# Patient Record
Sex: Female | Born: 1935 | Race: White | Hispanic: No | Smoking: Former smoker
Health system: Southern US, Community
[De-identification: ages and names within clinical notes are randomized; demographics above are authoritative.]

## PROBLEM LIST (undated history)

## (undated) DIAGNOSIS — T7840XA Allergy, unspecified, initial encounter: Secondary | ICD-10-CM

## (undated) DIAGNOSIS — E119 Type 2 diabetes mellitus without complications: Secondary | ICD-10-CM

## (undated) DIAGNOSIS — G4730 Sleep apnea, unspecified: Secondary | ICD-10-CM

## (undated) DIAGNOSIS — M1990 Unspecified osteoarthritis, unspecified site: Secondary | ICD-10-CM

## (undated) DIAGNOSIS — M109 Gout, unspecified: Secondary | ICD-10-CM

## (undated) DIAGNOSIS — F32A Depression, unspecified: Secondary | ICD-10-CM

## (undated) DIAGNOSIS — E039 Hypothyroidism, unspecified: Secondary | ICD-10-CM

## (undated) DIAGNOSIS — I509 Heart failure, unspecified: Secondary | ICD-10-CM

## (undated) DIAGNOSIS — F329 Major depressive disorder, single episode, unspecified: Secondary | ICD-10-CM

## (undated) DIAGNOSIS — K5792 Diverticulitis of intestine, part unspecified, without perforation or abscess without bleeding: Secondary | ICD-10-CM

## (undated) DIAGNOSIS — K635 Polyp of colon: Secondary | ICD-10-CM

## (undated) DIAGNOSIS — G510 Bell's palsy: Secondary | ICD-10-CM

## (undated) HISTORY — DX: Gout, unspecified: M10.9

## (undated) HISTORY — DX: Allergy, unspecified, initial encounter: T78.40XA

## (undated) HISTORY — PX: HERNIA REPAIR: SHX51

## (undated) HISTORY — DX: Sleep apnea, unspecified: G47.30

## (undated) HISTORY — PX: CHOLECYSTECTOMY: SHX55

## (undated) HISTORY — PX: ABDOMINAL HYSTERECTOMY: SHX81

## (undated) HISTORY — DX: Type 2 diabetes mellitus without complications: E11.9

## (undated) HISTORY — DX: Heart failure, unspecified: I50.9

---

## 2016-09-06 ENCOUNTER — Other Ambulatory Visit: Payer: Self-pay | Admitting: Geriatric Medicine

## 2016-09-06 DIAGNOSIS — Z1239 Encounter for other screening for malignant neoplasm of breast: Secondary | ICD-10-CM

## 2016-11-24 ENCOUNTER — Encounter: Payer: Self-pay | Admitting: Emergency Medicine

## 2016-11-24 ENCOUNTER — Inpatient Hospital Stay
Admission: EM | Admit: 2016-11-24 | Discharge: 2016-11-27 | DRG: 871 | Disposition: A | Discharge status: Nursing Home (Includes ICF) | Payer: Medicare Other | Attending: Internal Medicine | Admitting: Internal Medicine

## 2016-11-24 ENCOUNTER — Emergency Department: Payer: Medicare Other

## 2016-11-24 DIAGNOSIS — Z881 Allergy status to other antibiotic agents status: Secondary | ICD-10-CM

## 2016-11-24 DIAGNOSIS — E119 Type 2 diabetes mellitus without complications: Secondary | ICD-10-CM | POA: Diagnosis present

## 2016-11-24 DIAGNOSIS — R21 Rash and other nonspecific skin eruption: Secondary | ICD-10-CM | POA: Diagnosis present

## 2016-11-24 DIAGNOSIS — F329 Major depressive disorder, single episode, unspecified: Secondary | ICD-10-CM | POA: Diagnosis present

## 2016-11-24 DIAGNOSIS — Z888 Allergy status to other drugs, medicaments and biological substances status: Secondary | ICD-10-CM | POA: Diagnosis not present

## 2016-11-24 DIAGNOSIS — N179 Acute kidney failure, unspecified: Secondary | ICD-10-CM | POA: Diagnosis present

## 2016-11-24 DIAGNOSIS — E876 Hypokalemia: Secondary | ICD-10-CM | POA: Diagnosis present

## 2016-11-24 DIAGNOSIS — Z885 Allergy status to narcotic agent status: Secondary | ICD-10-CM

## 2016-11-24 DIAGNOSIS — I248 Other forms of acute ischemic heart disease: Secondary | ICD-10-CM | POA: Diagnosis present

## 2016-11-24 DIAGNOSIS — Y95 Nosocomial condition: Secondary | ICD-10-CM | POA: Diagnosis present

## 2016-11-24 DIAGNOSIS — J9601 Acute respiratory failure with hypoxia: Secondary | ICD-10-CM | POA: Diagnosis present

## 2016-11-24 DIAGNOSIS — R778 Other specified abnormalities of plasma proteins: Secondary | ICD-10-CM

## 2016-11-24 DIAGNOSIS — J189 Pneumonia, unspecified organism: Secondary | ICD-10-CM | POA: Diagnosis present

## 2016-11-24 DIAGNOSIS — E785 Hyperlipidemia, unspecified: Secondary | ICD-10-CM | POA: Diagnosis present

## 2016-11-24 DIAGNOSIS — T368X5A Adverse effect of other systemic antibiotics, initial encounter: Secondary | ICD-10-CM | POA: Diagnosis not present

## 2016-11-24 DIAGNOSIS — Z91041 Radiographic dye allergy status: Secondary | ICD-10-CM | POA: Diagnosis not present

## 2016-11-24 DIAGNOSIS — M6281 Muscle weakness (generalized): Secondary | ICD-10-CM

## 2016-11-24 DIAGNOSIS — Z87891 Personal history of nicotine dependence: Secondary | ICD-10-CM | POA: Diagnosis not present

## 2016-11-24 DIAGNOSIS — R7989 Other specified abnormal findings of blood chemistry: Secondary | ICD-10-CM

## 2016-11-24 DIAGNOSIS — E860 Dehydration: Secondary | ICD-10-CM | POA: Diagnosis present

## 2016-11-24 DIAGNOSIS — Z66 Do not resuscitate: Secondary | ICD-10-CM | POA: Diagnosis present

## 2016-11-24 DIAGNOSIS — A419 Sepsis, unspecified organism: Secondary | ICD-10-CM | POA: Diagnosis present

## 2016-11-24 DIAGNOSIS — E039 Hypothyroidism, unspecified: Secondary | ICD-10-CM | POA: Diagnosis present

## 2016-11-24 HISTORY — DX: Type 2 diabetes mellitus without complications: E11.9

## 2016-11-24 HISTORY — DX: Unspecified osteoarthritis, unspecified site: M19.90

## 2016-11-24 HISTORY — DX: Diverticulitis of intestine, part unspecified, without perforation or abscess without bleeding: K57.92

## 2016-11-24 LAB — COMPREHENSIVE METABOLIC PANEL
ALT: 23 U/L (ref 14–54)
AST: 45 U/L — AB (ref 15–41)
Albumin: 3.6 g/dL (ref 3.5–5.0)
Alkaline Phosphatase: 59 U/L (ref 38–126)
Anion gap: 11 (ref 5–15)
BILIRUBIN TOTAL: 1.2 mg/dL (ref 0.3–1.2)
BUN: 35 mg/dL — AB (ref 6–20)
CO2: 27 mmol/L (ref 22–32)
CREATININE: 1.42 mg/dL — AB (ref 0.44–1.00)
Calcium: 9.3 mg/dL (ref 8.9–10.3)
Chloride: 97 mmol/L — ABNORMAL LOW (ref 101–111)
GFR, EST AFRICAN AMERICAN: 39 mL/min — AB (ref >60)
GFR, EST NON AFRICAN AMERICAN: 34 mL/min — AB (ref >60)
Glucose, Bld: 256 mg/dL — ABNORMAL HIGH (ref 65–99)
POTASSIUM: 3.3 mmol/L — AB (ref 3.5–5.1)
Sodium: 135 mmol/L (ref 135–145)
TOTAL PROTEIN: 7.4 g/dL (ref 6.5–8.1)

## 2016-11-24 LAB — CBC WITH DIFFERENTIAL/PLATELET
Basophils Absolute: 0 10*3/uL (ref 0–0.1)
Basophils Relative: 1 %
EOS ABS: 0.1 10*3/uL (ref 0–0.7)
EOS PCT: 1 %
HCT: 37.4 % (ref 35.0–47.0)
Hemoglobin: 13.1 g/dL (ref 12.0–16.0)
LYMPHS ABS: 1 10*3/uL (ref 1.0–3.6)
Lymphocytes Relative: 17 %
MCH: 31.2 pg (ref 26.0–34.0)
MCHC: 34.9 g/dL (ref 32.0–36.0)
MCV: 89.2 fL (ref 80.0–100.0)
MONO ABS: 0.8 10*3/uL (ref 0.2–0.9)
MONOS PCT: 12 %
Neutro Abs: 4.2 10*3/uL (ref 1.4–6.5)
Neutrophils Relative %: 69 %
PLATELETS: 126 10*3/uL — AB (ref 150–440)
RBC: 4.19 MIL/uL (ref 3.80–5.20)
RDW: 16 % — AB (ref 11.5–14.5)
WBC: 6.1 10*3/uL (ref 3.6–11.0)

## 2016-11-24 LAB — PROTIME-INR
INR: 1.2
Prothrombin Time: 15.3 seconds — ABNORMAL HIGH (ref 11.4–15.2)

## 2016-11-24 LAB — LIPASE, BLOOD: LIPASE: 18 U/L (ref 11–51)

## 2016-11-24 LAB — INFLUENZA PANEL BY PCR (TYPE A & B)
Influenza A By PCR: NEGATIVE
Influenza B By PCR: NEGATIVE

## 2016-11-24 LAB — LACTIC ACID, PLASMA: Lactic Acid, Venous: 1.2 mmol/L (ref 0.5–1.9)

## 2016-11-24 LAB — TROPONIN I: TROPONIN I: 0.06 ng/mL — AB (ref <0.03)

## 2016-11-24 LAB — GLUCOSE, CAPILLARY: Glucose-Capillary: 205 mg/dL — ABNORMAL HIGH (ref 65–99)

## 2016-11-24 MED ORDER — SENNOSIDES-DOCUSATE SODIUM 8.6-50 MG PO TABS: 1.0000 | ORAL_TABLET | Freq: Every evening | ORAL | Status: DC | PRN

## 2016-11-24 MED ORDER — SODIUM CHLORIDE 0.9 % IV BOLUS (SEPSIS)
250.0000 mL | Freq: Once | INTRAVENOUS | Status: AC
  Administered 2016-11-24: 250 mL via INTRAVENOUS

## 2016-11-24 MED ORDER — ACETAMINOPHEN 500 MG PO TABS
1000.0000 mg | ORAL_TABLET | Freq: Once | ORAL | Status: AC
  Administered 2016-11-24: 1000 mg via ORAL

## 2016-11-24 MED ORDER — POTASSIUM CHLORIDE 20 MEQ/15ML (10%) PO SOLN
40.0000 meq | Freq: Once | ORAL | Status: DC
  Filled 2016-11-24: qty 30

## 2016-11-24 MED ORDER — ACETAMINOPHEN 650 MG RE SUPP: 650.0000 mg | Freq: Four times a day (QID) | RECTAL | Status: DC | PRN

## 2016-11-24 MED ORDER — VANCOMYCIN HCL IN DEXTROSE 1-5 GM/200ML-% IV SOLN
1000.0000 mg | Freq: Once | INTRAVENOUS | Status: AC
  Administered 2016-11-24: 1000 mg via INTRAVENOUS
  Filled 2016-11-24: qty 200

## 2016-11-24 MED ORDER — ENOXAPARIN SODIUM 30 MG/0.3ML ~~LOC~~ SOLN
30.0000 mg | SUBCUTANEOUS | Status: DC
  Administered 2016-11-24: 30 mg via SUBCUTANEOUS
  Filled 2016-11-24: qty 0.3

## 2016-11-24 MED ORDER — SODIUM CHLORIDE 0.9 % IV SOLN
INTRAVENOUS | Status: DC
  Administered 2016-11-25 (×2): via INTRAVENOUS

## 2016-11-24 MED ORDER — DEXTROSE 5 % IV SOLN
1.0000 g | Freq: Three times a day (TID) | INTRAVENOUS | Status: DC
  Administered 2016-11-25: 1 g via INTRAVENOUS
  Filled 2016-11-24 (×3): qty 1

## 2016-11-24 MED ORDER — SODIUM CHLORIDE 0.9 % IV BOLUS (SEPSIS)
1000.0000 mL | Freq: Once | INTRAVENOUS | Status: AC
  Administered 2016-11-24: 1000 mL via INTRAVENOUS

## 2016-11-24 MED ORDER — ACETAMINOPHEN 325 MG PO TABS
650.0000 mg | ORAL_TABLET | Freq: Four times a day (QID) | ORAL | Status: DC | PRN
Start: 2016-11-24 — End: 2016-11-27
  Administered 2016-11-25 – 2016-11-27 (×4): 650 mg via ORAL
  Filled 2016-11-24 (×4): qty 2

## 2016-11-24 MED ORDER — FENOFIBRATE 160 MG PO TABS
160.0000 mg | ORAL_TABLET | Freq: Every day | ORAL | Status: DC
  Administered 2016-11-25 – 2016-11-27 (×3): 160 mg via ORAL
  Filled 2016-11-24 (×3): qty 1

## 2016-11-24 MED ORDER — LEVOTHYROXINE SODIUM 50 MCG PO TABS
50.0000 ug | ORAL_TABLET | Freq: Every day | ORAL | Status: DC
  Administered 2016-11-25 – 2016-11-27 (×3): 50 ug via ORAL
  Filled 2016-11-24 (×3): qty 1

## 2016-11-24 MED ORDER — SODIUM CHLORIDE 0.9 % IV BOLUS (SEPSIS)
1000.0000 mL | Freq: Once | INTRAVENOUS | Status: AC
Start: 2016-11-24 — End: 2016-11-24
  Administered 2016-11-24: 1000 mL via INTRAVENOUS

## 2016-11-24 MED ORDER — ALBUTEROL SULFATE (2.5 MG/3ML) 0.083% IN NEBU
5.0000 mg | INHALATION_SOLUTION | Freq: Once | RESPIRATORY_TRACT | Status: AC
  Administered 2016-11-24: 5 mg via RESPIRATORY_TRACT
  Filled 2016-11-24: qty 6

## 2016-11-24 MED ORDER — TRAMADOL HCL 50 MG PO TABS
50.0000 mg | ORAL_TABLET | Freq: Four times a day (QID) | ORAL | Status: DC | PRN
  Administered 2016-11-25 – 2016-11-26 (×2): 50 mg via ORAL
  Filled 2016-11-24 (×2): qty 1

## 2016-11-24 MED ORDER — DEXTROSE 5 % IV SOLN
500.0000 mg | Freq: Once | INTRAVENOUS | Status: AC
  Administered 2016-11-24: 500 mg via INTRAVENOUS
  Filled 2016-11-24: qty 500

## 2016-11-24 MED ORDER — ONDANSETRON HCL 4 MG PO TABS
4.0000 mg | ORAL_TABLET | Freq: Four times a day (QID) | ORAL | Status: DC | PRN
Start: 2016-11-24 — End: 2016-11-27

## 2016-11-24 MED ORDER — DULOXETINE HCL 60 MG PO CPEP
60.0000 mg | ORAL_CAPSULE | Freq: Every day | ORAL | Status: DC
  Administered 2016-11-25 – 2016-11-27 (×3): 60 mg via ORAL
  Filled 2016-11-24 (×3): qty 1

## 2016-11-24 MED ORDER — ONDANSETRON HCL 4 MG/2ML IJ SOLN: 4.0000 mg | Freq: Four times a day (QID) | INTRAMUSCULAR | Status: DC | PRN

## 2016-11-24 MED ORDER — INSULIN DETEMIR 100 UNIT/ML ~~LOC~~ SOLN
20.0000 [IU] | Freq: Every day | SUBCUTANEOUS | Status: DC
  Administered 2016-11-24 – 2016-11-26 (×3): 20 [IU] via SUBCUTANEOUS
  Filled 2016-11-24 (×4): qty 0.2

## 2016-11-24 MED ORDER — ACETAMINOPHEN 500 MG PO TABS
ORAL_TABLET | ORAL | Status: AC
  Administered 2016-11-24: 1000 mg via ORAL
  Filled 2016-11-24: qty 2

## 2016-11-24 MED ORDER — DEXTROSE 5 % IV SOLN
2.0000 g | Freq: Once | INTRAVENOUS | Status: AC
  Administered 2016-11-24: 2 g via INTRAVENOUS
  Filled 2016-11-24: qty 2

## 2016-11-24 NOTE — Progress Notes (Signed)
Pharmacy Antibiotic Note  Sabrina Small is a 82 y.o. female admitted on 11/24/2016 with pneumonia.  Pharmacy has been consulted for aztreonam dosing.  Plan: Aztreonam 2 g IV once given in ED  Will order aztreonam 1 g IV q8h per renal function and indication  Height: 4\' 11"  (149.9 cm) Weight: 163 lb 11.2 oz (74.3 kg) IBW/kg (Calculated) : 43.2  Temp (24hrs), Avg:99.7 F (37.6 C), Min:98.2 F (36.8 C), Max:101.9 F (38.8 C)   Recent Labs Lab 11/24/16 1613  WBC 6.1  CREATININE 1.42*  LATICACIDVEN 1.2    Estimated Creatinine Clearance: 27.3 mL/min (by C-G formula based on SCr of 1.42 mg/dL (H)).    Allergies  Allergen Reactions  . Doxycycline   . Glipizide   . Iodinated Diagnostic Agents   . Lortab [Hydrocodone-Acetaminophen]   . Morphine And Related   . Omnicef [Cefdinir]   . Penicillins Other (See Comments)    "I blow up and pass out" Over 10 yrs ago, thinks she did go to the hospital, but didn't remember if she was admitted. No skin necrosis.   Marland Kitchen Percocet [Oxycodone-Acetaminophen]   . Rosuvastatin   . Sulfa Antibiotics   . Trovafloxacin   . Voltaren [Diclofenac Sodium]   . Zolpidem    Microbiology results: 12/20 BCx: Sent 12/20 UCx: Sent    Thank you for allowing pharmacy to be a part of this patient's care.  Lenis Noon, PharmD, BCPS Clinical Pharmacist 11/24/2016 8:03 PM

## 2016-11-24 NOTE — ED Provider Notes (Signed)
[facility] Emergency Department Provider Note  ____________________________________________   First MD Initiated Contact with Patient 11/24/16 1546     (approximate)  I have reviewed the triage vital signs and the nursing notes.   HISTORY  Chief Complaint Shortness of Breath    HPI Sabrina Small is a 82 y.o. female who presents for evaluation of fever, cough, and shortness of breath that has been gradually worsening for 4 days.  The symptoms are now severe in any amount of exertion makes it worse.  Nothing makes it better.  She states that it started with more of an upper respiratory issue with nasal congestion and runny nose and a mild sore throat.  Those symptoms have improved but now she has difficulty breathing, productive cough, and fever/chills.  He was seen by doctors making house calls last night and antibiotics were prescribed but she has not started on any yet.  Her symptoms were worse today and the patient has a lot of listed allergies to most types of antibiotics so that was one of the hesitations for starting her on antibiotics as an outpatient.  Reportedly the pneumonia was documented on a chest x-ray performed last night.  She denies chest pain except for when she coughs, abdominal pain, nausea, vomiting, dysuria.   Past Medical History:  Diagnosis Date  . Arthritis   . Diabetes mellitus without complication ([place])   . Diverticulitis     Patient Active Problem List   Diagnosis Date Noted  . Sepsis ([place]) 11/24/2016    Past Surgical History:  Procedure Laterality Date  . ABDOMINAL HYSTERECTOMY    . CHOLECYSTECTOMY      Prior to Admission medications   Not on File    Allergies Doxycycline; Glipizide; Iodinated diagnostic agents; Lortab [hydrocodone-acetaminophen]; Morphine and related; Omnicef [cefdinir]; Penicillins; Percocet [oxycodone-acetaminophen]; Rosuvastatin; Sulfa antibiotics; Trovafloxacin; Voltaren [diclofenac sodium];  and Zolpidem  No family history on file.  Social History Social History  Substance Use Topics  . Smoking status: Former Research scientist (life sciences)  . Smokeless tobacco: Never Used  . Alcohol use No    Review of Systems Constitutional: +fever/chills Eyes: No visual changes. ENT: No sore throat. Cardiovascular: Denies chest pain. Respiratory: +shortness of breath with productive cough Gastrointestinal: No abdominal pain.  No nausea, no vomiting.  No diarrhea.  No constipation. Genitourinary: Negative for dysuria. Musculoskeletal: Negative for back pain. Skin: Negative for rash. Neurological: Negative for headaches, focal weakness or numbness.  10-point ROS otherwise negative.  ____________________________________________   PHYSICAL EXAM:  VITAL SIGNS: ED Triage Vitals  Enc Vitals Group     BP 11/24/16 1529 138/62     Pulse Rate 11/24/16 1529 94     Resp 11/24/16 1529 20     Temp 11/24/16 1529 (!) 101.9 F (38.8 C)     Temp Source 11/24/16 1529 Oral     SpO2 11/24/16 1529 91 %     Weight 11/24/16 1521 154 lb (69.9 kg)     Height 11/24/16 1521 4\' 11"  (1.499 m)     Head Circumference --      Peak Flow --      Pain Score 11/24/16 1521 8     Pain Loc --      Pain Edu? --      Excl. in [place]? --     Constitutional: Alert and oriented but ill-appearing Eyes: Conjunctivae are normal. PERRL. EOMI. Head: Atraumatic. Nose: No congestion/rhinnorhea. Mouth/Throat: Mucous membranes are moist.  Oropharynx non-erythematous. Neck: No stridor.  No meningeal signs.   Cardiovascular: Normal rate, regular rhythm. Good peripheral circulation. Grossly normal heart sounds. Respiratory: Increased respiratory effort.  No retractions.  Coarse breath sounds bilaterally, slightly worse on the right.  Frequent productive cough Gastrointestinal: Soft and nontender. No distention.  Musculoskeletal: No lower extremity tenderness nor edema. No gross deformities of extremities. Neurologic:  Normal speech and  language. No gross focal neurologic deficits are appreciated.  Skin:  Skin is warm, dry and intact. No rash noted. Psychiatric: Mood and affect are normal. Speech and behavior are normal.  ____________________________________________   LABS (all labs ordered are listed, but only abnormal results are displayed)  Labs Reviewed  COMPREHENSIVE METABOLIC PANEL - Abnormal; Notable for the following:       Result Value   Potassium 3.3 (*)    Chloride 97 (*)    Glucose, Bld 256 (*)    BUN 35 (*)    Creatinine, Ser 1.42 (*)    AST 45 (*)    GFR calc non Af Amer 34 (*)    GFR calc Af Amer 39 (*)    All other components within normal limits  PROTIME-INR - Abnormal; Notable for the following:    Prothrombin Time 15.3 (*)    All other components within normal limits  CBC WITH DIFFERENTIAL/PLATELET - Abnormal; Notable for the following:    RDW 16.0 (*)    Platelets 126 (*)    All other components within normal limits  TROPONIN I - Abnormal; Notable for the following:    Troponin I 0.06 (*)    All other components within normal limits  CULTURE, BLOOD (ROUTINE X 2)  CULTURE, BLOOD (ROUTINE X 2)  URINE CULTURE  LACTIC ACID, PLASMA  LIPASE, BLOOD  LACTIC ACID, PLASMA  INFLUENZA PANEL BY PCR (TYPE A & B, H1N1)   ____________________________________________  EKG  ED ECG REPORT I, Kharizma Lesnick, the attending physician, personally viewed and interpreted this ECG.  Date: 11/24/2016 EKG Time: 15:38 Rate: 93 Rhythm: normal sinus rhythm with occasional PVC QRS Axis: normal Intervals: normal ST/T Wave abnormalities: Non-specific ST segment / T-wave changes, but no evidence of acute ischemia. Conduction Disturbances: none Narrative Interpretation: unremarkable  ____________________________________________  RADIOLOGY   Dg Chest 2 View  Result Date: 11/24/2016 CLINICAL DATA:  Shortness of breath and cough for the past week ; history of diabetes; former smoker. EXAM: CHEST  2 VIEW  COMPARISON:  None in PACs FINDINGS: The lungs are adequately inflated. The interstitial markings are coarse. Confluent interstitial density is present at the right lung base likely posteriorly. There is a small amount of pleural fluid blunting the costophrenic angles. The cardiac silhouette is enlarged. The central pulmonary vascularity is prominent. There is calcification in the wall of the aortic arch. There is multilevel degenerative disc disease of the thoracic spine. IMPRESSION: Chronic bronchitic -smoking related changes. Superimposed interstitial and early alveolar pneumonia especially in the right lower lobe. Followup PA and lateral chest X-ray is recommended in 3-4 weeks following trial of antibiotic therapy to ensure resolution and exclude underlying malignancy. Cardiomegaly with mild central pulmonary vascular prominence but no definite pulmonary edema. Thoracic aortic atherosclerosis. Electronically Signed   By: David  Martinique M.D.   On: 11/24/2016 16:14    ____________________________________________   PROCEDURES  Procedure(s) performed:   .Critical Care Performed by: Hinda Kehr Authorized by: Hinda Kehr   Critical care provider statement:    Critical care time (minutes):  30   Critical care time was exclusive of:  Separately  billable procedures and treating other patients   Critical care was necessary to treat or prevent imminent or life-threatening deterioration of the following conditions:  Sepsis   Critical care was time spent personally by me on the following activities:  Development of treatment plan with patient or surrogate, discussions with consultants, evaluation of patient's response to treatment, examination of patient, obtaining history from patient or surrogate, ordering and performing treatments and interventions, ordering and review of laboratory studies, ordering and review of radiographic studies, pulse oximetry, re-evaluation of patient's condition and review of  old charts     Critical Care performed: Yes, see critical care procedure note(s) ____________________________________________   INITIAL IMPRESSION / [place] / ED COURSE  Pertinent labs & imaging results that were available during my care of the patient were reviewed by me and considered in my medical decision making (see chart for details).  Based on the patient's vital signs I activated code sepsis.  We will proceed with empiric antibiotics though she has a number of listed allergies.  She is not certain about any of the interactions etc. she was certain the penicillin was severe and cause swelling.  I will give her the penicillin allergic alternative regimen per the sepsis order set.  She is hypoxemic at 88% on room air so she is now on 2 L of oxygen.   Clinical Course as of Nov 24 1749  Wed Nov 24, 2016  1632 CXR consistent with PNA DG Chest 2 View [CF]  1711 Positive troponin, likely demand ischemia.  Slightly elevated Cr at 1.42.  Hyperglycemia.  Otherwise unremarkable, even without leukocytosis.  Normal lactate.  Will slow down fluids rather than giving full 32mL/kg since no sign of severe sepsis, and admit given septic criteria, PNA on CXR, +troponin, and hypoxemia with O2 requirement.    [CF]    Clinical Course User Index [CF] Hinda Kehr, MD    ____________________________________________  FINAL CLINICAL IMPRESSION(S) / ED DIAGNOSES  Final diagnoses:  Community acquired pneumonia of right lung, unspecified part of lung  Acute respiratory failure with hypoxemia ([place])  Demand ischemia ([place])  Elevated troponin I level     MEDICATIONS GIVEN DURING THIS VISIT:  Medications  sodium chloride 0.9 % bolus 1,000 mL (0 mLs Intravenous Stopped 11/24/16 1740)    And  sodium chloride 0.9 % bolus 1,000 mL (0 mLs Intravenous Stopped 11/24/16 1746)    And  sodium chloride 0.9 % bolus 250 mL ( Intravenous Canceled Entry 11/24/16 1740)  aztreonam (AZACTAM) 2 g in  dextrose 5 % 50 mL IVPB (not administered)  albuterol (PROVENTIL) (2.5 MG/3ML) 0.083% nebulizer solution 5 mg (5 mg Nebulization Given 11/24/16 1622)  azithromycin (ZITHROMAX) 500 mg in dextrose 5 % 250 mL IVPB (500 mg Intravenous New Bag/Given 11/24/16 1647)  vancomycin (VANCOCIN) IVPB 1000 mg/200 mL premix (0 mg Intravenous Stopped 11/24/16 1739)  acetaminophen (TYLENOL) tablet 1,000 mg (1,000 mg Oral Given 11/24/16 1705)     NEW OUTPATIENT MEDICATIONS STARTED DURING THIS VISIT:  New Prescriptions   No medications on file    Modified Medications   No medications on file    Discontinued Medications   No medications on file     Note:  This document was prepared using Dragon voice recognition software and may include unintentional dictation errors.    Hinda Kehr, MD 11/24/16 [id number]

## 2016-11-24 NOTE — H&P (Addendum)
[facility] [place] NAME: Sabrina Small    MR#:  [id number]  DATE OF BIRTH:  07-Dec-1932  DATE OF ADMISSION:  11/24/2016  PRIMARY CARE PHYSICIAN: dr making house calls   REQUESTING/REFERRING PHYSICIAN: Dr Karma Greaser  CHIEF COMPLAINT:   SOB and weakness HISTORY OF PRESENT ILLNESS:  Sabrina Small  is a 82 y.o. female with a known history of Diabetes who presents from [facility] with above complaint. Patient was brought in by her daughter who was concerned due to patient's weakness and poor appetite over the past several days. She was seen by her PCP, doctors making house calls yesterday. Chest x-ray was obtained which showed pneumonia. An antibiotic was called in however when patient's daughter went to pick it up at the pharmacy it was taking of very long time due to her multiple allergies to make sure patient would not have a side effect from this antibiotic. Her daughter got anxious and was told by the pharmacist to bring patient into the ER for further evaluation. In the emergency room chest x-ray is consistent with pneumonia and she is found to have hypoxia. Patient has been under a tremendous amount of stress and depression after the death of her husband who was buried last week. While in the emergency room she was ordered vancomycin, history on him and azithromycin. It appears that she is having a rash on the right arm where vancomycin is being infused and therefore vancomycin has now been discontinued.  PAST MEDICAL HISTORY:   Past Medical History:  Diagnosis Date  . Arthritis   . Diabetes mellitus without complication ([place])   . Diverticulitis     PAST SURGICAL HISTORY:   Past Surgical History:  Procedure Laterality Date  . ABDOMINAL HYSTERECTOMY    . CHOLECYSTECTOMY      SOCIAL HISTORY:   Social History  Substance Use Topics  . Smoking status: Former Research scientist (life sciences)  . Smokeless tobacco: Never Used  . Alcohol use No    FAMILY HISTORY:     DRUG ALLERGIES:   Allergies  Allergen Reactions  . Doxycycline   . Glipizide   . Iodinated Diagnostic Agents   . Lortab [Hydrocodone-Acetaminophen]   . Morphine And Related   . Omnicef [Cefdinir]   . Penicillins   . Percocet [Oxycodone-Acetaminophen]   . Rosuvastatin   . Sulfa Antibiotics   . Trovafloxacin   . Voltaren [Diclofenac Sodium]   . Zolpidem     REVIEW OF SYSTEMS:   Review of Systems  Constitutional: Positive for chills, fever and malaise/fatigue.  HENT: Negative.  Negative for ear discharge, ear pain, hearing loss, nosebleeds and sore throat.   Eyes: Negative.  Negative for blurred vision and pain.  Respiratory: Positive for cough, sputum production, shortness of breath and wheezing. Negative for hemoptysis.   Cardiovascular: Negative.  Negative for chest pain, palpitations and leg swelling.  Gastrointestinal: Negative.  Negative for abdominal pain, blood in stool, diarrhea, nausea and vomiting.  Genitourinary: Negative.  Negative for dysuria.  Musculoskeletal: Negative.  Negative for back pain.  Skin: Negative.   Neurological: Positive for weakness. Negative for dizziness, tremors, speech change, focal weakness, seizures and headaches.  Endo/Heme/Allergies: Negative.  Does not bruise/bleed easily.  Psychiatric/Behavioral: Positive for depression. Negative for hallucinations and suicidal ideas.    MEDICATIONS AT HOME:   Levemir 40 units at bedtime   Cymbalta 60 daily  Fenofibrate 200 mg daily  Synthroid 50 mg daily  Livalo 1 mg daily  Torsemide  100 mg daily       VITAL SIGNS:  Blood pressure (!) 148/62, pulse 89, temperature 99.1 F (37.3 C), temperature source Oral, resp. rate 19, height 4\' 11"  (1.499 m), weight 69.9 kg (154 lb), SpO2 (!) 88 %.  PHYSICAL EXAMINATION:   Physical Exam  Constitutional: She is oriented to person, place, and time and well-developed, well-nourished, and in no distress. No distress.  Very weak appearing  HENT:   Head: Normocephalic.  Eyes: No scleral icterus.  Neck: Normal range of motion. Neck supple. No JVD present. No tracheal deviation present.  Cardiovascular: Normal rate, regular rhythm and normal heart sounds.  Exam reveals no gallop and no friction rub.   No murmur heard. Pulmonary/Chest: Effort normal and breath sounds normal. No respiratory distress. She has no wheezes. She has no rales. She exhibits no tenderness.  Bilateral coarse wheezing with rhonchi  Abdominal: Soft. Bowel sounds are normal. She exhibits no distension and no mass. There is no tenderness. There is no rebound and no guarding.  Musculoskeletal: Normal range of motion. She exhibits no edema.  Neurological: She is alert and oriented to person, place, and time.  Skin: Skin is warm. No rash noted. No erythema.  Psychiatric: Affect normal.      LABORATORY PANEL:   CBC  Recent Labs Lab 11/24/16 1613  WBC 6.1  HGB 13.1  HCT 37.4  PLT 126*   ------------------------------------------------------------------------------------------------------------------  Chemistries   Recent Labs Lab 11/24/16 1613  NA 135  K 3.3*  CL 97*  CO2 27  GLUCOSE 256*  BUN 35*  CREATININE 1.42*  CALCIUM 9.3  AST 45*  ALT 23  ALKPHOS 59  BILITOT 1.2   ------------------------------------------------------------------------------------------------------------------  Cardiac Enzymes  Recent Labs Lab 11/24/16 1613  TROPONINI 0.06*   ------------------------------------------------------------------------------------------------------------------  RADIOLOGY:  Dg Chest 2 View  Result Date: 11/24/2016 CLINICAL DATA:  Shortness of breath and cough for the past week ; history of diabetes; former smoker. EXAM: CHEST  2 VIEW COMPARISON:  None in PACs FINDINGS: The lungs are adequately inflated. The interstitial markings are coarse. Confluent interstitial density is present at the right lung base likely posteriorly. There is  a small amount of pleural fluid blunting the costophrenic angles. The cardiac silhouette is enlarged. The central pulmonary vascularity is prominent. There is calcification in the wall of the aortic arch. There is multilevel degenerative disc disease of the thoracic spine. IMPRESSION: Chronic bronchitic -smoking related changes. Superimposed interstitial and early alveolar pneumonia especially in the right lower lobe. Followup PA and lateral chest X-ray is recommended in 3-4 weeks following trial of antibiotic therapy to ensure resolution and exclude underlying malignancy. Cardiomegaly with mild central pulmonary vascular prominence but no definite pulmonary edema. Thoracic aortic atherosclerosis. Electronically Signed   By: David  Martinique M.D.   On: 11/24/2016 16:14    EKG:   Sinus tachycardia with PVC  IMPRESSION AND PLAN:   82 year old female with a history of diabetes and depression with recent loss of her husband who presents from assisted living with acute hypoxic respiratory failure due to health care acquired pneumonia.  1. Acute hypoxic respiratory failure in the setting of pneumonia with sepsis: Continue aztreonam. She had a localized reaction to vancomycin. Follow up on blood cultures Wean oxygen  2. Healthcare acquired pneumonia: Continue plan as outlined above She needs a repeat CXR in 3 weeks. I have ordered speech evaluation as well due to location of Pneumonia.   3. Diabetes: Due to poor appetite I will start with  sliding scale insulin and half of her dose of Levemir.  4. Hyperlipidemia: Continue fenofibrate and Livalo if available by pharmacy here. She has allergies Rovastatin.  5. Hypothyroidism: Continue Synthroid  6. Depression: Continue Cymbalta  7. Hypokalemia: Replace and check in am   All the records are reviewed and case discussed with ED provider. Management plans discussed with the patient and daughter in agreement  CODE STATUS: DNR  TOTAL TIME TAKING  CARE OF THIS PATIENT: 45 minutes.    Mariene Dickerman M.D on 11/24/2016 at 5:31 PM  Between 7am to 6pm - Pager - [phone]  After 6pm go to [email or web address] - password [email or web address] [facility]  Office  [phone]  CC: Primary care physician; No PCP Per Patient

## 2016-11-24 NOTE — ED Triage Notes (Signed)
Pt comes into the ED via POV with her daughter c/o productive cough, overall weakness, and chest pressure.  Patient resides at [facility] where they did a chest x-ray and diagnosed the patient with pneumonia yesterday.  Patient was supposed to start her antibiotic today but they gave her the wrong script.  Physicians told her to come here.  Patient presents with symmetrical breathing and NAD at this time.

## 2016-11-24 NOTE — ED Notes (Signed)
All fluid boluses stopped per MD Mody. PAtient received 1L altogether

## 2016-11-24 NOTE — ED Notes (Signed)
Report attempted. Relayed to this RN they are in the middle of report and will call back when done

## 2016-11-24 NOTE — ED Notes (Signed)
Patient placed on 2L O2 

## 2016-11-24 NOTE — Progress Notes (Signed)
Family Meeting Note  Advance Directive:yes  Today a meeting took place with the Patient.and daughter at bedside, [name]     The following clinical team members were present during this meeting:MD and RN in ED  The following were discussed:Patient's diagnosis: , Patient's progosis: > 12 months and Goals for treatment: DNR  Additional follow-up to be provided: patient family to being in [place] papers  Time spent during discussion:16 minutes  Jonette Wassel, Ulice Bold, MD

## 2016-11-24 NOTE — Progress Notes (Signed)
Anticoagulation monitoring(Lovenox):  82 yo female ordered Lovenox 40 mg Q24h  Filed Weights   11/24/16 1521 11/24/16 1941  Weight: 154 lb (69.9 kg) 163 lb 11.2 oz (74.3 kg)   BMI    Lab Results  Component Value Date   CREATININE 1.42 (H) 11/24/2016   Estimated Creatinine Clearance: 27.3 mL/min (by C-G formula based on SCr of 1.42 mg/dL (H)). Hemoglobin & Hematocrit     Component Value Date/Time   HGB 13.1 11/24/2016 1613   HCT 37.4 11/24/2016 1613     Per Protocol for Patient with estCrcl < 30 ml/min and BMI < 40, will transition to Lovenox 30 mg Q24h.

## 2016-11-24 NOTE — ED Notes (Addendum)
Allergic reaction noted to Vancomycin. Red whelps noted to R arm with itching. Per MD Mody Vancomycin stopped

## 2016-11-25 LAB — BASIC METABOLIC PANEL
Anion gap: 8 (ref 5–15)
BUN: 29 mg/dL — ABNORMAL HIGH (ref 6–20)
CALCIUM: 8.5 mg/dL — AB (ref 8.9–10.3)
CO2: 27 mmol/L (ref 22–32)
CREATININE: 0.99 mg/dL (ref 0.44–1.00)
Chloride: 103 mmol/L (ref 101–111)
GFR calc Af Amer: >60 mL/min (ref >60)
GFR, EST NON AFRICAN AMERICAN: 52 mL/min — AB (ref >60)
GLUCOSE: 147 mg/dL — AB (ref 65–99)
Potassium: 3 mmol/L — ABNORMAL LOW (ref 3.5–5.1)
Sodium: 138 mmol/L (ref 135–145)

## 2016-11-25 LAB — CBC
HCT: 33.7 % — ABNORMAL LOW (ref 35.0–47.0)
Hemoglobin: 11.6 g/dL — ABNORMAL LOW (ref 12.0–16.0)
MCH: 30.7 pg (ref 26.0–34.0)
MCHC: 34.3 g/dL (ref 32.0–36.0)
MCV: 89.5 fL (ref 80.0–100.0)
PLATELETS: 108 10*3/uL — AB (ref 150–440)
RBC: 3.77 MIL/uL — ABNORMAL LOW (ref 3.80–5.20)
RDW: 15.6 % — AB (ref 11.5–14.5)
WBC: 5 10*3/uL (ref 3.6–11.0)

## 2016-11-25 LAB — MAGNESIUM: Magnesium: 1.7 mg/dL (ref 1.7–2.4)

## 2016-11-25 LAB — GLUCOSE, CAPILLARY
GLUCOSE-CAPILLARY: 149 mg/dL — AB (ref 65–99)
GLUCOSE-CAPILLARY: 109 mg/dL — AB (ref 65–99)
Glucose-Capillary: 148 mg/dL — ABNORMAL HIGH (ref 65–99)
Glucose-Capillary: 153 mg/dL — ABNORMAL HIGH (ref 65–99)

## 2016-11-25 LAB — MRSA PCR SCREENING: MRSA BY PCR: NEGATIVE

## 2016-11-25 MED ORDER — POTASSIUM CHLORIDE 20 MEQ/15ML (10%) PO SOLN
60.0000 meq | Freq: Once | ORAL | Status: AC
  Administered 2016-11-25: 60 meq via ORAL

## 2016-11-25 MED ORDER — INSULIN ASPART 100 UNIT/ML ~~LOC~~ SOLN
0.0000 [IU] | Freq: Three times a day (TID) | SUBCUTANEOUS | Status: DC
  Administered 2016-11-25: 2 [IU] via SUBCUTANEOUS
  Administered 2016-11-25 (×2): 1 [IU] via SUBCUTANEOUS
  Filled 2016-11-25: qty 1
  Filled 2016-11-25: qty 2
  Filled 2016-11-25: qty 1

## 2016-11-25 MED ORDER — DEXTROSE 5 % IV SOLN
2.0000 g | Freq: Three times a day (TID) | INTRAVENOUS | Status: DC
  Administered 2016-11-25 – 2016-11-27 (×6): 2 g via INTRAVENOUS
  Filled 2016-11-25 (×10): qty 2

## 2016-11-25 MED ORDER — ALLOPURINOL 100 MG PO TABS
100.0000 mg | ORAL_TABLET | Freq: Every day | ORAL | Status: DC
  Administered 2016-11-25 – 2016-11-27 (×3): 100 mg via ORAL
  Filled 2016-11-25 (×3): qty 1

## 2016-11-25 MED ORDER — MAGNESIUM OXIDE 400 (241.3 MG) MG PO TABS
400.0000 mg | ORAL_TABLET | Freq: Two times a day (BID) | ORAL | Status: AC
  Administered 2016-11-25 (×2): 400 mg via ORAL
  Filled 2016-11-25 (×2): qty 1

## 2016-11-25 MED ORDER — PREGABALIN 75 MG PO CAPS
150.0000 mg | ORAL_CAPSULE | Freq: Two times a day (BID) | ORAL | Status: DC
  Administered 2016-11-25 – 2016-11-27 (×4): 150 mg via ORAL
  Filled 2016-11-25 (×4): qty 2

## 2016-11-25 MED ORDER — INSULIN ASPART 100 UNIT/ML ~~LOC~~ SOLN
0.0000 [IU] | Freq: Every day | SUBCUTANEOUS | Status: DC
Start: 2016-11-25 — End: 2016-11-27

## 2016-11-25 MED ORDER — GUAIFENESIN-DM 100-10 MG/5ML PO SYRP
5.0000 mL | ORAL_SOLUTION | ORAL | Status: DC | PRN
  Administered 2016-11-25 – 2016-11-26 (×4): 5 mL via ORAL
  Filled 2016-11-25 (×4): qty 5

## 2016-11-25 MED ORDER — ENOXAPARIN SODIUM 40 MG/0.4ML ~~LOC~~ SOLN
40.0000 mg | SUBCUTANEOUS | Status: DC
  Administered 2016-11-25 – 2016-11-26 (×2): 40 mg via SUBCUTANEOUS
  Filled 2016-11-25 (×3): qty 0.4

## 2016-11-25 NOTE — Care Management (Signed)
[facility] is following at [facility]

## 2016-11-25 NOTE — Progress Notes (Signed)
The Pt was recommended to [facility] by Roderic Palau. [place] visited the Pt, Pt talked about her struggles, loss, relationship with children and financial needs. Pt showed signs of grief and slight depression, but was still hopeful. Pt requested for prayers, which the [facility] provided.    11/25/16 1600  Clinical Encounter Type  Visited With Patient  Visit Type Initial;Spiritual support  Referral From Chaplain  Consult/Referral To Chaplain  Spiritual Encounters  Spiritual Needs Prayer;Emotional;Other (Comment)

## 2016-11-25 NOTE — Evaluation (Signed)
Physical Therapy Evaluation Patient Details Name: Sabrina Small MRN: [id number] DOB: [birth year] Today's Date: 11/25/2016   History of Present Illness  Pt is an 82 y.o. female with a known history of Diabetes who presents from [facility] with above complaint. Patient was brought in by her daughter who was concerned due to patient's weakness and poor appetite over the past several days. She was seen by her PCP, doctors making house calls yesterday. Chest x-ray was obtained which showed pneumonia. An antibiotic was called in however when patient's daughter went to pick it up at the pharmacy it was taking of very long time due to her multiple allergies to make sure patient would not have a side effect from this antibiotic. Her daughter got anxious and was told by the pharmacist to bring patient into the ER for further evaluation. In the emergency room chest x-ray is consistent with pneumonia and she is found to have hypoxia.  Clinical Impression  Pt presented with deficits in strength, transfers, mobility, gait, balance, and activity tolerance.  Pt was SBA with extra time and effort required for all bed mobility tasks.  Pt was SBA with transfers and ambulated 30' with RW and SBA on 2LO2/min.  SpO2 increased from 96% to 97% and HR from 68 bpm to 74 bpm after ambulation.  Pt was off of supplemental O2 for 3-4 min during session with SpO2 measured at 93% prior to being put back on 2LO2/min, nursing notified.  Pt will benefit from PT services to address above deficits for decreased caregiver assistance upon discharge.      Follow Up Recommendations Home health PT    Equipment Recommendations  None recommended by PT    Recommendations for Other Services       Precautions / Restrictions Precautions Precautions: Fall Restrictions Weight Bearing Restrictions: No      Mobility  Bed Mobility Overal bed mobility: Needs Assistance Bed Mobility: Supine to Sit;Sit to Supine     Supine to sit:  Supervision Sit to supine: Supervision   General bed mobility comments: Extra time and effort and use of rails required with all bed mobility tasks  Transfers Overall transfer level: Needs assistance Equipment used: Rolling walker (2 wheeled) Transfers: Sit to/from Stand Sit to Stand: Supervision            Ambulation/Gait Ambulation/Gait assistance: Supervision Ambulation Distance (Feet): 30 Feet Assistive device: Rolling walker (2 wheeled) Gait Pattern/deviations: Step-through pattern   Gait velocity interpretation: at or above normal speed for age/gender General Gait Details: Pt steady with gait with slow, cautious turns, without LOB.   Stairs            Wheelchair Mobility    Modified Rankin (Stroke Patients Only)       Balance Overall balance assessment: Needs assistance Sitting-balance support: No upper extremity supported Sitting balance-Leahy Scale: Normal     Standing balance support: Bilateral upper extremity supported Standing balance-Leahy Scale: Good                               Pertinent Vitals/Pain Pain Assessment: 0-10 Pain Score: 3  Pain Location: General back pain, chronic per pt Pain Descriptors / Indicators: Constant;Aching Pain Intervention(s): Monitored during session    Home Living Family/patient expects to be discharged to:: Assisted living               Home Equipment: Walker - 2 wheels;Walker - 4 wheels;Walker - standard  Prior Function Level of Independence: Independent with assistive device(s)         Comments: Pt Ind with amb facility distances without AD mostly but with SPC occasionally.  Pt Ind with ADLs and only typically receives assistance from staff with meals and meds      Hand Dominance   Dominant Hand: Right    Extremity/Trunk Assessment        Lower Extremity Assessment Lower Extremity Assessment: Generalized weakness       Communication   Communication: No difficulties   Cognition Arousal/Alertness: Awake/alert Behavior During Therapy: WFL for tasks assessed/performed Overall Cognitive Status: Within Functional Limits for tasks assessed                      General Comments      Exercises Total Joint Exercises Ankle Circles/Pumps: AROM;Both;10 reps Quad Sets: AROM;Both;10 reps Gluteal Sets: AROM;Both;10 reps Long Arc Quad: AROM;Both;10 reps Knee Flexion: AROM;Both;10 reps Other Exercises Other Exercises: Pt educated on HEP with BLE APs, QS, and GS x 10 reps 5-6x/day   Assessment/Plan    PT Assessment Patient needs continued PT services  PT Problem List Decreased strength;Decreased activity tolerance          PT Treatment Interventions DME instruction;Gait training;Functional mobility training;Therapeutic activities;Therapeutic exercise;Balance training;Neuromuscular re-education;Patient/family education    PT Goals (Current goals can be found in the Care Plan section)  Acute Rehab PT Goals Patient Stated Goal: Improve strength and endurance PT Goal Formulation: With patient Time For Goal Achievement: 12/08/16 Potential to Achieve Goals: Good    Frequency Min 2X/week   Barriers to discharge        Co-evaluation               End of Session Equipment Utilized During Treatment: Gait belt;Oxygen Activity Tolerance: Patient tolerated treatment well Patient left: in bed;with bed alarm set;with call bell/phone within reach Nurse Communication: Other (comment) (Pt on room air for several minutes during session with SpO2 93%)         Time: 7342-8768 PT Time Calculation (min) (ACUTE ONLY): 35 min   Charges:   PT Evaluation $PT Eval Low Complexity: 1 Procedure PT Treatments $Therapeutic Exercise: 8-22 mins   PT G Codes:        DRoyetta Asal PT, DPT 11/25/16, 4:42 PM

## 2016-11-25 NOTE — Progress Notes (Addendum)
Pharmacy Antibiotic Note  Sabrina Small is a 82 y.o. female admitted on 11/24/2016 with pneumonia.  Pharmacy has been consulted for aztreonam dosing.  Plan: Aztreonam 2 g IV once given in ED Current order for aztreonam 1 g IV q8h. Will increase dose to 2 g IV q8h per improved renal function and indication.  Height: 4\' 11"  (149.9 cm) Weight: 163 lb 11.2 oz (74.3 kg) IBW/kg (Calculated) : 43.2  Temp (24hrs), Avg:99.1 F (37.3 C), Min:98 F (36.7 C), Max:101.9 F (38.8 C)   Recent Labs Lab 11/24/16 1613 11/25/16 0319  WBC 6.1 5.0  CREATININE 1.42* 0.99  LATICACIDVEN 1.2  --     Estimated Creatinine Clearance: 39.1 mL/min (by C-G formula based on SCr of 0.99 mg/dL).    Allergies  Allergen Reactions  . Doxycycline   . Glipizide   . Iodinated Diagnostic Agents   . Lortab [Hydrocodone-Acetaminophen]   . Morphine And Related   . Omnicef [Cefdinir]   . Penicillins Other (See Comments)    "I blow up and pass out" Over 10 yrs ago, thinks she did go to the hospital, but didn't remember if she was admitted. No skin necrosis.   Marland Kitchen Percocet [Oxycodone-Acetaminophen]   . Rosuvastatin   . Sulfa Antibiotics   . Trovafloxacin   . Voltaren [Diclofenac Sodium]   . Zolpidem    Microbiology results: 12/20 BCx: NGTD x2 12/20 MRSA PCR neg   Thank you for allowing pharmacy to be a part of this patient's care.  Rocky Morel, PharmD, BCPS Clinical Pharmacist 11/25/2016 10:04 AM

## 2016-11-25 NOTE — Progress Notes (Signed)
[name] [place] NAME: Sabrina Small    MR#:  [id number]  DATE OF BIRTH:  1932/12/25  SUBJECTIVE:  CHIEF COMPLAINT:   Chief Complaint  Patient presents with  . Shortness of Breath   Cough, shortness of breath and weakness. REVIEW OF SYSTEMS:  Review of Systems  Constitutional: Positive for malaise/fatigue. Negative for chills and fever.  HENT: Positive for ear pain. Negative for congestion, ear discharge, hearing loss, nosebleeds, sinus pain, sore throat and tinnitus.   Eyes: Negative for blurred vision and pain.  Respiratory: Positive for cough, sputum production and shortness of breath. Negative for hemoptysis, wheezing and stridor.   Cardiovascular: Negative for chest pain, palpitations and leg swelling.  Gastrointestinal: Negative for abdominal pain, blood in stool, diarrhea, melena, nausea and vomiting.  Genitourinary: Negative for dysuria and hematuria.  Musculoskeletal: Negative for joint pain.  Skin: Negative for itching and rash.  Neurological: Positive for weakness. Negative for dizziness, focal weakness and loss of consciousness.  Psychiatric/Behavioral: Negative for depression. The patient is not nervous/anxious.     DRUG ALLERGIES:   Allergies  Allergen Reactions  . Doxycycline   . Glipizide   . Iodinated Diagnostic Agents   . Lortab [Hydrocodone-Acetaminophen]   . Morphine And Related   . Omnicef [Cefdinir]   . Penicillins Other (See Comments)    "I blow up and pass out" Over 10 yrs ago, thinks she did go to the hospital, but didn't remember if she was admitted. No skin necrosis.   Marland Kitchen Percocet [Oxycodone-Acetaminophen]   . Rosuvastatin   . Sulfa Antibiotics   . Trovafloxacin   . Voltaren [Diclofenac Sodium]   . Zolpidem    VITALS:  Blood pressure (!) 124/58, pulse 73, temperature 98 F (36.7 C), temperature source Oral, resp. rate 18, height 4\' 11"  (1.499 m), weight 163 lb 11.2 oz (74.3 kg), SpO2 95  %. PHYSICAL EXAMINATION:  Physical Exam  Constitutional: She is oriented to person, place, and time and well-developed, well-nourished, and in no distress.  HENT:  Head: Normocephalic.  Mouth/Throat: Oropharynx is clear and moist.  Eyes: Conjunctivae and EOM are normal.  Neck: Normal range of motion. Neck supple. No JVD present. No tracheal deviation present. No thyromegaly present.  Cardiovascular: Normal rate, regular rhythm and normal heart sounds.  Exam reveals no gallop.   No murmur heard. Pulmonary/Chest: Effort normal. No respiratory distress. She has no wheezes.  Crackles.  Abdominal: Soft. Bowel sounds are normal. She exhibits no distension. There is no tenderness.  Musculoskeletal: Normal range of motion. She exhibits no edema or tenderness.  Neurological: She is alert and oriented to person, place, and time. No cranial nerve deficit.  Skin: No rash noted. No erythema.  Psychiatric: Affect normal.   LABORATORY PANEL:   CBC  Recent Labs Lab 11/25/16 0319  WBC 5.0  HGB 11.6*  HCT 33.7*  PLT 108*   ------------------------------------------------------------------------------------------------------------------ Chemistries   Recent Labs Lab 11/24/16 1613 11/25/16 0319  NA 135 138  K 3.3* 3.0*  CL 97* 103  CO2 27 27  GLUCOSE 256* 147*  BUN 35* 29*  CREATININE 1.42* 0.99  CALCIUM 9.3 8.5*  MG  --  1.7  AST 45*  --   ALT 23  --   ALKPHOS 59  --   BILITOT 1.2  --    RADIOLOGY:  No results found. ASSESSMENT AND PLAN:   82 year old female with a history of diabetes and depression with recent loss of her  husband who presents from assisted living with acute hypoxic respiratory failure due to health care acquired pneumonia.  1. Acute hypoxic respiratory failure in the setting of pneumonia with sepsis: NEB prn. Continue aztreonam. She had a localized reaction to vancomycin. Follow up on blood cultures Try to wean oxygen  2. Healthcare acquired pneumonia:   Continue abx, robitussin prn. ID consult was due to multiple antibiotics allergy. She needs a repeat CXR in 3 weeks.   3. Diabetes: Due to poor appetite, on sliding scale insulin and half of her dose of Levemir.  4. Hyperlipidemia: Continue fenofibrate and Livalo if available by pharmacy here. She has allergies Rovastatin.  5. Hypothyroidism: Continue Synthroid  6. Depression: Continue Cymbalta  7. Hypokalemia: Replaced, f/u K.  ARF due to dehydration. Continue IV fluid support, follow-up BMP.  Weakness. PT. All the records are reviewed and case discussed with Care Management/Social Worker. Management plans discussed with the patient, family and they are in agreement.  CODE STATUS: DNR  TOTAL TIME TAKING CARE OF THIS PATIENT: 38 minutes.   More than 50% of the time was spent in counseling/coordination of care: YES  POSSIBLE D/C IN 3 DAYS, DEPENDING ON CLINICAL CONDITION.   Demetrios Loll M.D on 11/25/2016 at 4:29 PM  Between 7am to 6pm - Pager - [phone]  After 6pm go to [email or web address] - Technical [employer] [facility]  Office  [phone]  CC: Primary care physician; No PCP Per Patient  Note: This dictation was prepared with Dragon dictation along with smaller phrase technology. Any transcriptional errors that result from this process are unintentional.

## 2016-11-25 NOTE — Care Management Note (Signed)
Case Management Note  Patient Details  Name: Sabrina Small MRN: [id number] Date of Birth: 05-18-1933  Subjective/Objective:   From [facility]. Admitted with PNA and hypoxia. Prior to admission patient used a cane PRN. PT pending. No home O2. PCP: Dr. Lucia Bitter [employer].                  Action/Plan: Will assist as needed.   Expected Discharge Date:                  Expected Discharge Plan:  Assisted Living / Rest Home  In-House Referral:  Clinical Social Work  Discharge planning Services     Post Acute Care Choice:    Choice offered to:     DME Arranged:    DME Agency:     HH Arranged:    [place] Agency:     Status of Service:  In process, will continue to follow  If discussed at Long Length of Stay Meetings, dates discussed:    Additional Comments:  Jolly Mango, RN 11/25/2016, 11:17 AM

## 2016-11-25 NOTE — Progress Notes (Signed)
Anticoagulation monitoring(Lovenox):  83yo  female ordered Lovenox 30 mg Q24h  Filed Weights   11/24/16 1521 11/24/16 1941  Weight: 154 lb (69.9 kg) 163 lb 11.2 oz (74.3 kg)   Body mass index is 33.06 kg/m.   Lab Results  Component Value Date   CREATININE 0.99 11/25/2016   CREATININE 1.42 (H) 11/24/2016   Estimated Creatinine Clearance: 39.1 mL/min (by C-G formula based on SCr of 0.99 mg/dL). Hemoglobin & Hematocrit     Component Value Date/Time   HGB 11.6 (L) 11/25/2016 0319   HCT 33.7 (L) 11/25/2016 0319     Per Protocol for Patient with estCrcl > 30 ml/min and BMI < 40, will transition to Lovenox 40 mg Q24h.

## 2016-11-25 NOTE — Progress Notes (Signed)
This Probation officer called ID consult to Dr. Ola Spurr at this time.

## 2016-11-25 NOTE — Consult Note (Signed)
Pt not seen.

## 2016-11-25 NOTE — NC FL2 (Addendum)
[place] LEVEL OF CARE SCREENING TOOL     IDENTIFICATION  Patient Name: Sabrina Small Birthdate: December 25, 1932 Sex: female Admission Date (Current Location): 11/24/2016  [place] and [place] Number:  Engineering geologist and Address:  [facility], [address] [place], [place] [address]      Provider Number: [id number]  Attending Physician Name and Address:  Demetrios Loll, MD  Relative Name and Phone Number:       Current Level of Care: Hospital Recommended Level of Care: [place] Prior Approval Number:    Date Approved/Denied:   PASRR Number:    Discharge Plan: Domiciliary (Rest home)    Current Diagnoses: Patient Active Problem List   Diagnosis Date Noted  . Sepsis ([place]) 11/24/2016    Orientation RESPIRATION BLADDER Height & Weight     Self, Time, Situation, Place  Normal Continent Weight: 163 lb 11.2 oz (74.3 kg) Height:  4\' 11"  (149.9 cm)  BEHAVIORAL SYMPTOMS/MOOD NEUROLOGICAL BOWEL NUTRITION STATUS   (none)  (none) Continent Diet (SLP to evaluate )  AMBULATORY STATUS COMMUNICATION OF NEEDS Skin   Limited Assist Verbally Normal                       Personal Care Assistance Level of Assistance  Bathing, Feeding, Dressing Bathing Assistance: Limited assistance Feeding assistance: Independent Dressing Assistance: Limited assistance     Functional Limitations Info  Sight, Hearing, Speech Sight Info: Adequate Hearing Info: Adequate Speech Info: Adequate    SPECIAL CARE FACTORS FREQUENCY  PT (By licensed PT)     PT Frequency:  (2-3 days per week home health. )              Contractures      Additional Factors Info  Code Status, Allergies, Insulin Sliding Scale Code Status Info:  (DNR ) Allergies Info:  (Doxycycline, Glipizide, Iodinated Diagnostic Agents, Lortab Hydrocodone-acetaminophen, Morphine And Related, Omnicef Cefdinir, Penicillins, Percocet Oxycodone-acetaminophen,  Rosuvastatin, Sulfa Antibiotics, Trovafloxacin, Voltaren Diclofenac Sodium, Zo)   Insulin Sliding Scale Info:  (NovoLog Insulin Injections. )       Current Medications (11/25/2016):  This is the current hospital active medication list Current Facility-Administered Medications  Medication Dose Route Frequency Provider Last Rate Last Dose  . 0.9 %  sodium chloride infusion   Intravenous Continuous Demetrios Loll, MD 50 mL/hr at 11/25/16 [phone]    . acetaminophen (TYLENOL) tablet 650 mg  650 mg Oral Q6H PRN Bettey Costa, MD   650 mg at 11/25/16 0950   Or  . acetaminophen (TYLENOL) suppository 650 mg  650 mg Rectal Q6H PRN Bettey Costa, MD      . allopurinol (ZYLOPRIM) tablet 100 mg  100 mg Oral Daily Demetrios Loll, MD      . aztreonam (AZACTAM) 2 g in dextrose 5 % 50 mL IVPB  2 g Intravenous Q8H Bettey Costa, MD   2 g at 11/25/16 1503  . DULoxetine (CYMBALTA) DR capsule 60 mg  60 mg Oral Daily Bettey Costa, MD   60 mg at 11/25/16 0940  . enoxaparin (LOVENOX) injection 40 mg  40 mg Subcutaneous Q24H Sital Mody, MD      . fenofibrate tablet 160 mg  160 mg Oral Daily Bettey Costa, MD   160 mg at 11/25/16 0940  . guaiFENesin-dextromethorphan (ROBITUSSIN DM) 100-10 MG/5ML syrup 5 mL  5 mL Oral Q4H PRN Demetrios Loll, MD   5 mL at 11/25/16 1502  . insulin aspart (novoLOG)  injection 0-5 Units  0-5 Units Subcutaneous QHS Demetrios Loll, MD      . insulin aspart (novoLOG) injection 0-9 Units  0-9 Units Subcutaneous TID WC Demetrios Loll, MD   2 Units at 11/25/16 1230  . insulin detemir (LEVEMIR) injection 20 Units  20 Units Subcutaneous QHS Bettey Costa, MD   20 Units at 11/24/16 2109  . levothyroxine (SYNTHROID, LEVOTHROID) tablet 50 mcg  50 mcg Oral QAC breakfast Bettey Costa, MD   50 mcg at 11/25/16 0829  . magnesium oxide (MAG-OX) tablet 400 mg  400 mg Oral BID Demetrios Loll, MD   400 mg at 11/25/16 1014  . ondansetron (ZOFRAN) tablet 4 mg  4 mg Oral Q6H PRN Bettey Costa, MD       Or  . ondansetron (ZOFRAN) injection 4 mg  4 mg Intravenous Q6H  PRN Sital Mody, MD      . potassium chloride 20 MEQ/15ML (10%) solution 40 mEq  40 mEq Oral Once Bettey Costa, MD      . pregabalin (LYRICA) capsule 150 mg  150 mg Oral BID Demetrios Loll, MD      . senna-docusate (Senokot-S) tablet 1 tablet  1 tablet Oral QHS PRN Bettey Costa, MD      . traMADol (ULTRAM) tablet 50 mg  50 mg Oral Q6H PRN Bettey Costa, MD         Discharge Medications: DISCHARGE MEDICATIONS:       Allergies as of 11/27/2016      Reactions   Doxycycline    Patient cannot recall details of reaction   Glipizide    Iodinated Diagnostic Agents    Lortab [hydrocodone-acetaminophen]    Morphine And Related    Omnicef [cefdinir]    Patient cannot recall details of reaction   Penicillins Other (See Comments)   "I blow up and pass out" Over 10 yrs ago, thinks she did go to the hospital, but didn't remember if she was admitted. No skin necrosis.    Percocet [oxycodone-acetaminophen]    Rosuvastatin    Sulfa Antibiotics    Patient cannot recall details of reaction   Trovafloxacin    Patient cannot recall details of reaction   Voltaren [diclofenac Sodium]    Zolpidem          Medication List    TAKE these medications   allopurinol 100 MG tablet Commonly known as:  ZYLOPRIM Take 100 mg by mouth daily.  azithromycin 500 MG tablet Commonly known as:  ZITHROMAX Take 1 tablet (500 mg total) by mouth daily.  BENEFIBER Powd Take 5 mLs by mouth 2 (two) times daily. Dissolve 1 teaspoon in 8oz of liquid and drink by mouth twice a day.  clindamycin 300 MG capsule Commonly known as:  CLEOCIN Take 2 capsules (600 mg total) by mouth every 8 (eight) hours.  DIABETIC TUSSIN EX 100 MG/5ML liquid Generic drug:  guaiFENesin Take 200 mg by mouth 4 (four) times daily.  DULoxetine 60 MG capsule Commonly known as:  CYMBALTA Take 60 mg by mouth daily.  fenofibrate micronized 200 MG capsule Commonly known as:  LOFIBRA Take 200 mg by mouth daily before  breakfast.  insulin aspart 100 UNIT/ML injection Commonly known as:  novoLOG Inject 2 Units into the skin 4 (four) times daily. For blood sugar greater than 150.  insulin detemir 100 UNIT/ML injection Commonly known as:  LEVEMIR Inject 45 Units into the skin at bedtime.  levothyroxine 50 MCG tablet Commonly known as:  SYNTHROID, LEVOTHROID Take 50 mcg  by mouth daily before breakfast.  linagliptin 5 MG Tabs tablet Commonly known as:  TRADJENTA Take 5 mg by mouth daily.  LIVALO 2 MG Tabs Generic drug:  Pitavastatin Calcium Take 1 mg by mouth daily.  metFORMIN 500 MG tablet Commonly known as:  GLUCOPHAGE Take 500 mg by mouth 2 (two) times daily with a meal.  multivitamin with minerals Tabs tablet Take 1 tablet by mouth daily.  PHILLIPS COLON HEALTH Caps Take 1 capsule by mouth daily.  pregabalin 150 MG capsule Commonly known as:  LYRICA Take 150 mg by mouth 2 (two) times daily.  torsemide 100 MG tablet Commonly known as:  DEMADEX Take 100 mg by mouth daily.  traMADol-acetaminophen 37.5-325 MG tablet Commonly known as:  ULTRACET Take 1 tablet by mouth every 6 (six) hours as needed (pain control).      Relevant Imaging Results:  Relevant Lab Results:   Additional Information  (SSN: [id number])  Sample, Veronia Beets, LCSW

## 2016-11-25 NOTE — Clinical Social Work Note (Signed)
Clinical Social Work Assessment  Patient Details  Name: Sabrina [facility] MRN: [id number] Date of Birth: [birth year]  Date of referral:  11/25/16               Reason for consult:  Other (Comment Required) (From [facility] )                Permission sought to share information with:  [facility] granted to share information::  Yes, Verbal Permission Granted  Name::      [facility]   Agency::     Relationship::     Contact Information:     Housing/Transportation Living arrangements for Sabrina past 2 months:  [place] of Information:  Patient, Power of Attorney, Adult Children, Facility Patient Interpreter Needed:  None Criminal Activity/Legal Involvement Pertinent to Current Situation/Hospitalization:  No - Comment as needed Significant Relationships:  Adult Children Lives with:  Facility Resident Do you feel safe going back to [facility] where you live?  Yes Need for family participation in patient care:  Yes (Comment)  Care giving concerns: Patient is a long term care resident at [facility] (fax: [phone]).    Social Worker assessment / plan:  Holiday representative (CSW) received SNF consult. Patient is from [place]. PT is recommending home health PT. CSW contacted [facility] and spoke with Med tech [facility]. Per med tech patient has been a resident for 6 months and walks with a cane or nothing at baseline. Per med tech patient is a private pay resident and is on room air at baseline. Med tech reported that [facility] is their home health preference and that patient can return when stable. CSW met with patient to address consult. Patient was alert and oriented and sitting up on [facility] side of [facility] bed. Per RN patient's oxygen can be weaned off. Patient reported that her oldest daughter [facility] is HPOA and lives in [place]. Patient reported that she has 5 adult children and her deceased husband was a Company secretary. Patient is  agreeable to return to [facility]. CSW contacted patient's daughter/ HPOA [facility]. Per [facility] her sister [facility]rina [facility] lives in [place] and will transport patient back to [facility]. Daughter is agreeable for patient to return to [facility]. Per patient she has a walker at [facility]. RN case manager aware of above. CSW will continue to follow and assist as needed.   Employment status:  Retired Forensic scientist:  Medicare PT Recommendations:  Home with [place] / Referral to community resources:  Other (Comment Required) (Patient will return to [facility] )  Patient/Family's Response to care:  Patient and daughter are agreeable for patient to return to [facility].   Patient/Family's Understanding of and Emotional Response to Diagnosis, Current Treatment, and Prognosis:  Patient and daughter were very pleasant and thanked CSW for assistance.   Emotional Assessment Appearance:  Appears stated age Attitude/Demeanor/Rapport:    Affect (typically observed):  Accepting, Adaptable, Pleasant Orientation:  Oriented to Self, Oriented to Place, Oriented to  Time, Oriented to Situation Alcohol / Substance use:  Not Applicable Psych involvement (Current and /or in [facility] community):  No (Comment)  Discharge Needs  Concerns to be addressed:  Discharge Planning Concerns Readmission within [facility] last 30 days:  No Current discharge risk:  Dependent with Mobility, Chronically ill Barriers to Discharge:  Continued Medical Work up   [employer], Veronia Beets, LCSW 11/25/2016, 4:40 PM

## 2016-11-26 LAB — BASIC METABOLIC PANEL
Anion gap: 7 (ref 5–15)
BUN: 23 mg/dL — ABNORMAL HIGH (ref 6–20)
CALCIUM: 8.9 mg/dL (ref 8.9–10.3)
CHLORIDE: 107 mmol/L (ref 101–111)
CO2: 26 mmol/L (ref 22–32)
CREATININE: 1.02 mg/dL — AB (ref 0.44–1.00)
GFR, EST AFRICAN AMERICAN: 58 mL/min — AB (ref >60)
GFR, EST NON AFRICAN AMERICAN: 50 mL/min — AB (ref >60)
Glucose, Bld: 80 mg/dL (ref 65–99)
Potassium: 3.8 mmol/L (ref 3.5–5.1)
SODIUM: 140 mmol/L (ref 135–145)

## 2016-11-26 LAB — GLUCOSE, CAPILLARY
GLUCOSE-CAPILLARY: 88 mg/dL (ref 65–99)
GLUCOSE-CAPILLARY: 81 mg/dL (ref 65–99)
GLUCOSE-CAPILLARY: 130 mg/dL — AB (ref 65–99)
Glucose-Capillary: 101 mg/dL — ABNORMAL HIGH (ref 65–99)

## 2016-11-26 MED ORDER — AZITHROMYCIN 500 MG PO TABS
500.0000 mg | ORAL_TABLET | Freq: Every day | ORAL | Status: DC
  Administered 2016-11-26 – 2016-11-27 (×2): 500 mg via ORAL
  Filled 2016-11-26 (×2): qty 1

## 2016-11-26 MED ORDER — CLINDAMYCIN HCL 150 MG PO CAPS
600.0000 mg | ORAL_CAPSULE | Freq: Three times a day (TID) | ORAL | Status: DC
  Administered 2016-11-26 – 2016-11-27 (×3): 600 mg via ORAL
  Filled 2016-11-26 (×4): qty 4

## 2016-11-26 MED ORDER — CLINDAMYCIN HCL 300 MG PO CAPS: 300.0000 mg | ORAL_CAPSULE | Freq: Three times a day (TID) | ORAL | Status: DC

## 2016-11-26 NOTE — Progress Notes (Signed)
Barclay visited Pt, he had seen earlier. Pt appeared to be in pain she described to be in her left ear and was waiting to the dc. Pt requested for prayers, which the [facility] provided with the ministry of presence.    11/26/16 1300  Clinical Encounter Type  Visited With Patient  Visit Type Follow-up;Spiritual support  Spiritual Encounters  Spiritual Needs Prayer;Other (Comment)

## 2016-11-26 NOTE — Care Management (Addendum)
I have notified [facility] home [facility] that patient is expected to discharge back to [facility] today per [place] conversation yesterday. RNCM will continue to follow. Patient is using O2 and sats in low 90's currently. I am unsure if this is acute or chronic. If O2 is new I will need new assessment/template entered and Rx.

## 2016-11-26 NOTE — Progress Notes (Signed)
PT Cancellation Note  Patient Details Name: TZIPORAH KNOKE MRN: [id number] DOB: 07-Feb-1933   Cancelled Treatment:    Reason Eval/Treat Not Completed: Patient declined, no reason specified. Pt c/o increased fatigue, will continue efforts.   Malakie Balis 11/26/2016, 11:53 AM

## 2016-11-26 NOTE — Progress Notes (Signed)
[place] at [place] NAME: Sabrina [facility]    MR#:  [id number]  DATE OF BIRTH:  02-22-1933  SUBJECTIVE:  CHIEF COMPLAINT:   Chief Complaint  Patient presents with  . Shortness of Breath   Cough, shortness of breath and weakness. Ear pain. REVIEW OF SYSTEMS:  Review of Systems  Constitutional: Positive for malaise/fatigue. Negative for chills and fever.  HENT: Positive for ear pain. Negative for congestion, ear discharge, hearing loss, nosebleeds, sinus pain, sore throat and tinnitus.   Eyes: Negative for blurred vision and pain.  Respiratory: Positive for cough, sputum production and shortness of breath. Negative for hemoptysis, wheezing and stridor.   Cardiovascular: Negative for chest pain, palpitations and leg swelling.  Gastrointestinal: Negative for abdominal pain, blood in stool, diarrhea, melena, nausea and vomiting.  Genitourinary: Negative for dysuria and hematuria.  Musculoskeletal: Negative for joint pain.  Skin: Negative for itching and rash.  Neurological: Positive for weakness. Negative for dizziness, focal weakness and loss of consciousness.  Psychiatric/Behavioral: Negative for depression. The patient is not nervous/anxious.     DRUG ALLERGIES:   Allergies  Allergen Reactions  . Doxycycline     Patient cannot recall details of reaction  . Glipizide   . Iodinated Diagnostic Agents   . Lortab [Hydrocodone-Acetaminophen]   . Morphine And Related   . Omnicef [Cefdinir]     Patient cannot recall details of reaction  . Penicillins Other (See Comments)    "I blow up and pass out" Over 10 yrs ago, thinks she did go to the hospital, but didn't remember if she was admitted. No skin necrosis.   Marland Kitchen Percocet [Oxycodone-Acetaminophen]   . Rosuvastatin   . Sulfa Antibiotics     Patient cannot recall details of reaction  . Trovafloxacin     Patient cannot recall details of reaction  . Voltaren [Diclofenac Sodium]   .  Zolpidem    VITALS:  Blood pressure (!) 127/41, pulse 75, temperature 98.8 F (37.1 C), temperature source Oral, resp. rate 18, height 4\' 11"  (1.499 m), weight 163 lb 11.2 oz (74.3 kg), SpO2 90 %. PHYSICAL EXAMINATION:  Physical Exam  Constitutional: She is oriented to person, place, and time and well-developed, well-nourished, and in no distress.  HENT:  Head: Normocephalic.  Mouth/Throat: Oropharynx is clear and moist.  Eyes: Conjunctivae and EOM are normal.  Neck: Normal range of motion. Neck supple. No JVD present. No tracheal deviation present. No thyromegaly present.  Cardiovascular: Normal rate, regular rhythm and normal heart sounds.  Exam reveals no gallop.   No murmur heard. Pulmonary/Chest: Effort normal. No respiratory distress. She has no wheezes.  Crackles.  Abdominal: Soft. Bowel sounds are normal. She exhibits no distension. There is no tenderness.  Musculoskeletal: Normal range of motion. She exhibits no edema or tenderness.  Neurological: She is alert and oriented to person, place, and time. No cranial nerve deficit.  Skin: No rash noted. No erythema.  Psychiatric: Affect normal.   LABORATORY PANEL:   CBC  Recent Labs Lab 11/25/16 0319  WBC 5.0  HGB 11.6*  HCT 33.7*  PLT 108*   ------------------------------------------------------------------------------------------------------------------ Chemistries   Recent Labs Lab 11/24/16 1613 11/25/16 0319 11/26/16 0523  NA 135 138 140  K 3.3* 3.0* 3.8  CL 97* 103 107  CO2 27 27 26   GLUCOSE 256* 147* 80  BUN 35* 29* 23*  CREATININE 1.42* 0.99 1.02*  CALCIUM 9.3 8.5* 8.9  MG  --  1.7  --  AST 45*  --   --   ALT 23  --   --   ALKPHOS 59  --   --   BILITOT 1.2  --   --    RADIOLOGY:  No results found. ASSESSMENT AND PLAN:   82 year old female with a history of diabetes and depression with recent loss of her husband who presents from assisted living with acute hypoxic respiratory failure due to  health care acquired pneumonia.  1. Acute hypoxic respiratory failure in the setting of pneumonia with sepsis: NEB prn. She has been treated with aztreonam. Change to zithromax and clindamycin per Dr. Ola Spurr. She had a localized reaction to vancomycin. Follow up on blood cultures off oxygen.  2. Healthcare acquired pneumonia:  She has been treated with aztreonam. Change to zithromax and clindamycin per Dr. Ola Spurr, continue robitussin prn. ID consult was due to multiple antibiotics allergy. She needs a repeat CXR in 3 weeks.   3. Diabetes: Due to poor appetite, on sliding scale insulin and half of her dose of Levemir.  4. Hyperlipidemia: Continue fenofibrate and Livalo if available by pharmacy here. She has allergies Rovastatin.  5. Hypothyroidism: Continue Synthroid  6. Depression: Continue Cymbalta  7. Hypokalemia: Replaced, improved.  ARF due to dehydration. Improved with IV fluid support.  Weakness. PT. Pt declined. All the records are reviewed and case discussed with Care Management/Social Worker. Management plans discussed with the patient, family and they are in agreement.  CODE STATUS: DNR  TOTAL TIME TAKING CARE OF THIS PATIENT: 38 minutes.   More than 50% of the time was spent in counseling/coordination of care: YES  POSSIBLE D/C IN 2 DAYS, DEPENDING ON CLINICAL CONDITION.   Demetrios Loll M.D on 11/26/2016 at 3:30 PM  Between 7am to 6pm - Pager - [phone]  After 6pm go to [email or web address] - Technical brewer [facility]  Office  [phone]  CC: Primary care physician; No PCP Per Patient  Note: This dictation was prepared with Dragon dictation along with smaller phrase technology. Any transcriptional errors that result from this process are unintentional.

## 2016-11-26 NOTE — Progress Notes (Signed)
Pharmacy Antibiotic Note  [facility] is a 82 y.o. female admitted on 11/24/2016 with pneumonia.  Pharmacy has been consulted for aztreonam dosing.  Plan: Continue aztreonam 2gm IV Q8H  Height: 4\' 11"  (149.9 cm) Weight: 163 lb 11.2 oz (74.3 kg) IBW/kg (Calculated) : 43.2  Temp (24hrs), Avg:98.7 F (37.1 C), Min:98.2 F (36.8 C), Max:99.7 F (37.6 C)   Recent Labs Lab 11/24/16 1613 11/25/16 0319 11/26/16 0523  WBC 6.1 5.0  --   CREATININE 1.42* 0.99 1.02*  LATICACIDVEN 1.2  --   --     Estimated Creatinine Clearance: 38 mL/min (by C-G formula based on SCr of 1.02 mg/dL (H)).    Allergies  Allergen Reactions  . Doxycycline   . Glipizide   . Iodinated Diagnostic Agents   . Lortab [Hydrocodone-Acetaminophen]   . Morphine And Related   . Omnicef [Cefdinir]   . Penicillins Other (See Comments)    "I blow up and pass out" Over 10 yrs ago, thinks she did go to the hospital, but didn't remember if she was admitted. No skin necrosis.   Marland Kitchen Percocet [Oxycodone-Acetaminophen]   . Rosuvastatin   . Sulfa Antibiotics   . Trovafloxacin   . Voltaren [Diclofenac Sodium]   . Zolpidem    Microbiology results: 12/20 BCx: NGTD x2 12/20 MRSA PCR neg   Thank you for allowing pharmacy to be a part of this patient's care.  Cheri Guppy, PharmD, BCPS Clinical Pharmacist 11/26/2016 10:35 AM

## 2016-11-27 LAB — GLUCOSE, CAPILLARY
Glucose-Capillary: 93 mg/dL (ref 65–99)
Glucose-Capillary: 121 mg/dL — ABNORMAL HIGH (ref 65–99)

## 2016-11-27 MED ORDER — AZITHROMYCIN 500 MG PO TABS
500.0000 mg | ORAL_TABLET | Freq: Every day | ORAL | 0 refills | Status: DC
Start: 2016-11-27 — End: 2017-03-02

## 2016-11-27 MED ORDER — CLINDAMYCIN HCL 300 MG PO CAPS: 600.0000 mg | ORAL_CAPSULE | Freq: Three times a day (TID) | ORAL | 0 refills | Status: DC

## 2016-11-27 NOTE — Discharge Planning (Addendum)
IV removed.  Discharge papers given, explained and educated.  Informed of suggested FU appt. But unable to set up d/t being Sat and a holiday weekend.  Patient agreed to call Tues to set up.  Scripts given.  RN assessment and VS revealed stability for DC to home with [facility].  Patient stable (91%) on RA with acitivity and will not need O2 for discharge.  Once ready, and ride arrives, will be wheeled to front and family transporting back to [facility] (assisted) via car.  Patient currently not on any pain. Report called and s/w Georgiann Cocker. Awaiting ride to arrive.

## 2016-11-27 NOTE — Clinical Social Work Note (Signed)
Patient to dc to [facility] via transport by her daughter [name]. Facility is aware and has received documentation. CSW signing off but available for any additional dc needs.  Santiago Bumpers, MSW, LCSW-A  [phone]

## 2016-11-27 NOTE — Discharge Summary (Signed)
[place] at [place] NAME: Sabrina [facility]    MR#:  [id number]  DATE OF BIRTH:  1933-06-05  DATE OF ADMISSION:  11/24/2016   ADMITTING PHYSICIAN: Robyne Peers, MD  DATE OF DISCHARGE: 11/27/2016 PRIMARY CARE PHYSICIAN: No PCP Per Patient   ADMISSION DIAGNOSIS:  Demand ischemia ([place]) [I24.8] Elevated troponin I level [R74.8] Acute respiratory failure with hypoxemia ([name]) [J96.01] Community acquired pneumonia of right lung, unspecified part of lung [J18.9] DISCHARGE DIAGNOSIS:  Active Problems:   Sepsis ([facility]) Acute hypoxic respiratory failure in the setting of pneumonia with sepsis Healthcare acquired pneumonia SECONDARY DIAGNOSIS:   Past Medical History:  Diagnosis Date  . Arthritis   . Diabetes mellitus without complication ([place])   . Diverticulitis    HOSPITAL COURSE:  82 year old female with a history of diabetes and depression with recent loss of her husband who presents from assisted living with acute hypoxic respiratory failure due to health care acquired pneumonia.  1. Acute hypoxic respiratory failure in the setting of pneumonia with sepsis: NEB prn. She has been treated with aztreonam. Change to zithromax and clindamycin for 7 days per Dr. Ola Spurr. She had a localized reaction to vancomycin. Follow up on blood cultures off oxygen.  2. Healthcare acquired pneumonia:  She has been treated with aztreonam. Change to zithromax and clindamycin per Dr. Ola Spurr, continue robitussin prn. ID consult was due to multiple antibiotics allergy. She needs a repeat CXR in 3 weeks.   3. Diabetes: Due to poor appetite, on sliding scale insulin and half of her dose of Levemir.  4. Hyperlipidemia: Continue fenofibrate and Livalo if available by pharmacy here. She has allergies Rovastatin.  5. Hypothyroidism: Continue Synthroid  6. Depression: Continue Cymbalta  7. Hypokalemia: Replaced, improved.  ARF due to  dehydration. Improved with IV fluid support.  DISCHARGE CONDITIONS:  Stable, discharge to ALF with HHPT CONSULTS OBTAINED:  Treatment Team:  Leonel Ramsay, MD DRUG ALLERGIES:   Allergies  Allergen Reactions  . Doxycycline     Patient cannot recall details of reaction  . Glipizide   . Iodinated Diagnostic Agents   . Lortab [Hydrocodone-Acetaminophen]   . Morphine And Related   . Omnicef [Cefdinir]     Patient cannot recall details of reaction  . Penicillins Other (See Comments)    "I blow up and pass out" Over 10 yrs ago, thinks she did go to the hospital, but didn't remember if she was admitted. No skin necrosis.   Marland Kitchen Percocet [Oxycodone-Acetaminophen]   . Rosuvastatin   . Sulfa Antibiotics     Patient cannot recall details of reaction  . Trovafloxacin     Patient cannot recall details of reaction  . Voltaren [Diclofenac Sodium]   . Zolpidem    DISCHARGE MEDICATIONS:   Allergies as of 11/27/2016      Reactions   Doxycycline    Patient cannot recall details of reaction   Glipizide    Iodinated Diagnostic Agents    Lortab [hydrocodone-acetaminophen]    Morphine And Related    Omnicef [cefdinir]    Patient cannot recall details of reaction   Penicillins Other (See Comments)   "I blow up and pass out" Over 10 yrs ago, thinks she did go to the hospital, but didn't remember if she was admitted. No skin necrosis.    Percocet [oxycodone-acetaminophen]    Rosuvastatin    Sulfa Antibiotics    Patient cannot recall details of reaction   Trovafloxacin  Patient cannot recall details of reaction   Voltaren [diclofenac Sodium]    Zolpidem       Medication List    TAKE these medications   allopurinol 100 MG tablet Commonly known as:  ZYLOPRIM Take 100 mg by mouth daily.   azithromycin 500 MG tablet Commonly known as:  ZITHROMAX Take 1 tablet (500 mg total) by mouth daily.   BENEFIBER Powd Take 5 mLs by mouth 2 (two) times daily. Dissolve 1 teaspoon in 8oz of  liquid and drink by mouth twice a day.   clindamycin 300 MG capsule Commonly known as:  CLEOCIN Take 2 capsules (600 mg total) by mouth every 8 (eight) hours.   DIABETIC TUSSIN EX 100 MG/5ML liquid Generic drug:  guaiFENesin Take 200 mg by mouth 4 (four) times daily.   DULoxetine 60 MG capsule Commonly known as:  CYMBALTA Take 60 mg by mouth daily.   fenofibrate micronized 200 MG capsule Commonly known as:  LOFIBRA Take 200 mg by mouth daily before breakfast.   insulin aspart 100 UNIT/ML injection Commonly known as:  novoLOG Inject 2 Units into the skin 4 (four) times daily. For blood sugar greater than 150.   insulin detemir 100 UNIT/ML injection Commonly known as:  LEVEMIR Inject 45 Units into the skin at bedtime.   levothyroxine 50 MCG tablet Commonly known as:  SYNTHROID, LEVOTHROID Take 50 mcg by mouth daily before breakfast.   linagliptin 5 MG Tabs tablet Commonly known as:  TRADJENTA Take 5 mg by mouth daily.   LIVALO 2 MG Tabs Generic drug:  Pitavastatin Calcium Take 1 mg by mouth daily.   metFORMIN 500 MG tablet Commonly known as:  GLUCOPHAGE Take 500 mg by mouth 2 (two) times daily with a meal.   multivitamin with minerals Tabs tablet Take 1 tablet by mouth daily.   PHILLIPS COLON HEALTH Caps Take 1 capsule by mouth daily.   pregabalin 150 MG capsule Commonly known as:  LYRICA Take 150 mg by mouth 2 (two) times daily.   torsemide 100 MG tablet Commonly known as:  DEMADEX Take 100 mg by mouth daily.   traMADol-acetaminophen 37.5-325 MG tablet Commonly known as:  ULTRACET Take 1 tablet by mouth every 6 (six) hours as needed (pain control).        DISCHARGE INSTRUCTIONS:  See AVS.  If you experience worsening of your admission symptoms, develop shortness of breath, life threatening emergency, suicidal or homicidal thoughts you must seek medical attention immediately by calling 911 or calling your MD immediately  if symptoms less severe.  You  Must read complete instructions/literature along with all the possible adverse reactions/side effects for all the Medicines you take and that have been prescribed to you. Take any new Medicines after you have completely understood and accpet all the possible adverse reactions/side effects.   Please note  You were cared for by a hospitalist during your hospital stay. If you have any questions about your discharge medications or the care you received while you were in the hospital after you are discharged, you can call the unit and asked to speak with the hospitalist on call if the hospitalist that took care of you is not available. Once you are discharged, your primary care physician will handle any further medical issues. Please note that NO REFILLS for any discharge medications will be authorized once you are discharged, as it is imperative that you return to your primary care physician (or establish a relationship with a primary care physician if  you do not have one) for your aftercare needs so that they can reassess your need for medications and monitor your lab values.    On the day of Discharge:  VITAL SIGNS:  Blood pressure 137/64, pulse 74, temperature 97.8 F (36.6 C), temperature source Axillary, resp. rate (!) 22, height 4\' 11"  (1.499 m), weight 163 lb 11.2 oz (74.3 kg), SpO2 91 %. PHYSICAL EXAMINATION:  GENERAL:  82 y.o.-year-old patient lying in the bed with no acute distress.  EYES: Pupils equal, round, reactive to light and accommodation. No scleral icterus. Extraocular muscles intact.  HEENT: Head atraumatic, normocephalic. Oropharynx and nasopharynx clear.  NECK:  Supple, no jugular venous distention. No thyroid enlargement, no tenderness.  LUNGS: Normal breath sounds bilaterally, no wheezing, mild crackles on left base. No use of accessory muscles of respiration.  CARDIOVASCULAR: S1, S2 normal. No murmurs, rubs, or gallops.  ABDOMEN: Soft, non-tender, non-distended. Bowel sounds  present. No organomegaly or mass.  EXTREMITIES: No pedal edema, cyanosis, or clubbing.  NEUROLOGIC: Cranial nerves II through XII are intact. Muscle strength 5/5 in all extremities. Sensation intact. Gait not checked.  PSYCHIATRIC: The patient is alert and oriented x 3.  SKIN: No obvious rash, lesion, or ulcer.  DATA REVIEW:   CBC  Recent Labs Lab 11/25/16 0319  WBC 5.0  HGB 11.6*  HCT 33.7*  PLT 108*    Chemistries   Recent Labs Lab 11/24/16 1613 11/25/16 0319 11/26/16 0523  NA 135 138 140  K 3.3* 3.0* 3.8  CL 97* 103 107  CO2 27 27 26   GLUCOSE 256* 147* 80  BUN 35* 29* 23*  CREATININE 1.42* 0.99 1.02*  CALCIUM 9.3 8.5* 8.9  MG  --  1.7  --   AST 45*  --   --   ALT 23  --   --   ALKPHOS 59  --   --   BILITOT 1.2  --   --      Microbiology Results  Results for orders placed or performed during the hospital encounter of 11/24/16  Blood Culture (routine x 2)     Status: None (Preliminary result)   Collection Time: 11/24/16  4:13 PM  Result Value Ref Range Status   Specimen Description BLOOD  L AC  Final   Special Requests BOTTLES DRAWN AEROBIC AND ANAEROBIC  13ML  Final   Culture NO GROWTH 2 DAYS  Final   Report Status PENDING  Incomplete  Blood Culture (routine x 2)     Status: None (Preliminary result)   Collection Time: 11/24/16  4:14 PM  Result Value Ref Range Status   Specimen Description BLOOD R WRIST  Final   Special Requests   Final    BOTTLES DRAWN AEROBIC AND ANAEROBIC  ANA 9 AER 13 ML   Culture NO GROWTH 2 DAYS  Final   Report Status PENDING  Incomplete  MRSA PCR Screening     Status: None   Collection Time: 11/25/16  6:51 AM  Result Value Ref Range Status   MRSA by PCR NEGATIVE NEGATIVE Final    Comment:        The GeneXpert MRSA Assay (FDA approved for NASAL specimens only), is one component of a comprehensive MRSA colonization surveillance program. It is not intended to diagnose MRSA infection nor to guide or monitor treatment  for MRSA infections.     RADIOLOGY:  No results found.   Management plans discussed with the patient, family and they are in agreement.  CODE  STATUS:     Code Status Orders        Start     Ordered   11/24/16 1802  Do not attempt resuscitation (DNR)  Continuous    Question Answer Comment  In the event of cardiac or respiratory ARREST Do not call a "code blue"   In the event of cardiac or respiratory ARREST Do not perform Intubation, CPR, defibrillation or ACLS   In the event of cardiac or respiratory ARREST Use medication by any route, position, wound care, and other measures to relive pain and suffering. May use oxygen, suction and manual treatment of airway obstruction as needed for comfort.      11/24/16 1801    Code Status History    Date Active Date Inactive Code Status Order ID Comments User Context   This patient has a current code status but no historical code status.    Advance Directive Documentation   Flowsheet Row Most Recent Value  Type of Advance Directive  Healthcare Power of Attorney  Pre-existing out of facility DNR order (yellow form or pink MOST form)  No data  "MOST" Form in Place?  No data      TOTAL TIME TAKING CARE OF THIS PATIENT: 35 minutes.    Demetrios Loll M.D on 11/27/2016 at 10:27 AM  Between 7am to 6pm - Pager - [phone]  After 6pm go to [email or web address] - Technical brewer [facility]  Office  [phone]  CC: Primary care physician; No PCP Per Patient   Note: This dictation was prepared with Dragon dictation along with smaller phrase technology. Any transcriptional errors that result from this process are unintentional.

## 2016-11-27 NOTE — Discharge Instructions (Signed)
Heart healthy and ADA diet. Aspiration and fall precaution. HHPT

## 2016-11-27 NOTE — Care Management Note (Signed)
Case Management Note  Patient Details  Name: Sabrina Small MRN: [id number] Date of Birth: [birth year]  Subjective/Objective:     Mrs Fei was discharged back to [name] with HH=PT and RN services called to [facility]. Discussed oxygen staturation status with Rodman Key RN who stated that Mrs Schindler does not qualify foir new home oxygen per 02 sats in the 90's on room air.                Action/Plan:   Expected Discharge Date:                  Expected Discharge Plan:  Assisted Living / Rest Home  In-House Referral:  Clinical Social Work  Discharge planning Services     Post Acute Care Choice:    Choice offered to:     DME Arranged:    DME Agency:     HH Arranged:    [place] Agency:     Status of Service:  In process, will continue to follow  If discussed at Long Length of Stay Meetings, dates discussed:    Additional Comments:  Rockett,Marilyn A, RN 11/27/2016, 2:21 PM

## 2016-11-27 NOTE — Evaluation (Signed)
Clinical/Bedside Swallow Evaluation Patient Details  Name: Sabrina Small MRN: [id number] Date of Birth: 04/05/1933  Today's Date: 11/27/2016 Time: SLP Start Time (ACUTE ONLY): 0755 SLP Stop Time (ACUTE ONLY): 0855 SLP Time Calculation (min) (ACUTE ONLY): 60 min  Past Medical History:  Past Medical History:  Diagnosis Date  . Arthritis   . Diabetes mellitus without complication ([place])   . Diverticulitis    Past Surgical History:  Past Surgical History:  Procedure Laterality Date  . ABDOMINAL HYSTERECTOMY    . CHOLECYSTECTOMY     HPI:  Pt is a 82 y.o. female with a known history of Diabetes who presents from [facility]  with above complaint. Patient was brought in by her daughter who was concerned due to patient's weakness and poor appetite over the past several days. She was seen by her PCP, doctors making house calls yesterday. Chest x-ray was obtained which showed pneumonia. An antibiotic was called in however when patient's daughter went to pick it up at the pharmacy it was taking of very long time due to her multiple allergies to make sure patient would not have a side effect from this antibiotic. Her daughter got anxious and was told by the pharmacist to bring patient into the ER for further evaluation. In the emergency room chest x-ray is consistent with pneumonia and she is found to have hypoxia. Pt has been under significant stress per family d/t the death of pt's husband last week. Pt denied any difficulty eating/drinking. Current CXR revealed Cardiomegaly with mild central pulmonary vascular prominence w/ chronic bronchitic -smoking related changes w/ superimposed interstitial and early alveolar pneumonia.   Assessment / Plan / Recommendation Clinical Impression  Pt appeared to adequately tolerate trials of thin liquids and puree/soft solids w/ no immediate, overt s/s of aspiration noted. There was no decline in vocal quality post trials assessed; no decline or changes in  RR/effort during/post trials. Oral phase appeared wfl for bolus management w/ trials given(mech soft foods included). Pt required assist w/ setup assist and education given on general aspiration precautions to follow. Recommend continue the [place] w/ thin liquids; meds given w/ a puree IF any difficulty swallowing w/ liquids noted by NSG. Aspiration precautions.     Aspiration Risk   (reduced following precautions)    Diet Recommendation  Dysphagia 3 (mech soft foods) w/ thin liquids; general aspiration precautions; tray setup as needed.  Medication Administration: Whole meds with puree (if any difficulty swallowing w/ liquids)    Other  Recommendations Recommended Consults:  (Dietician f/u) Oral Care Recommendations: Oral care BID;Staff/trained caregiver to provide oral care   Follow up Recommendations None      Frequency and Duration            Prognosis Prognosis for Safe Diet Advancement: Good      Swallow Study   General Date of Onset: 11/24/16 HPI: Pt is a 82 y.o. female with a known history of Diabetes who presents from [facility] with above complaint. Patient was brought in by her daughter who was concerned due to patient's weakness and poor appetite over the past several days. She was seen by her PCP, doctors making house calls yesterday. Chest x-ray was obtained which showed pneumonia. An antibiotic was called in however when patient's daughter went to pick it up at the pharmacy it was taking of very long time due to her multiple allergies to make sure patient would not have a side effect from this antibiotic. Her daughter got anxious  and was told by the pharmacist to bring patient into the ER for further evaluation. In the emergency room chest x-ray is consistent with pneumonia and she is found to have hypoxia. Pt has been under significant stress per family d/t the death of pt's husband last week. Pt denied any difficulty eating/drinking. Current CXR revealed  Cardiomegaly with mild central pulmonary vascular prominence w/ chronic bronchitic -smoking related changes w/ superimposed interstitial and early alveolar pneumonia. Type of Study: Bedside Swallow Evaluation Previous Swallow Assessment: none indicated Diet Prior to this Study: Regular;Thin liquids (at home) Temperature Spikes Noted: No (wbc 5.0) Respiratory Status: Nasal cannula (2 liters) History of Recent Intubation: No Behavior/Cognition: Alert;Cooperative;Pleasant mood Oral Cavity Assessment: Within Functional Limits;Dry Oral Care Completed by SLP: Recent completion by staff Oral Cavity - Dentition: Adequate natural dentition;Missing dentition Vision: Functional for self-feeding Self-Feeding Abilities: Able to feed self;Needs set up Patient Positioning: Upright in bed Baseline Vocal Quality: Normal Volitional Cough: Strong Volitional Swallow: Able to elicit    Oral/Motor/Sensory Function Overall Oral Motor/Sensory Function: Within functional limits   Ice Chips Ice chips: Not tested   Thin Liquid Thin Liquid: Within functional limits Presentation: Cup;Self Fed;Straw (~4 ozs total)    Nectar Thick Nectar Thick Liquid: Not tested   Honey Thick Honey Thick Liquid: Not tested   Puree Puree: Within functional limits Presentation: Self Fed;Spoon (4 trials)   Solid   GO   Solid: Within functional limits (mech soft foods) Presentation: Self Fed;Spoon (3 trials) Other Comments: pt stated the softer, cooked foods are easier to eat         Orinda Kenner, MS, CCC-SLP Nannie Starzyk 11/27/2016,12:25 PM

## 2016-11-29 LAB — CULTURE, BLOOD (ROUTINE X 2)
CULTURE: NO GROWTH
Culture: NO GROWTH

## 2017-02-03 ENCOUNTER — Encounter: Payer: Self-pay | Admitting: *Deleted

## 2017-02-04 ENCOUNTER — Encounter
Admission: RE | Disposition: A | Discharge status: Home / Self Care | Payer: Self-pay | Source: Ambulatory Visit | Attending: Gastroenterology

## 2017-02-04 ENCOUNTER — Ambulatory Visit: Payer: Medicare Other | Admitting: Anesthesiology

## 2017-02-04 ENCOUNTER — Ambulatory Visit
Admission: RE | Admit: 2017-02-04 | Discharge: 2017-02-04 | Disposition: A | Discharge status: Home / Self Care | Payer: Medicare Other | Source: Ambulatory Visit | Attending: Gastroenterology | Admitting: Gastroenterology

## 2017-02-04 DIAGNOSIS — Z794 Long term (current) use of insulin: Secondary | ICD-10-CM | POA: Diagnosis not present

## 2017-02-04 DIAGNOSIS — E119 Type 2 diabetes mellitus without complications: Secondary | ICD-10-CM | POA: Insufficient documentation

## 2017-02-04 DIAGNOSIS — Z880 Allergy status to penicillin: Secondary | ICD-10-CM | POA: Insufficient documentation

## 2017-02-04 DIAGNOSIS — Z79899 Other long term (current) drug therapy: Secondary | ICD-10-CM | POA: Diagnosis not present

## 2017-02-04 DIAGNOSIS — D123 Benign neoplasm of transverse colon: Secondary | ICD-10-CM | POA: Diagnosis not present

## 2017-02-04 DIAGNOSIS — M1990 Unspecified osteoarthritis, unspecified site: Secondary | ICD-10-CM | POA: Insufficient documentation

## 2017-02-04 DIAGNOSIS — R109 Unspecified abdominal pain: Secondary | ICD-10-CM | POA: Diagnosis not present

## 2017-02-04 DIAGNOSIS — Z6829 Body mass index (BMI) 29.0-29.9, adult: Secondary | ICD-10-CM | POA: Insufficient documentation

## 2017-02-04 DIAGNOSIS — D120 Benign neoplasm of cecum: Secondary | ICD-10-CM | POA: Diagnosis not present

## 2017-02-04 DIAGNOSIS — D122 Benign neoplasm of ascending colon: Secondary | ICD-10-CM | POA: Diagnosis not present

## 2017-02-04 DIAGNOSIS — E039 Hypothyroidism, unspecified: Secondary | ICD-10-CM | POA: Diagnosis not present

## 2017-02-04 DIAGNOSIS — K529 Noninfective gastroenteritis and colitis, unspecified: Secondary | ICD-10-CM | POA: Insufficient documentation

## 2017-02-04 DIAGNOSIS — K5730 Diverticulosis of large intestine without perforation or abscess without bleeding: Secondary | ICD-10-CM | POA: Insufficient documentation

## 2017-02-04 DIAGNOSIS — Z87891 Personal history of nicotine dependence: Secondary | ICD-10-CM | POA: Insufficient documentation

## 2017-02-04 HISTORY — PX: COLONOSCOPY WITH PROPOFOL: SHX5780

## 2017-02-04 HISTORY — DX: Bell's palsy: G51.0

## 2017-02-04 HISTORY — DX: Major depressive disorder, single episode, unspecified: F32.9

## 2017-02-04 HISTORY — DX: Depression, unspecified: F32.A

## 2017-02-04 HISTORY — DX: Polyp of colon: K63.5

## 2017-02-04 HISTORY — DX: Hypothyroidism, unspecified: E03.9

## 2017-02-04 LAB — CBC WITH DIFFERENTIAL/PLATELET
Basophils Absolute: 0 10*3/uL (ref 0–0.1)
Basophils Relative: 1 %
EOS ABS: 0.2 10*3/uL (ref 0–0.7)
EOS PCT: 4 %
HCT: 39.2 % (ref 35.0–47.0)
Hemoglobin: 13.7 g/dL (ref 12.0–16.0)
LYMPHS ABS: 1.2 10*3/uL (ref 1.0–3.6)
LYMPHS PCT: 31 %
MCH: 32.1 pg (ref 26.0–34.0)
MCHC: 35 g/dL (ref 32.0–36.0)
MCV: 91.7 fL (ref 80.0–100.0)
MONO ABS: 0.3 10*3/uL (ref 0.2–0.9)
MONOS PCT: 7 %
Neutro Abs: 2.2 10*3/uL (ref 1.4–6.5)
Neutrophils Relative %: 57 %
PLATELETS: 82 10*3/uL — AB (ref 150–440)
RBC: 4.27 MIL/uL (ref 3.80–5.20)
RDW: 15 % — AB (ref 11.5–14.5)
WBC: 3.8 10*3/uL (ref 3.6–11.0)

## 2017-02-04 LAB — PROTIME-INR
INR: 1.23
Prothrombin Time: 15.6 seconds — ABNORMAL HIGH (ref 11.4–15.2)

## 2017-02-04 LAB — GLUCOSE, CAPILLARY: Glucose-Capillary: 132 mg/dL — ABNORMAL HIGH (ref 65–99)

## 2017-02-04 SURGERY — COLONOSCOPY WITH PROPOFOL
Anesthesia: General

## 2017-02-04 MED ORDER — PROPOFOL 500 MG/50ML IV EMUL
INTRAVENOUS | Status: DC | PRN
  Administered 2017-02-04: 100 ug/kg/min via INTRAVENOUS

## 2017-02-04 MED ORDER — PROPOFOL 10 MG/ML IV BOLUS
INTRAVENOUS | Status: AC
  Filled 2017-02-04: qty 20

## 2017-02-04 MED ORDER — LIDOCAINE HCL (CARDIAC) 20 MG/ML IV SOLN
INTRAVENOUS | Status: DC | PRN
  Administered 2017-02-04: 50 mg via INTRATRACHEAL

## 2017-02-04 MED ORDER — SODIUM CHLORIDE 0.9 % IV SOLN
INTRAVENOUS | Status: DC
  Administered 2017-02-04: 09:00:00 via INTRAVENOUS

## 2017-02-04 MED ORDER — LIDOCAINE HCL (PF) 2 % IJ SOLN
INTRAMUSCULAR | Status: AC
  Filled 2017-02-04: qty 2

## 2017-02-04 MED ORDER — SODIUM CHLORIDE 0.9 % IV SOLN: INTRAVENOUS | Status: DC

## 2017-02-04 MED ORDER — PROPOFOL 10 MG/ML IV BOLUS
INTRAVENOUS | Status: DC | PRN
  Administered 2017-02-04: 30 mg via INTRAVENOUS

## 2017-02-04 NOTE — Anesthesia Postprocedure Evaluation (Signed)
Anesthesia Post Note  Patient: Sabrina Small  Procedure(s) Performed: Procedure(s) (LRB): COLONOSCOPY WITH PROPOFOL (N/A)  Patient location during evaluation: PACU Anesthesia Type: General Level of consciousness: awake Pain management: pain level controlled Vital Signs Assessment: post-procedure vital signs reviewed and stable Respiratory status: spontaneous breathing Cardiovascular status: stable Anesthetic complications: no     Last Vitals:  Vitals:   02/04/17 1312 02/04/17 1322  BP: 127/60 (!) 126/46  Pulse: 62 (!) 58  Resp: 15 11  Temp:      Last Pain:  Vitals:   02/04/17 1252  TempSrc: Tympanic  PainSc: Asleep                 VAN STAVEREN,Beatris Belen

## 2017-02-04 NOTE — Anesthesia Post-op Follow-up Note (Cosign Needed)
Anesthesia QCDR form completed.        

## 2017-02-04 NOTE — Op Note (Signed)
[facility] Gastroenterology Patient Name: Sabrina Small Procedure Date: 02/04/2017 8:36 AM MRN: [id number] Account #: [id number] Date of Birth: 1933/11/14 Admit Type: Outpatient Age: 83 Room: [facility] ENDO ROOM 3 Gender: Female Note Status: Finalized Procedure:            Colonoscopy Indications:          Generalized abdominal pain, Chronic diarrhea Providers:            Lollie Sails, MD Medicines:            Monitored Anesthesia Care Complications:        No immediate complications. Procedure:            Pre-Anesthesia Assessment:                       - ASA Grade Assessment: III - A patient with severe                        systemic disease.                       After obtaining informed consent, the colonoscope was                        passed under direct vision. Throughout the procedure,                        the patient's blood pressure, pulse, and oxygen                        saturations were monitored continuously. The                        Colonoscope was introduced through the anus and                        advanced to the the cecum, identified by appendiceal                        orifice and ileocecal valve. The quality of the bowel                        preparation was fair. Findings:      A 4 mm polyp was found in the proximal transverse colon. The polyp was       sessile. The polyp was removed with a cold snare. Resection and       retrieval were complete.      A 3 mm polyp was found in the ascending colon. The polyp was flat. The       polyp was removed with a cold biopsy forceps. Resection and retrieval       were complete.      Two sessile polyps were found in the cecum. The polyps were 2 to 4 mm in       size. These polyps were removed with a cold snare. Resection and       retrieval were complete.      A 10 mm polyp was found in the distal ascending colon. The polyp was       semi-pedunculated. The polyp was removed with a cold  snare. Resection       and retrieval were complete. To  prevent bleeding after the polypectomy,       one hemostatic clip was successfully placed. There was no bleeding at       the end of the maneuver.      Multiple small and large-mouthed diverticula were found in the sigmoid       colon, descending colon and transverse colon.      Biopsies for histology were taken with a cold forceps from the left       colon for evaluation of microscopic colitis.      The digital rectal exam was normal. Impression:           - Preparation of the colon was fair.                       - One 4 mm polyp in the proximal transverse colon,                        removed with a cold snare. Resected and retrieved.                       - One 3 mm polyp in the ascending colon, removed with a                        cold biopsy forceps. Resected and retrieved.                       - Two 2 to 4 mm polyps in the cecum, removed with a                        cold snare. Resected and retrieved.                       - One 10 mm polyp in the distal ascending colon,                        removed with a cold snare. Resected and retrieved. Clip                        was placed.                       - Diverticulosis in the sigmoid colon, in the                        descending colon and in the transverse colon.                       - Biopsies were taken with a cold forceps from the left                        colon for evaluation of microscopic colitis. Recommendation:       - Discharge patient to home.                       - Full liquid diet for 2 days, then advance as                        tolerated to low residue diet for 2 days. Procedure Code(s):    --- Professional ---  45385, Colonoscopy, flexible; with removal of tumor(s),                        polyp(s), or other lesion(s) by snare technique                       45380, 59, Colonoscopy, flexible; with biopsy, single                         or multiple Diagnosis Code(s):    --- Professional ---                       D12.3, Benign neoplasm of transverse colon (hepatic                        flexure or splenic flexure)                       D12.2, Benign neoplasm of ascending colon                       D12.0, Benign neoplasm of cecum                       R10.84, Generalized abdominal pain                       K52.9, Noninfective gastroenteritis and colitis,                        unspecified                       K57.30, Diverticulosis of large intestine without                        perforation or abscess without bleeding CPT copyright 2016 American Medical Association. All rights reserved. The codes documented in this report are preliminary and upon coder review may  be revised to meet current compliance requirements. Lollie Sails, MD 02/04/2017 12:52:39 PM This report has been signed electronically. Number of Addenda: 0 Note Initiated On: 02/04/2017 8:36 AM Scope Withdrawal Time: 0 hours 42 minutes 27 seconds  Total Procedure Duration: 1 hour 13 minutes 24 seconds       [facility]

## 2017-02-04 NOTE — Transfer of Care (Signed)
Immediate Anesthesia Transfer of Care Note  Patient: Sabrina Small  Procedure(s) Performed: Procedure(s): COLONOSCOPY WITH PROPOFOL (N/A)  Patient Location: PACU  Anesthesia Type:General  Level of Consciousness: sedated  Airway & Oxygen Therapy: Patient Spontanous Breathing and Patient connected to nasal cannula oxygen  Post-op Assessment: Report given to RN and Post -op Vital signs reviewed and stable  Post vital signs: Reviewed and stable  Last Vitals:  Vitals:   02/04/17 0840  BP: (!) 153/67  Pulse: 77  Resp: 20  Temp: 36.2 C    Last Pain:  Vitals:   02/04/17 0840  TempSrc: Tympanic         Complications: No apparent anesthesia complications

## 2017-02-04 NOTE — Anesthesia Preprocedure Evaluation (Signed)
Anesthesia Evaluation  Patient identified by MRN, date of birth, ID band Patient awake    Reviewed: Allergy & Precautions, NPO status , Patient's Chart, lab work & pertinent test results  Airway Mallampati: III       Dental  (+) Teeth Intact   Pulmonary former smoker,     + decreased breath sounds      Cardiovascular Exercise Tolerance: Poor  Rhythm:Regular Rate:Normal     Neuro/Psych    GI/Hepatic negative GI ROS, Neg liver ROS,   Endo/Other  diabetes, Type 1, Insulin DependentHypothyroidism Morbid obesity  Renal/GU      Musculoskeletal   Abdominal (+) + obese,   Peds negative pediatric ROS (+)  Hematology   Anesthesia Other Findings   Reproductive/Obstetrics                             Anesthesia Physical Anesthesia Plan  ASA: III  Anesthesia Plan: General   Post-op Pain Management:    Induction: Intravenous  Airway Management Planned: Natural Airway and Nasal Cannula  Additional Equipment:   Intra-op Plan:   Post-operative Plan:   Informed Consent: I have reviewed the patients History and Physical, chart, labs and discussed the procedure including the risks, benefits and alternatives for the proposed anesthesia with the patient or authorized representative who has indicated his/her understanding and acceptance.     Plan Discussed with: Surgeon  Anesthesia Plan Comments:         Anesthesia Quick Evaluation

## 2017-02-04 NOTE — Brief Op Note (Signed)
Case delayed due to lab draw.

## 2017-02-04 NOTE — H&P (Signed)
Outpatient short stay form Pre-procedure 02/04/2017 8:36 AM Sabrina Sails MD  Primary Physician: [name], Drs. making housecall  Reason for visit:  Colonoscopy  History of present illness:  Patient is a 83 year old female presenting for evaluation of possible irritable bowel syndrome. She has had a change of bowel habits being more variable with diarrhea occurring several times weekly. She seen no blood in the stools. She has had multiple abdominal surgeries. She has also been having diffuse abdominal discomfort.    Current Facility-Administered Medications:  .  0.9 %  sodium chloride infusion, , Intravenous, Continuous, Sabrina Sails, MD .  0.9 %  sodium chloride infusion, , Intravenous, Continuous, Sabrina Sails, MD  Prescriptions Prior to Admission  Medication Sig Dispense Refill Last Dose  . allopurinol (ZYLOPRIM) 100 MG tablet Take 100 mg by mouth daily.   11/24/2016 at 0800  . azithromycin (ZITHROMAX) 500 MG tablet Take 1 tablet (500 mg total) by mouth daily. 7 tablet 0   . clindamycin (CLEOCIN) 300 MG capsule Take 2 capsules (600 mg total) by mouth every 8 (eight) hours. 21 capsule 0   . DULoxetine (CYMBALTA) 60 MG capsule Take 60 mg by mouth daily.   11/24/2016 at 0700  . fenofibrate micronized (LOFIBRA) 200 MG capsule Take 200 mg by mouth daily before breakfast.   11/24/2016 at 0700  . guaiFENesin (DIABETIC TUSSIN EX) 100 MG/5ML liquid Take 200 mg by mouth 4 (four) times daily.   11/24/2016 at 0800  . insulin aspart (NOVOLOG) 100 UNIT/ML injection Inject 2 Units into the skin 4 (four) times daily. For blood sugar greater than 150.     Marland Kitchen insulin detemir (LEVEMIR) 100 UNIT/ML injection Inject 45 Units into the skin at bedtime.   11/23/2016 at 2000  . levothyroxine (SYNTHROID, LEVOTHROID) 50 MCG tablet Take 50 mcg by mouth daily before breakfast.   11/24/2016 at 0700  . linagliptin (TRADJENTA) 5 MG TABS tablet Take 5 mg by mouth daily.   11/24/2016 at 0800  . metFORMIN  (GLUCOPHAGE) 500 MG tablet Take 500 mg by mouth 2 (two) times daily with a meal.   11/23/2016 at 0800  . Multiple Vitamin (MULTIVITAMIN WITH MINERALS) TABS tablet Take 1 tablet by mouth daily.   11/24/2016 at 0700  . Pitavastatin Calcium (LIVALO) 2 MG TABS Take 1 mg by mouth daily.   11/24/2016 at 0700  . pregabalin (LYRICA) 150 MG capsule Take 150 mg by mouth 2 (two) times daily.   11/24/2016 at 0800  . Probiotic Product ([place]) CAPS Take 1 capsule by mouth daily.   11/24/2016 at 0700  . torsemide (DEMADEX) 100 MG tablet Take 100 mg by mouth daily.   11/24/2016 at 0700  . traMADol-acetaminophen (ULTRACET) 37.5-325 MG tablet Take 1 tablet by mouth every 6 (six) hours as needed (pain control).   11/23/2016 at Unknown time  . Wheat Dextrin (BENEFIBER) POWD Take 5 mLs by mouth 2 (two) times daily. Dissolve 1 teaspoon in 8oz of liquid and drink by mouth twice a day.   11/24/2016 at 0800     Allergies  Allergen Reactions  . Doxycycline     Patient cannot recall details of reaction  . Glipizide   . Iodinated Diagnostic Agents   . Lortab [Hydrocodone-Acetaminophen]   . Morphine And Related   . Omnicef [Cefdinir]     Patient cannot recall details of reaction  . Penicillins Other (See Comments)    "I blow up and pass out" Over 10 yrs ago,  thinks she did go to the hospital, but didn't remember if she was admitted. No skin necrosis.   Marland Kitchen Percocet [Oxycodone-Acetaminophen]   . Rosuvastatin   . Sulfa Antibiotics     Patient cannot recall details of reaction  . Trovafloxacin     Patient cannot recall details of reaction  . Voltaren [Diclofenac Sodium]   . Zolpidem      Past Medical History:  Diagnosis Date  . Arthritis   . Bell's palsy   . Diabetes mellitus without complication ([place])   . Diverticulitis   . Hypothyroidism     Review of systems:      Physical Exam    Heart and lungs: Regular rate and rhythm without rub or gallop, lungs are bilaterally clear.     HEENT: Normocephalic atraumatic eyes are anicteric    Other:     Pertinant exam for procedure: Soft mild discomfort to palpation generalized though particularly in the lower lateral quadrants. There is a fullness to palpation in the right upper quadrant that is almost nodular to palpation. There is no rebound bowel sounds positive normoactive    Planned proceedures: Colonoscopy and indicated procedures. I have discussed the risks benefits and complications of procedures to include not limited to bleeding, infection, perforation and the risk of sedation and the patient wishes to proceed.  It is of note that on review of labs she had a low platelet count, 108, about 3-4 months ago. This will need to be rechecked today prior to procedure.    Sabrina Sails, MD Gastroenterology 02/04/2017  8:36 AM

## 2017-02-07 ENCOUNTER — Encounter: Payer: Self-pay | Admitting: Gastroenterology

## 2017-02-07 LAB — SURGICAL PATHOLOGY

## 2017-02-10 ENCOUNTER — Other Ambulatory Visit: Payer: Self-pay | Admitting: Gastroenterology

## 2017-02-10 DIAGNOSIS — D696 Thrombocytopenia, unspecified: Secondary | ICD-10-CM

## 2017-02-18 ENCOUNTER — Ambulatory Visit: Payer: Medicare Other

## 2017-02-18 ENCOUNTER — Ambulatory Visit
Admission: RE | Admit: 2017-02-18 | Discharge: 2017-02-18 | Disposition: A | Discharge status: Home / Self Care | Payer: Medicare Other | Source: Ambulatory Visit | Attending: Gastroenterology | Admitting: Gastroenterology

## 2017-02-18 DIAGNOSIS — N200 Calculus of kidney: Secondary | ICD-10-CM | POA: Diagnosis not present

## 2017-02-18 DIAGNOSIS — D696 Thrombocytopenia, unspecified: Secondary | ICD-10-CM | POA: Diagnosis not present

## 2017-02-18 DIAGNOSIS — R932 Abnormal findings on diagnostic imaging of liver and biliary tract: Secondary | ICD-10-CM | POA: Insufficient documentation

## 2017-03-02 ENCOUNTER — Emergency Department
Admission: EM | Admit: 2017-03-02 | Discharge: 2017-03-02 | Disposition: A | Discharge status: Home / Self Care | Payer: Medicare Other | Attending: Emergency Medicine | Admitting: Emergency Medicine

## 2017-03-02 ENCOUNTER — Encounter: Payer: Self-pay | Admitting: Emergency Medicine

## 2017-03-02 DIAGNOSIS — Z87891 Personal history of nicotine dependence: Secondary | ICD-10-CM | POA: Diagnosis not present

## 2017-03-02 DIAGNOSIS — Z794 Long term (current) use of insulin: Secondary | ICD-10-CM | POA: Insufficient documentation

## 2017-03-02 DIAGNOSIS — G510 Bell's palsy: Secondary | ICD-10-CM | POA: Diagnosis not present

## 2017-03-02 DIAGNOSIS — Z79899 Other long term (current) drug therapy: Secondary | ICD-10-CM | POA: Diagnosis not present

## 2017-03-02 DIAGNOSIS — E1165 Type 2 diabetes mellitus with hyperglycemia: Secondary | ICD-10-CM | POA: Insufficient documentation

## 2017-03-02 DIAGNOSIS — R739 Hyperglycemia, unspecified: Secondary | ICD-10-CM

## 2017-03-02 DIAGNOSIS — E039 Hypothyroidism, unspecified: Secondary | ICD-10-CM | POA: Diagnosis not present

## 2017-03-02 LAB — CBC
HEMATOCRIT: 38.4 % (ref 35.0–47.0)
HEMOGLOBIN: 13.8 g/dL (ref 12.0–16.0)
MCH: 33.3 pg (ref 26.0–34.0)
MCHC: 35.8 g/dL (ref 32.0–36.0)
MCV: 92.9 fL (ref 80.0–100.0)
Platelets: 98 10*3/uL — ABNORMAL LOW (ref 150–440)
RBC: 4.14 MIL/uL (ref 3.80–5.20)
RDW: 14.4 % (ref 11.5–14.5)
WBC: 3.2 10*3/uL — AB (ref 3.6–11.0)

## 2017-03-02 LAB — URINALYSIS, COMPLETE (UACMP) WITH MICROSCOPIC
BACTERIA UA: NONE SEEN
Bilirubin Urine: NEGATIVE
Glucose, UA: >=500 mg/dL — AB
HGB URINE DIPSTICK: NEGATIVE
Ketones, ur: NEGATIVE mg/dL
Nitrite: NEGATIVE
Protein, ur: NEGATIVE mg/dL
SPECIFIC GRAVITY, URINE: 1.006 (ref 1.005–1.030)
pH: 6 (ref 5.0–8.0)

## 2017-03-02 LAB — BASIC METABOLIC PANEL
ANION GAP: 11 (ref 5–15)
BUN: 34 mg/dL — ABNORMAL HIGH (ref 6–20)
CHLORIDE: 94 mmol/L — AB (ref 101–111)
CO2: 29 mmol/L (ref 22–32)
CREATININE: 1.18 mg/dL — AB (ref 0.44–1.00)
Calcium: 9.5 mg/dL (ref 8.9–10.3)
GFR calc non Af Amer: 42 mL/min — ABNORMAL LOW (ref >60)
GFR, EST AFRICAN AMERICAN: 49 mL/min — AB (ref >60)
Glucose, Bld: 485 mg/dL — ABNORMAL HIGH (ref 65–99)
POTASSIUM: 3.3 mmol/L — AB (ref 3.5–5.1)
SODIUM: 134 mmol/L — AB (ref 135–145)

## 2017-03-02 LAB — GLUCOSE, CAPILLARY
GLUCOSE-CAPILLARY: 471 mg/dL — AB (ref 65–99)
GLUCOSE-CAPILLARY: 467 mg/dL — AB (ref 65–99)
GLUCOSE-CAPILLARY: 344 mg/dL — AB (ref 65–99)
Glucose-Capillary: 435 mg/dL — ABNORMAL HIGH (ref 65–99)

## 2017-03-02 MED ORDER — SODIUM CHLORIDE 0.9 % IV BOLUS (SEPSIS)
500.0000 mL | Freq: Once | INTRAVENOUS | Status: AC
  Administered 2017-03-02: 500 mL via INTRAVENOUS

## 2017-03-02 MED ORDER — POTASSIUM CHLORIDE CRYS ER 20 MEQ PO TBCR
40.0000 meq | EXTENDED_RELEASE_TABLET | Freq: Once | ORAL | Status: AC
  Administered 2017-03-02: 40 meq via ORAL
  Filled 2017-03-02: qty 2

## 2017-03-02 MED ORDER — INSULIN ASPART 100 UNIT/ML ~~LOC~~ SOLN: 0.0000 [IU] | Freq: Three times a day (TID) | SUBCUTANEOUS | Status: DC

## 2017-03-02 MED ORDER — SODIUM CHLORIDE 0.9 % IV SOLN
Freq: Once | INTRAVENOUS | Status: AC
  Administered 2017-03-02: 15:00:00 via INTRAVENOUS

## 2017-03-02 MED ORDER — INSULIN ASPART 100 UNIT/ML ~~LOC~~ SOLN
5.0000 [IU] | Freq: Once | SUBCUTANEOUS | Status: AC
  Administered 2017-03-02: 5 [IU] via INTRAVENOUS
  Filled 2017-03-02: qty 5

## 2017-03-02 NOTE — Consult Note (Signed)
[facility] NAME: Sabrina Small    MR#:  [id number]  DATE OF BIRTH:  02-23-1933  DATE OF ADMISSION:  03/02/2017  PRIMARY CARE PHYSICIAN: No PCP Per Patient   CONSULT REQUESTING/REFERRING PHYSICIAN: Dr. Dineen Kid  REASON FOR CONSULT: Uncontrolled diabetes with hyperglycemia  CHIEF COMPLAINT:   Chief Complaint  Patient presents with  . Hyperglycemia    HISTORY OF PRESENT ILLNESS:  Sabrina Small  is a 83 y.o. female with a known history of Diabetes, hypertension presents to the emergency room sent in from her assisted living facility due to elevated blood sugars in the 400s to 600. Patient had good control on her blood sugars to December reviewing her records. After this she has noticed that her blood sugars have been running in the 300s. 2 weeks back she had intra-articular steroid injection the right hip and blood sugars have been running more than 400 up to 600. The lowest blood sugar she has seen in the mornings in the last week has not been less than 250. She takes Levemir 48 units daily at bedtime. NovoLog 2.5 units with each meal. No numbness in her hands or feet. No changes in vision.  PAST MEDICAL HISTORY:   Past Medical History:  Diagnosis Date  . Arthritis   . Bell's palsy   . Colon polyps   . Depression   . Diabetes mellitus without complication ([place])   . Diverticulitis   . Hypothyroidism     PAST SURGICAL HISTOIRY:   Past Surgical History:  Procedure Laterality Date  . ABDOMINAL HYSTERECTOMY    . CHOLECYSTECTOMY    . COLONOSCOPY WITH PROPOFOL N/A 02/04/2017   Procedure: COLONOSCOPY WITH PROPOFOL;  Surgeon: Lollie Sails, MD;  Location: [facility] ENDOSCOPY;  Service: Endoscopy;  Laterality: N/A;  . EXCISION OF BREAST BIOPSY    . HERNIA REPAIR      SOCIAL HISTORY:   Social History  Substance Use Topics  . Smoking status: Former Research scientist (life sciences)  . Smokeless tobacco: Never Used  . Alcohol use No    FAMILY HISTORY:   No family history on file.  DRUG ALLERGIES:   Allergies  Allergen Reactions  . Doxycycline     Patient cannot recall details of reaction  . Glipizide   . Iodinated Diagnostic Agents   . Lortab [Hydrocodone-Acetaminophen]   . Morphine And Related   . Omnicef [Cefdinir]     Patient cannot recall details of reaction  . Penicillins Other (See Comments)    "I blow up and pass out" Over 10 yrs ago, thinks she did go to the hospital, but didn't remember if she was admitted. No skin necrosis.   Marland Kitchen Percocet [Oxycodone-Acetaminophen]   . Rosuvastatin   . Sulfa Antibiotics     Patient cannot recall details of reaction  . Trovafloxacin     Patient cannot recall details of reaction  . Voltaren [Diclofenac Sodium]   . Zolpidem     REVIEW OF SYSTEMS:   ROS  CONSTITUTIONAL: No fever. Fatigue EYES: No blurred or double vision.  EARS, NOSE, AND THROAT: No tinnitus or ear pain.  RESPIRATORY: No cough, shortness of breath, wheezing or hemoptysis.  CARDIOVASCULAR: No chest pain, orthopnea, edema.  GASTROINTESTINAL: No nausea, vomiting, diarrhea or abdominal pain.  GENITOURINARY: Polyuria ENDOCRINE: No polyuria, nocturia,  HEMATOLOGY: No anemia, easy bruising or bleeding SKIN: No rash or lesion. MUSCULOSKELETAL: No joint pain or arthritis.   NEUROLOGIC: No tingling, numbness, weakness.  PSYCHIATRY: No anxiety  or depression.   MEDICATIONS AT HOME:   Prior to Admission medications   Medication Sig Start Date End Date Taking? Authorizing Provider  DOCOSAHEXAENOIC ACID PO Take 2 capsules by mouth 2 (two) times daily.   Yes Historical Provider, MD  insulin aspart (NOVOLOG) 100 UNIT/ML injection Inject 5 Units into the skin 4 (four) times daily. For blood sugar greater than 200.   Yes Historical Provider, MD  levothyroxine (SYNTHROID, LEVOTHROID) 50 MCG tablet Take 50 mcg by mouth daily before breakfast.   Yes Historical Provider, MD  Multiple Vitamin (MULTIVITAMIN WITH MINERALS) TABS tablet  Take 1 tablet by mouth daily.   Yes Historical Provider, MD  Pitavastatin Calcium (LIVALO) 2 MG TABS Take 1 mg by mouth daily.   Yes Historical Provider, MD  pregabalin (LYRICA) 150 MG capsule Take 150 mg by mouth 2 (two) times daily.   Yes Historical Provider, MD  Probiotic Product ([place]) CAPS Take 1 capsule by mouth daily.   Yes Historical Provider, MD  torsemide (DEMADEX) 100 MG tablet Take 50 mg by mouth daily.    Yes Historical Provider, MD  traMADol-acetaminophen (ULTRACET) 37.5-325 MG tablet Take 1 tablet by mouth every 6 (six) hours as needed (pain control).   Yes Historical Provider, MD  Vitamin D, Ergocalciferol, (DRISDOL) 50000 units CAPS capsule Take 50,000 Units by mouth every 30 (thirty) days.    Yes Historical Provider, MD      VITAL SIGNS:  Blood pressure (!) 188/103, pulse 80, temperature 98 F (36.7 C), temperature source Oral, resp. rate 16, height 5' (1.524 m), weight 68 kg (150 lb), SpO2 98 %.  PHYSICAL EXAMINATION:  GENERAL:  83 y.o.-year-old patient lying in the bed with no acute distress. Obese EYES: Pupils equal, round, reactive to light and accommodation. No scleral icterus. Extraocular muscles intact.  HEENT: Head atraumatic, normocephalic. Oropharynx and nasopharynx clear.  NECK:  Supple, no jugular venous distention. No thyroid enlargement, no tenderness.  LUNGS: Normal breath sounds bilaterally, no wheezing, rales,rhonchi or crepitation. No use of accessory muscles of respiration.  CARDIOVASCULAR: S1, S2 normal. No murmurs, rubs, or gallops.  ABDOMEN: Soft, nontender, nondistended. Bowel sounds present. No organomegaly or mass.  EXTREMITIES: No cyanosis, or clubbing. Mild lower extremity edema NEUROLOGIC: Cranial nerves II through XII are intact. Muscle strength 5/5 in all extremities. Sensation intact. Gait not checked.  PSYCHIATRIC: The patient is alert and oriented x 3.  SKIN: No obvious rash, lesion, or ulcer.   LABORATORY PANEL:    CBC  Recent Labs Lab 03/02/17 1425  WBC 3.2*  HGB 13.8  HCT 38.4  PLT 98*   ------------------------------------------------------------------------------------------------------------------  Chemistries   Recent Labs Lab 03/02/17 1425  NA 134*  K 3.3*  CL 94*  CO2 29  GLUCOSE 485*  BUN 34*  CREATININE 1.18*  CALCIUM 9.5   ------------------------------------------------------------------------------------------------------------------  Cardiac Enzymes No results for input(s): TROPONINI in the last 168 hours. ------------------------------------------------------------------------------------------------------------------  RADIOLOGY:  No results found.  EKG:   Orders placed or performed during the hospital encounter of 11/24/16  . ED EKG  . ED EKG    IMPRESSION AND PLAN:   * Uncontrolled insulin-dependent diabetes mellitus with hyperglycemia Blood sugars have been elevated in the 200s up to 300 range recently. She received steroid injection into her right hip 2 weeks back which has worsened her blood sugars. Other and 400-600. Her blood sugars have not been less than 200 in the mornings. No hypoglycemic episodes. Increase Levemir to 56 units daily at bedtime. Increase  her mealtime insulin of NovoLog from 2.5 units to 6 units. Added sliding scale insulin. Scale provided.  No DKA or acute renal failure today.  Advised to keep log of blood sugars and call her primary care physician. She also has follow-up with endocrinology.  Discussed with emergency room physician Dr. Dineen Kid.  All the records are reviewed and case discussed with Consulting provider. Management plans discussed with the patient, family and they are in agreement.  TOTAL TIME TAKING CARE OF THIS PATIENT: 40 minutes.    Hillary Bow R M.D on 03/02/2017 at 4:24 PM  Between 7am to 6pm - Pager - [phone]  After 6pm go to [email or web address] - password [email or web address] [place]  [facility]  Office  [phone]  CC: Primary care Physician: No PCP Per Patient  Note: This dictation was prepared with Dragon dictation along with smaller phrase technology. Any transcriptional errors that result from this process are unintentional.

## 2017-03-02 NOTE — ED Notes (Signed)
FIRST NURSE: Pt from [facility] with increased blood sugar x2-3 days with increased weakness/ fatigue.

## 2017-03-02 NOTE — ED Triage Notes (Signed)
Pt to ED via POV from [facility] asst living, with c/o hyperglycemia. Pt states cbg readings have read high x1week. Pt c/o increased drowsiness xfew days. Pt current cbg 471. Pt A&Ox4

## 2017-03-02 NOTE — ED Notes (Signed)
Fluid bolus started

## 2017-03-02 NOTE — ED Provider Notes (Signed)
[facility] Emergency Department Provider Note  ____________________________________________   First MD Initiated Contact with Patient 03/02/17 1502     (approximate)  I have reviewed the triage vital signs and the nursing notes.   HISTORY  Chief Complaint Hyperglycemia   HPI Sabrina Small is a 83 y.o. female with a history of diabetes who is presenting to the emergency department today with hyperglycemia over the past month. She says that she has been taking her meds as prescribed. No change in her diet or eating more sugary foods. Says that she did have a steroid shot several weeks ago. Says that her sugars have been running in the 4 and 500s throughout the day despite her compliance with her medications. Also complaining of mild and generalized weakness.   Past Medical History:  Diagnosis Date  . Arthritis   . Bell's palsy   . Colon polyps   . Depression   . Diabetes mellitus without complication ([place])   . Diverticulitis   . Hypothyroidism     Patient Active Problem List   Diagnosis Date Noted  . Sepsis ([place]) 11/24/2016    Past Surgical History:  Procedure Laterality Date  . ABDOMINAL HYSTERECTOMY    . CHOLECYSTECTOMY    . COLONOSCOPY WITH PROPOFOL N/A 02/04/2017   Procedure: COLONOSCOPY WITH PROPOFOL;  Surgeon: Lollie Sails, MD;  Location: [facility] ENDOSCOPY;  Service: Endoscopy;  Laterality: N/A;  . EXCISION OF BREAST BIOPSY    . HERNIA REPAIR      Prior to Admission medications   Medication Sig Start Date End Date Taking? Authorizing Provider  DOCOSAHEXAENOIC ACID PO Take 2 capsules by mouth 2 (two) times daily.   Yes Historical Provider, MD  insulin aspart (NOVOLOG) 100 UNIT/ML injection Inject 5 Units into the skin 4 (four) times daily. For blood sugar greater than 200.   Yes Historical Provider, MD  levothyroxine (SYNTHROID, LEVOTHROID) 50 MCG tablet Take 50 mcg by mouth daily before breakfast.   Yes Historical Provider, MD    Multiple Vitamin (MULTIVITAMIN WITH MINERALS) TABS tablet Take 1 tablet by mouth daily.   Yes Historical Provider, MD  Pitavastatin Calcium (LIVALO) 2 MG TABS Take 1 mg by mouth daily.   Yes Historical Provider, MD  pregabalin (LYRICA) 150 MG capsule Take 150 mg by mouth 2 (two) times daily.   Yes Historical Provider, MD  Probiotic Product ([place]) CAPS Take 1 capsule by mouth daily.   Yes Historical Provider, MD  torsemide (DEMADEX) 100 MG tablet Take 50 mg by mouth daily.    Yes Historical Provider, MD  traMADol-acetaminophen (ULTRACET) 37.5-325 MG tablet Take 1 tablet by mouth every 6 (six) hours as needed (pain control).   Yes Historical Provider, MD  Vitamin D, Ergocalciferol, (DRISDOL) 50000 units CAPS capsule Take 50,000 Units by mouth every 30 (thirty) days.    Yes Historical Provider, MD    Allergies Doxycycline; Glipizide; Iodinated diagnostic agents; Lortab [hydrocodone-acetaminophen]; Morphine and related; Omnicef [cefdinir]; Penicillins; Percocet [oxycodone-acetaminophen]; Rosuvastatin; Sulfa antibiotics; Trovafloxacin; Voltaren [diclofenac sodium]; and Zolpidem  No family history on file.  Social History Social History  Substance Use Topics  . Smoking status: Former Research scientist (life sciences)  . Smokeless tobacco: Never Used  . Alcohol use No    Review of Systems Constitutional: No fever/chills Eyes: No visual changes. ENT: No sore throat. Cardiovascular: Denies chest pain. Respiratory: Denies shortness of breath. Gastrointestinal: No abdominal pain.  No nausea, no vomiting.  No diarrhea.  No constipation. Genitourinary: Negative for dysuria.  Musculoskeletal: Negative for back pain. Skin: Negative for rash. Neurological: Negative for headaches, focal weakness or numbness.  10-point ROS otherwise negative.  ____________________________________________   PHYSICAL EXAM:  VITAL SIGNS: ED Triage Vitals  Enc Vitals Group     BP 03/02/17 1421 (!) 186/76     Pulse Rate  03/02/17 1421 71     Resp 03/02/17 1421 16     Temp 03/02/17 1421 98 F (36.7 C)     Temp Source 03/02/17 1421 Oral     SpO2 03/02/17 1421 98 %     Weight 03/02/17 1423 150 lb (68 kg)     Height 03/02/17 1423 5' (1.524 m)     Head Circumference --      Peak Flow --      Pain Score 03/02/17 1420 0     Pain Loc --      Pain Edu? --      Excl. in [place]? --     Constitutional: Alert and oriented. Well appearing and in no acute distress. Eyes: Conjunctivae are normal. PERRL. EOMI. Head: Atraumatic. Nose: No congestion/rhinnorhea. Mouth/Throat: Mucous membranes are moist.   Neck: No stridor.   Cardiovascular: Normal rate, regular rhythm. Grossly normal heart sounds.   Respiratory: Normal respiratory effort.  No retractions. Lungs CTAB. Gastrointestinal: Soft and nontender. No distention.  Musculoskeletal: No lower extremity tenderness nor edema.  No joint effusions. Neurologic:  Normal speech and language. No gross focal neurologic deficits are appreciated.  Skin:  Skin is warm, dry and intact. No rash noted. Psychiatric: Mood and affect are normal. Speech and behavior are normal.  ____________________________________________   LABS (all labs ordered are listed, but only abnormal results are displayed)  Labs Reviewed  BASIC METABOLIC PANEL - Abnormal; Notable for the following:       Result Value   Sodium 134 (*)    Potassium 3.3 (*)    Chloride 94 (*)    Glucose, Bld 485 (*)    BUN 34 (*)    Creatinine, Ser 1.18 (*)    GFR calc non Af Amer 42 (*)    GFR calc Af Amer 49 (*)    All other components within normal limits  CBC - Abnormal; Notable for the following:    WBC 3.2 (*)    Platelets 98 (*)    All other components within normal limits  URINALYSIS, COMPLETE (UACMP) WITH MICROSCOPIC - Abnormal; Notable for the following:    Color, Urine STRAW (*)    APPearance CLEAR (*)    Glucose, UA >=500 (*)    Leukocytes, UA TRACE (*)    Squamous Epithelial / LPF 0-5 (*)    All  other components within normal limits  GLUCOSE, CAPILLARY - Abnormal; Notable for the following:    Glucose-Capillary 471 (*)    All other components within normal limits  GLUCOSE, CAPILLARY - Abnormal; Notable for the following:    Glucose-Capillary 467 (*)    All other components within normal limits  GLUCOSE, CAPILLARY - Abnormal; Notable for the following:    Glucose-Capillary 435 (*)    All other components within normal limits  GLUCOSE, CAPILLARY - Abnormal; Notable for the following:    Glucose-Capillary 344 (*)    All other components within normal limits  URINE CULTURE  CBG MONITORING, ED  CBG MONITORING, ED  CBG MONITORING, ED   ____________________________________________  EKG   ____________________________________________  RADIOLOGY   ____________________________________________   PROCEDURES  Procedure(s) performed:   Procedures  Critical  Care performed:   ____________________________________________   INITIAL IMPRESSION / ASSESSMENT AND PLAN / ED COURSE  Pertinent labs & imaging results that were available during my care of the patient were reviewed by me and considered in my medical decision making (see chart for details).  ----------------------------------------- 4:19 PM on 03/02/2017 -----------------------------------------  Medicine consult with Dr. Darvin Neighbours. Pending recommendations. The patient has received aspart and we will recheck her sugar thereafter. No signs of DKA. Possible mild dehydration.    ----------------------------------------- 6:30 PM on 03/02/2017 -----------------------------------------  Patient appears well. Resting in bed. Reading a book. Glucose now at 344 after fluids and insulin. The patient will be discharged home. Had a consult by Dr. Darvin Neighbours of the medicine service who is increasing the dose of Levemir and also wrote any sliding scale for the patient. The patient is administered her insulin by the nursing staff. The  new dosing she will be given with the discharge instructions to be given to the nursing staff. When this plan with the patient as well as her daughter who is at the bedside. They're understanding willing to comply. Will follow up with the endocrinologist as planned.  ____________________________________________   FINAL CLINICAL IMPRESSION(S) / ED DIAGNOSES  Hyperglycemia.    NEW MEDICATIONS STARTED DURING THIS VISIT:  New Prescriptions   No medications on file     Note:  This document was prepared using Dragon voice recognition software and may include unintentional dictation errors.    Orbie Pyo, MD 03/02/17 [phone]

## 2017-03-02 NOTE — ED Notes (Signed)
MD Shaevitz notified of CBG of 435

## 2017-03-02 NOTE — ED Notes (Signed)
Pt verbalized understanding of discharge instructions. NAD at this time. 

## 2017-03-02 NOTE — Progress Notes (Signed)
  Check blood sugars 4 times a day before meals and at bedtime. Cover with Novolog sliding scale  Increase dose of Levemir 56 units - Daily at bedtime  Novolog 6 Units - 3 times a day with each meal. Add sliding scale per blood sugars.  Sliding scale insulin with Novolog          CBG 151 - 200: 2 units   CBG 201 - 250: 3 units   CBG 251 - 300: 5 units   CBG 301 - 350: 7 units   CBG >350 9 units

## 2017-03-02 NOTE — Discharge Instructions (Addendum)
CBG 70 - 120: 0 units   CBG 121 - 150: 1 unit   CBG 151 - 200: 2 units   CBG 201 - 250: 3 units   CBG 251 - 300: 5 units   CBG 301 - 350: 7 units   CBG 351 - 400 9 units    Please add the sliding scale Novolog insulin to her pre meal Novolog dose per above scale.

## 2017-03-04 LAB — URINE CULTURE

## 2017-03-08 ENCOUNTER — Inpatient Hospital Stay: Payer: Medicare Other

## 2017-03-08 ENCOUNTER — Inpatient Hospital Stay: Payer: Medicare Other | Attending: Oncology | Admitting: Oncology

## 2017-03-08 ENCOUNTER — Encounter: Payer: Self-pay | Admitting: Oncology

## 2017-03-08 VITALS — BP 170/79 | HR 80 | Temp 97.0°F | Resp 18 | Ht 59.5 in | Wt 160.1 lb

## 2017-03-08 DIAGNOSIS — M21969 Unspecified acquired deformity of unspecified lower leg: Secondary | ICD-10-CM

## 2017-03-08 DIAGNOSIS — R21 Rash and other nonspecific skin eruption: Secondary | ICD-10-CM | POA: Diagnosis not present

## 2017-03-08 DIAGNOSIS — F329 Major depressive disorder, single episode, unspecified: Secondary | ICD-10-CM | POA: Diagnosis not present

## 2017-03-08 DIAGNOSIS — E119 Type 2 diabetes mellitus without complications: Secondary | ICD-10-CM | POA: Insufficient documentation

## 2017-03-08 DIAGNOSIS — Z794 Long term (current) use of insulin: Secondary | ICD-10-CM | POA: Insufficient documentation

## 2017-03-08 DIAGNOSIS — D696 Thrombocytopenia, unspecified: Secondary | ICD-10-CM

## 2017-03-08 DIAGNOSIS — M129 Arthropathy, unspecified: Secondary | ICD-10-CM | POA: Diagnosis not present

## 2017-03-08 DIAGNOSIS — Z87891 Personal history of nicotine dependence: Secondary | ICD-10-CM | POA: Diagnosis not present

## 2017-03-08 DIAGNOSIS — G4730 Sleep apnea, unspecified: Secondary | ICD-10-CM | POA: Insufficient documentation

## 2017-03-08 DIAGNOSIS — Z79899 Other long term (current) drug therapy: Secondary | ICD-10-CM | POA: Diagnosis not present

## 2017-03-08 DIAGNOSIS — Z86010 Personal history of colonic polyps: Secondary | ICD-10-CM | POA: Diagnosis not present

## 2017-03-08 DIAGNOSIS — E039 Hypothyroidism, unspecified: Secondary | ICD-10-CM | POA: Insufficient documentation

## 2017-03-08 DIAGNOSIS — K760 Fatty (change of) liver, not elsewhere classified: Secondary | ICD-10-CM | POA: Insufficient documentation

## 2017-03-08 DIAGNOSIS — M109 Gout, unspecified: Secondary | ICD-10-CM | POA: Diagnosis not present

## 2017-03-08 DIAGNOSIS — Z8719 Personal history of other diseases of the digestive system: Secondary | ICD-10-CM | POA: Diagnosis not present

## 2017-03-08 DIAGNOSIS — E1169 Type 2 diabetes mellitus with other specified complication: Secondary | ICD-10-CM | POA: Insufficient documentation

## 2017-03-08 LAB — CBC WITH DIFFERENTIAL/PLATELET
BASOS ABS: 0 10*3/uL (ref 0–0.1)
Basophils Relative: 1 %
EOS PCT: 3 %
Eosinophils Absolute: 0.1 10*3/uL (ref 0–0.7)
HCT: 41.5 % (ref 35.0–47.0)
Hemoglobin: 14.3 g/dL (ref 12.0–16.0)
LYMPHS PCT: 40 %
Lymphs Abs: 1.5 10*3/uL (ref 1.0–3.6)
MCH: 32.1 pg (ref 26.0–34.0)
MCHC: 34.5 g/dL (ref 32.0–36.0)
MCV: 93.1 fL (ref 80.0–100.0)
Monocytes Absolute: 0.3 10*3/uL (ref 0.2–0.9)
Monocytes Relative: 7 %
NEUTROS ABS: 1.8 10*3/uL (ref 1.4–6.5)
NEUTROS PCT: 49 %
PLATELETS: 94 10*3/uL — AB (ref 150–440)
RBC: 4.46 MIL/uL (ref 3.80–5.20)
RDW: 14.4 % (ref 11.5–14.5)
WBC: 3.7 10*3/uL (ref 3.6–11.0)

## 2017-03-08 LAB — VITAMIN B12: Vitamin B-12: 571 pg/mL (ref 180–914)

## 2017-03-08 LAB — FOLATE: Folate: 43 ng/mL (ref >5.9)

## 2017-03-08 NOTE — Progress Notes (Signed)
Hematology/Oncology Consult note [facility] [phone] [phone]  Patient Care Team: No Pcp Per Patient as PCP - General (General Practice)   Name of the patient: Sabrina Small  [id number]  Oct 27, 1933    Reason for referral- thrombocytopenia   Referring physician- Dr. Gustavo Lah  Date of visit: 03/08/17   History of presenting illness- patient is a 83 year old female who follows up with Dr. Gustavo Lah from [place] [facility] for fatty liver. Recent ultrasound of the abdomen on 02/18/2017 showed prominent liver with increased liver echogenicity. These findings are indicated of hepatic steatosis. Also recent CBC from 03/02/2017 showed white count of 3.2, H&H of 13.8/38.4 and a platelet count of 98. She has been referred to [place] for evaluation and management of thrombocytopenia. Her prior trend of CBCs as follows:  Component     Latest Ref Rng & Units 11/24/2016 11/25/2016 02/04/2017 03/02/2017  WBC     3.6 - 11.0 K/uL 6.1 5.0 3.8 3.2 (L)  RBC     3.80 - 5.20 MIL/uL 4.19 3.77 (L) 4.27 4.14  Hemoglobin     12.0 - 16.0 g/dL 13.1 11.6 (L) 13.7 13.8  HCT     35.0 - 47.0 % 37.4 33.7 (L) 39.2 38.4  MCV     80.0 - 100.0 fL 89.2 89.5 91.7 92.9  MCH     26.0 - 34.0 pg 31.2 30.7 32.1 33.3  MCHC     32.0 - 36.0 g/dL 34.9 34.3 35.0 35.8  RDW     11.5 - 14.5 % 16.0 (H) 15.6 (H) 15.0 (H) 14.4  Platelets     150 - 440 K/uL 126 (L) 108 (L) 82 (L) 98 (L)   Patient reports pain in her knee and back. Also has some fatigue. Denies any bleeding or bruising  ECOG PS- 2  Pain scale- 5   Review of systems- Review of Systems  Constitutional: Positive for malaise/fatigue. Negative for chills, fever and weight loss.  HENT: Negative for congestion, ear discharge and nosebleeds.   Eyes: Negative for blurred vision.  Respiratory: Negative for cough, hemoptysis, sputum production, shortness of breath and wheezing.   Cardiovascular: Negative for chest pain,  palpitations, orthopnea and claudication.  Gastrointestinal: Negative for abdominal pain, blood in stool, constipation, diarrhea, heartburn, melena, nausea and vomiting.  Genitourinary: Negative for dysuria, flank pain, frequency, hematuria and urgency.  Musculoskeletal: Positive for joint pain. Negative for back pain and myalgias.  Skin: Negative for rash.  Neurological: Negative for dizziness, tingling, focal weakness, seizures, weakness and headaches.  Endo/Heme/Allergies: Does not bruise/bleed easily.  Psychiatric/Behavioral: Negative for depression and suicidal ideas. The patient does not have insomnia.     Allergies  Allergen Reactions  . Doxycycline     Patient cannot recall details of reaction  . Glipizide   . Iodinated Diagnostic Agents   . Iodine Other (See Comments)  . Liraglutide Other (See Comments)  . Lortab [Hydrocodone-Acetaminophen]   . Morphine And Related   . Omnicef [Cefdinir] Other (See Comments)    Patient cannot recall details of reaction  . Penicillins Other (See Comments)    "I blow up and pass out" Over 10 yrs ago, thinks she did go to the hospital, but didn't remember if she was admitted. No skin necrosis.   Marland Kitchen Percocet [Oxycodone-Acetaminophen] Other (See Comments)  . Rosuvastatin   . Sulfa Antibiotics Other (See Comments)    Patient cannot recall details of reaction  . Trovafloxacin     Patient cannot recall details of  reaction  . Voltaren [Diclofenac Sodium] Other (See Comments)  . Zolpidem     Patient Active Problem List   Diagnosis Date Noted  . Sepsis ([place]) 11/24/2016     Past Medical History:  Diagnosis Date  . Allergy   . Arthritis   . Bell's palsy   . Colon polyps   . Depression   . Diabetes mellitus without complication ([place])   . Diverticulitis   . Gout   . Hypothyroidism   . Sleep apnea      Past Surgical History:  Procedure Laterality Date  . ABDOMINAL HYSTERECTOMY    . CHOLECYSTECTOMY    . COLONOSCOPY WITH PROPOFOL N/A  02/04/2017   Procedure: COLONOSCOPY WITH PROPOFOL;  Surgeon: Lollie Sails, MD;  [facility]: [facility] ENDOSCOPY;  Service: Endoscopy;  Laterality: N/A;  . EXCISION OF BREAST BIOPSY    . HERNIA REPAIR      Social History   Social History  . Marital status: Widowed    Spouse name: N/A  . Number of children: N/A  . Years of education: N/A   Occupational History  . Not on file.   Social History Main Topics  . Smoking status: Former Smoker    Packs/day: 2.00    Years: 30.00    Quit date: 03/08/1985  . Smokeless tobacco: Never Used  . Alcohol use No  . Drug use: No  . Sexual activity: Not on file   Other Topics Concern  . Not on file   Social History Narrative  . No narrative on file     No family h/o blood disorders   Current Outpatient Prescriptions:  .  allopurinol (ZYLOPRIM) 100 MG tablet, Take 100 mg by mouth daily., Disp: , Rfl:  .  DOCOSAHEXAENOIC ACID PO, Take 2 capsules by mouth 2 (two) times daily., Disp: , Rfl:  .  DULoxetine (CYMBALTA) 60 MG capsule, Take 60 mg by mouth daily., Disp: , Rfl:  .  fenofibrate micronized (LOFIBRA) 200 MG capsule, Take 200 mg by mouth daily., Disp: , Rfl:  .  insulin aspart (NOVOLOG) 100 UNIT/ML injection, Inject 5 Units into the skin 4 (four) times daily. For blood sugar greater than 200., Disp: , Rfl:  .  Insulin Detemir (LEVEMIR FLEXTOUCH) 100 UNIT/ML Pen, Inject 40 Units into the skin at bedtime., Disp: , Rfl:  .  levothyroxine (SYNTHROID, LEVOTHROID) 50 MCG tablet, Take 50 mcg by mouth daily before breakfast., Disp: , Rfl:  .  Multiple Vitamin (MULTIVITAMIN WITH MINERALS) TABS tablet, Take 1 tablet by mouth daily., Disp: , Rfl:  .  omega-3 acid ethyl esters (LOVAZA) 1 g capsule, Take 1 g by mouth 2 (two) times daily., Disp: , Rfl:  .  Pitavastatin Calcium (LIVALO) 2 MG TABS, Take 1 mg by mouth daily., Disp: , Rfl:  .  pregabalin (LYRICA) 150 MG capsule, Take 150 mg by mouth 2 (two) times daily., Disp: , Rfl:  .  Probiotic Product  ([place]) CAPS, Take 1 capsule by mouth daily., Disp: , Rfl:  .  sitaGLIPtin (JANUVIA) 25 MG tablet, Take 25 mg by mouth daily., Disp: , Rfl:  .  torsemide (DEMADEX) 100 MG tablet, Take 50 mg by mouth daily. , Disp: , Rfl:  .  traMADol-acetaminophen (ULTRACET) 37.5-325 MG tablet, Take 1 tablet by mouth every 6 (six) hours as needed (pain control)., Disp: , Rfl:  .  Vitamin D, Ergocalciferol, (DRISDOL) 50000 units CAPS capsule, Take 50,000 Units by mouth every 30 (thirty) days. , Disp: ,  Rfl:  .  Wheat Dextrin (BENEFIBER DRINK MIX PO), Take 5 mLs by mouth daily., Disp: , Rfl:    Physical exam:  Vitals:   03/08/17 1434  BP: (!) 170/79  Pulse: 80  Resp: 18  Temp: 97 F (36.1 C)  Weight: 160 lb 2 oz (72.6 kg)  Height: 4' 11.5" (1.511 m)   Physical Exam  Constitutional: She is oriented to person, place, and time.  Elderly woman who appears in no acute distress  HENT:  Head: Normocephalic and atraumatic.  Eyes: EOM are normal. Pupils are equal, round, and reactive to light.  Neck: Normal range of motion.  Cardiovascular: Normal rate, regular rhythm and normal heart sounds.   Pulmonary/Chest: Effort normal and breath sounds normal.  Abdominal: Soft. Bowel sounds are normal.  Musculoskeletal: She exhibits edema (b/l +1).  Neurological: She is alert and oriented to person, place, and time.  Skin: Skin is warm and dry.       CMP Latest Ref Rng & Units 03/02/2017  Glucose 65 - 99 mg/dL 485(H)  BUN 6 - 20 mg/dL 34(H)  Creatinine 0.44 - 1.00 mg/dL 1.18(H)  Sodium 135 - 145 mmol/L 134(L)  Potassium 3.5 - 5.1 mmol/L 3.3(L)  Chloride 101 - 111 mmol/L 94(L)  CO2 22 - 32 mmol/L 29  Calcium 8.9 - 10.3 mg/dL 9.5  Total Protein 6.5 - 8.1 g/dL -  Total Bilirubin 0.3 - 1.2 mg/dL -  Alkaline Phos 38 - 126 U/L -  AST 15 - 41 U/L -  ALT 14 - 54 U/L -   CBC Latest Ref Rng & Units 03/02/2017  WBC 3.6 - 11.0 K/uL 3.2(L)  Hemoglobin 12.0 - 16.0 g/dL 13.8  Hematocrit 35.0 - 47.0 %  38.4  Platelets 150 - 440 K/uL 98(L)    No images are attached to the encounter.  US Abdomen Complete  Result Date: 02/18/2017 CLINICAL DATA:  Thrombocytopenia EXAM: ABDOMEN ULTRASOUND COMPLETE COMPARISON:  None. FINDINGS: Gallbladder: Surgically absent. Common bile duct: Diameter: 3 mm. There is no intrahepatic, common hepatic, or common bile duct dilatation. Liver: No focal lesion identified. Liver measures approximately 20 cm in length. Liver echogenicity is increased. IVC: No abnormality visualized. Pancreas: Visualized portion unremarkable. There are portions of the pancreas which are obscured by gas. Spleen: Size and appearance within normal limits. Right Kidney: Length: 12.1 cm m. Echogenicity within normal limits. No mass or hydronephrosis visualized. There is a 4 mm calculus in the lower pole of the right kidney. Left Kidney: Length: 10.7 cm. Echogenicity within normal limits. No mass or hydronephrosis visualized. Abdominal aorta: No aneurysm visualized. Atherosclerotic calcification is noted in the aorta. Other findings: No demonstrable ascites. IMPRESSION: Prominent liver with increased liver echogenicity. These are findings most likely indicative of hepatic steatosis. While no focal liver lesions are evident, it must be cautioned that the sensitivity of ultrasound for detection of focal liver lesions is diminished in this circumstance. Spleen normal in size and contour.  No splenic lesions evident. Gallbladder absent. Portions of pancreas obscured by gas. Visualized portions of pancreas unremarkable. Nonobstructing 4 mm calculus lower pole right kidney. Electronically Signed   By: Lowella Grip III M.D.   On: 02/18/2017 08:42    Assessment and plan- Patient is a 83 y.o. female who has been referred to [place] for evaluation and management of thrombocytopenia  Today I will obtain a CBC with differential, pathology review of her smear, B12, folate to evaluate her thrombocytopenia further. I  would hold off on HIV  and hepatitis C testing given her age. Among the listed medications from the patient, she is on Lyrica which has a 3% chance of causing thrombocytopenia.   I will see her back in 2 weeks' time to discuss the results of her blood work. If no overt cause of thrombocytopenia is found this could be secondary to ITP. No treatment for ITP would be indicated as long as the platelet counts are more than 50 and I would be inclined to monitor this conservatively. Patient has isolated thrombocytopenia in the absence of other cytopenias. She is not anemic and her white count has been normal in the past. Most recent white count was mildly low at 3.2. As long as this remains stable she does not need a bone marrow biopsy at this time   Thank you for this kind referral and the opportunity to participate in the care of this patient   Visit Diagnosis 1. Thrombocytopenia ([place])     Dr. Randa Evens, MD, MPH [facility] at [facility] Pager- [phone] 03/08/2017 3:10 PM

## 2017-03-08 NOTE — Progress Notes (Signed)
Patient here today as new evaluation regarding thrombocytopenia.  Referred by Dr Gustavo Lah.  Patient recently had pneumonia.  States her glucose has been elevated recently also.  Went to ED last week for blood sugar over 600.

## 2017-03-09 LAB — PATHOLOGIST SMEAR REVIEW: PATH REVIEW: NORMAL

## 2017-03-22 ENCOUNTER — Telehealth: Payer: Self-pay | Admitting: *Deleted

## 2017-03-22 ENCOUNTER — Other Ambulatory Visit: Payer: Self-pay | Admitting: *Deleted

## 2017-03-22 ENCOUNTER — Inpatient Hospital Stay (HOSPITAL_BASED_OUTPATIENT_CLINIC_OR_DEPARTMENT_OTHER): Payer: Medicare Other | Admitting: Oncology

## 2017-03-22 ENCOUNTER — Encounter: Payer: Self-pay | Admitting: Oncology

## 2017-03-22 VITALS — BP 179/71 | HR 76 | Temp 97.4°F

## 2017-03-22 DIAGNOSIS — Z79899 Other long term (current) drug therapy: Secondary | ICD-10-CM

## 2017-03-22 DIAGNOSIS — G4730 Sleep apnea, unspecified: Secondary | ICD-10-CM

## 2017-03-22 DIAGNOSIS — M129 Arthropathy, unspecified: Secondary | ICD-10-CM

## 2017-03-22 DIAGNOSIS — D693 Immune thrombocytopenic purpura: Secondary | ICD-10-CM

## 2017-03-22 DIAGNOSIS — Z8719 Personal history of other diseases of the digestive system: Secondary | ICD-10-CM | POA: Diagnosis not present

## 2017-03-22 DIAGNOSIS — D696 Thrombocytopenia, unspecified: Secondary | ICD-10-CM | POA: Diagnosis not present

## 2017-03-22 DIAGNOSIS — M109 Gout, unspecified: Secondary | ICD-10-CM | POA: Diagnosis not present

## 2017-03-22 DIAGNOSIS — Z794 Long term (current) use of insulin: Secondary | ICD-10-CM

## 2017-03-22 DIAGNOSIS — Z86010 Personal history of colonic polyps: Secondary | ICD-10-CM | POA: Diagnosis not present

## 2017-03-22 DIAGNOSIS — Z87891 Personal history of nicotine dependence: Secondary | ICD-10-CM | POA: Diagnosis not present

## 2017-03-22 DIAGNOSIS — E039 Hypothyroidism, unspecified: Secondary | ICD-10-CM

## 2017-03-22 DIAGNOSIS — K760 Fatty (change of) liver, not elsewhere classified: Secondary | ICD-10-CM | POA: Diagnosis not present

## 2017-03-22 DIAGNOSIS — R21 Rash and other nonspecific skin eruption: Secondary | ICD-10-CM

## 2017-03-22 DIAGNOSIS — E119 Type 2 diabetes mellitus without complications: Secondary | ICD-10-CM | POA: Diagnosis not present

## 2017-03-22 DIAGNOSIS — F329 Major depressive disorder, single episode, unspecified: Secondary | ICD-10-CM

## 2017-03-22 NOTE — Telephone Encounter (Signed)
Called pt to let her know that b/p was elevated on today's visit. She states that she has never been told that her b/p was elevated that she can remember.  I asked if she has a way to get it checked and she said that she lives at [facility] and they check it several times a month. She will ask someone to check in tom. Am and call me with results.  They have a doctor that comes there once a week.  I gave her my phone # to call me back.

## 2017-03-22 NOTE — Progress Notes (Signed)
Hematology/Oncology Consult note [facility]  Telephone:([phone] [phone]  Patient Care Team: No Pcp Per Patient as PCP - General (General Practice)   Name of the patient: Alayla Dethlefs  [id number]  Dec 18, 1932   Date of visit: 03/22/17  Diagnosis- thrombocytopenia likely due to ITP  Chief complaint/ Reason for visit- discuss results of bloodwork  Heme/Onc history:  patient is a 83 year old female who follows up with Dr. Gustavo Lah from [place] [facility] for fatty liver. Recent ultrasound of the abdomen on 02/18/2017 showed prominent liver with increased liver echogenicity. These findings are indicated of hepatic steatosis. Also recent CBC from 03/02/2017 showed white count of 3.2, H&H of 13.8/38.4 and a platelet count of 98. She has been referred to [place] for evaluation and management of thrombocytopenia. Her platelet count has been ranging between 80's- 120's since December 2017. She denies any bleeding or bruising  Results of blood work from 03/08/2017 were as follows: CBC showed white count of 3.7 with a normal differential, H&H of 14.3/41.5 and a platelet count of 94. B12 and folate were within normal limits. Review of peripheral smear showed mild thrombocytopenia and normal lipid morphology.  Interval history- reports that she developed a rash over her right forearm and abdomen about a week ago. She has been using topical ointment and it has been getting better   Review of systems- Review of Systems  Constitutional: Negative for chills, fever, malaise/fatigue and weight loss.  HENT: Negative for congestion, ear discharge and nosebleeds.   Eyes: Negative for blurred vision.  Respiratory: Negative for cough, hemoptysis, sputum production, shortness of breath and wheezing.   Cardiovascular: Negative for chest pain, palpitations, orthopnea and claudication.  Gastrointestinal: Negative for abdominal pain, blood in stool, constipation, diarrhea,  heartburn, melena, nausea and vomiting.  Genitourinary: Negative for dysuria, flank pain, frequency, hematuria and urgency.  Musculoskeletal: Negative for back pain, joint pain and myalgias.  Skin: Negative for rash.  Neurological: Negative for dizziness, tingling, focal weakness, seizures, weakness and headaches.  Endo/Heme/Allergies: Does not bruise/bleed easily.  Psychiatric/Behavioral: Negative for depression and suicidal ideas. The patient does not have insomnia.      Current treatment- observation  Allergies  Allergen Reactions  . Doxycycline     Patient cannot recall details of reaction  . Glipizide   . Iodinated Diagnostic Agents   . Iodine Other (See Comments)  . Liraglutide Other (See Comments)  . Lortab [Hydrocodone-Acetaminophen]   . Morphine And Related   . Omnicef [Cefdinir] Other (See Comments)    Patient cannot recall details of reaction  . Penicillins Other (See Comments)    "I blow up and pass out" Over 10 yrs ago, thinks she did go to the hospital, but didn't remember if she was admitted. No skin necrosis.   Marland Kitchen Percocet [Oxycodone-Acetaminophen] Other (See Comments)  . Rosuvastatin   . Sulfa Antibiotics Other (See Comments)    Patient cannot recall details of reaction  . Trovafloxacin     Patient cannot recall details of reaction  . Voltaren [Diclofenac Sodium] Other (See Comments)  . Zolpidem      Past Medical History:  Diagnosis Date  . Allergy   . Arthritis   . Bell's palsy   . Colon polyps   . Depression   . Diabetes mellitus without complication ([place])   . Diverticulitis   . Gout   . Hypothyroidism   . Sleep apnea      Past Surgical History:  Procedure Laterality Date  .  ABDOMINAL HYSTERECTOMY    . CHOLECYSTECTOMY    . COLONOSCOPY WITH PROPOFOL N/A 02/04/2017   Procedure: COLONOSCOPY WITH PROPOFOL;  Surgeon: Christena Deem, MD;  Location: [facility] ENDOSCOPY;  Service: Endoscopy;  Laterality: N/A;  . EXCISION OF BREAST BIOPSY    . HERNIA  REPAIR      Social History   Social History  . Marital status: Widowed    Spouse name: N/A  . Number of children: N/A  . Years of education: N/A   Occupational History  . Not on file.   Social History Main Topics  . Smoking status: Former Smoker    Packs/day: 2.00    Years: 30.00    Quit date: 03/08/1985  . Smokeless tobacco: Never Used  . Alcohol use No  . Drug use: No  . Sexual activity: Not on file   Other Topics Concern  . Not on file   Social History Narrative  . No narrative on file    No family history on file.   Current Outpatient Prescriptions:  .  allopurinol (ZYLOPRIM) 100 MG tablet, Take 100 mg by mouth daily., Disp: , Rfl:  .  DOCOSAHEXAENOIC ACID PO, Take 2 capsules by mouth 2 (two) times daily., Disp: , Rfl:  .  DULoxetine (CYMBALTA) 60 MG capsule, Take 60 mg by mouth daily., Disp: , Rfl:  .  fenofibrate micronized (LOFIBRA) 200 MG capsule, Take 200 mg by mouth daily., Disp: , Rfl:  .  insulin aspart (NOVOLOG) 100 UNIT/ML injection, Inject 5 Units into the skin 4 (four) times daily. For blood sugar greater than 200., Disp: , Rfl:  .  Insulin Detemir (LEVEMIR FLEXTOUCH) 100 UNIT/ML Pen, Inject 40 Units into the skin at bedtime., Disp: , Rfl:  .  levothyroxine (SYNTHROID, LEVOTHROID) 50 MCG tablet, Take 50 mcg by mouth daily before breakfast., Disp: , Rfl:  .  Multiple Vitamin (MULTIVITAMIN WITH MINERALS) TABS tablet, Take 1 tablet by mouth daily., Disp: , Rfl:  .  omega-3 acid ethyl esters (LOVAZA) 1 g capsule, Take 1 g by mouth 2 (two) times daily., Disp: , Rfl:  .  Pitavastatin Calcium (LIVALO) 2 MG TABS, Take 1 mg by mouth daily., Disp: , Rfl:  .  pregabalin (LYRICA) 150 MG capsule, Take 150 mg by mouth 2 (two) times daily., Disp: , Rfl:  .  Probiotic Product ([place] [employer]) CAPS, Take 1 capsule by mouth daily., Disp: , Rfl:  .  sitaGLIPtin (JANUVIA) 25 MG tablet, Take 25 mg by mouth daily., Disp: , Rfl:  .  torsemide (DEMADEX) 100 MG tablet,  Take 50 mg by mouth daily. , Disp: , Rfl:  .  traMADol-acetaminophen (ULTRACET) 37.5-325 MG tablet, Take 1 tablet by mouth every 6 (six) hours as needed (pain control)., Disp: , Rfl:  .  Vitamin D, Ergocalciferol, (DRISDOL) 50000 units CAPS capsule, Take 50,000 Units by mouth every 30 (thirty) days. , Disp: , Rfl:  .  Wheat Dextrin (BENEFIBER DRINK MIX PO), Take 5 mLs by mouth daily., Disp: , Rfl:   Physical exam:  Vitals:   03/22/17 1419  BP: (!) 179/71  Pulse: 76  Temp: 97.4 F (36.3 C)  TempSrc: Tympanic   Physical Exam  Constitutional: She is oriented to person, place, and time and well-developed, well-nourished, and in no distress.  HENT:  Head: Normocephalic and atraumatic.  Eyes: EOM are normal. Pupils are equal, round, and reactive to light.  Neck: Normal range of motion.  Cardiovascular: Normal rate, regular rhythm and normal heart  sounds.   Pulmonary/Chest: Effort normal and breath sounds normal.  Abdominal: Soft. Bowel sounds are normal.  Neurological: She is alert and oriented to person, place, and time.  Skin: Skin is warm and dry.  3 areas of macular lacy erythematous rash noted over right forearm, lowe abdomen and left thigh. Keratotic hard swelling over scalp     CMP Latest Ref Rng & Units 03/02/2017  Glucose 65 - 99 mg/dL 485(H)  BUN 6 - 20 mg/dL 34(H)  Creatinine 0.44 - 1.00 mg/dL 1.18(H)  Sodium 135 - 145 mmol/L 134(L)  Potassium 3.5 - 5.1 mmol/L 3.3(L)  Chloride 101 - 111 mmol/L 94(L)  CO2 22 - 32 mmol/L 29  Calcium 8.9 - 10.3 mg/dL 9.5  Total Protein 6.5 - 8.1 g/dL -  Total Bilirubin 0.3 - 1.2 mg/dL -  Alkaline Phos 38 - 126 U/L -  AST 15 - 41 U/L -  ALT 14 - 54 U/L -   CBC Latest Ref Rng & Units 03/08/2017  WBC 3.6 - 11.0 K/uL 3.7  Hemoglobin 12.0 - 16.0 g/dL 14.3  Hematocrit 35.0 - 47.0 % 41.5  Platelets 150 - 440 K/uL 94(L)    Assessment and plan- Patient is a 83 y.o. female referred to [place] for thrombocytopenia likely secondary to ITP.  Patient  has mild isolated thrombocytopenia likely secondary to ITP or possibly drug-induced secondary to Lyrica. At this time I would continue to monitor her platelet counts conservatively without any further intervention. I will see her back in 6 months time with a repeat CBC. Another interim cbc in 3 months. If she were to develop other cytopenias I will consider a bone marrow biopsy at that time. She will call us in the interim if there were any questions or concerns or if she develops any sudden onset bleeding or bruising  Skin rash- etiology unclear. No new perfumes, lotions or detergents. She is seeing dermatology next week.   Visit Diagnosis 1. Chronic ITP (idiopathic thrombocytopenia) (H[facility])      Dr. Randa Evens, MD, MPH [facility] at [facility] Pager- [phone] 03/22/2017 2:25 PM

## 2017-03-22 NOTE — Progress Notes (Signed)
Patient here for follow up. She has developed a rash that comes and goes over her entire body. Her blood sugar have been high.

## 2017-03-23 ENCOUNTER — Telehealth: Payer: Self-pay | Admitting: *Deleted

## 2017-03-23 NOTE — Telephone Encounter (Signed)
I got a call from [facility] that she wants my fax number to send pt's b/p for me to review.  I called back to the number left and left message stating that I wanted pt b/p to be checked again at [facility] and if her b/p cont. To be up the pt states that there is a doctor there to help her with if it is a problem. Could someone call me back,

## 2017-03-25 ENCOUNTER — Telehealth: Payer: Self-pay | Admitting: *Deleted

## 2017-03-25 NOTE — Telephone Encounter (Signed)
Called and spoke to [place] staff member at [facility] [employer] and she took b/p for pt for me because typically they only take VS every weekend. Her b/p today was 176/85 which was elevated like in the office this week. Sonia Side states that they went to eat seafood today and that is why it is elevated.  The usual b/p on a running chart she is never over 135 on top and below 80 on bottom. They have an on call md that services the patients 2 days a week and if her b/p is elevated they would call the service to come in.  I thanked her to help me and they are capable of getting pt help if her b/p is elevated when they check it then they will get in touch with doctor.

## 2017-06-07 NOTE — Telephone Encounter (Signed)
done

## 2017-06-13 ENCOUNTER — Emergency Department: Payer: Medicare Other

## 2017-06-13 ENCOUNTER — Encounter: Payer: Self-pay | Admitting: Emergency Medicine

## 2017-06-13 ENCOUNTER — Emergency Department
Admission: EM | Admit: 2017-06-13 | Discharge: 2017-06-13 | Disposition: A | Discharge status: Home / Self Care | Payer: Medicare Other | Attending: Emergency Medicine | Admitting: Emergency Medicine

## 2017-06-13 DIAGNOSIS — Z79899 Other long term (current) drug therapy: Secondary | ICD-10-CM | POA: Diagnosis not present

## 2017-06-13 DIAGNOSIS — E079 Disorder of thyroid, unspecified: Secondary | ICD-10-CM | POA: Insufficient documentation

## 2017-06-13 DIAGNOSIS — Z794 Long term (current) use of insulin: Secondary | ICD-10-CM | POA: Diagnosis not present

## 2017-06-13 DIAGNOSIS — E119 Type 2 diabetes mellitus without complications: Secondary | ICD-10-CM | POA: Diagnosis not present

## 2017-06-13 DIAGNOSIS — W19XXXA Unspecified fall, initial encounter: Secondary | ICD-10-CM

## 2017-06-13 DIAGNOSIS — W010XXA Fall on same level from slipping, tripping and stumbling without subsequent striking against object, initial encounter: Secondary | ICD-10-CM | POA: Insufficient documentation

## 2017-06-13 DIAGNOSIS — S0003XA Contusion of scalp, initial encounter: Secondary | ICD-10-CM | POA: Diagnosis not present

## 2017-06-13 DIAGNOSIS — Y998 Other external cause status: Secondary | ICD-10-CM | POA: Insufficient documentation

## 2017-06-13 DIAGNOSIS — Y9301 Activity, walking, marching and hiking: Secondary | ICD-10-CM | POA: Diagnosis not present

## 2017-06-13 DIAGNOSIS — S0990XA Unspecified injury of head, initial encounter: Secondary | ICD-10-CM | POA: Diagnosis present

## 2017-06-13 DIAGNOSIS — Y92129 Unspecified place in nursing home as the place of occurrence of the external cause: Secondary | ICD-10-CM | POA: Diagnosis not present

## 2017-06-13 DIAGNOSIS — Z87891 Personal history of nicotine dependence: Secondary | ICD-10-CM | POA: Insufficient documentation

## 2017-06-13 DIAGNOSIS — T148XXA Other injury of unspecified body region, initial encounter: Secondary | ICD-10-CM

## 2017-06-13 NOTE — ED Provider Notes (Signed)
[facility] Emergency Department Provider Note   ____________________________________________   I have reviewed the triage vital signs and the nursing notes.   HISTORY  Chief Complaint Fall   History limited by: Not Limited   HPI Sabrina Small is a 83 y.o. female who presents to the emergency department today via EMS after a fall. The fall occurred roughly 3.5 hours prior to presentation. The patient was going to the bathroom when she fell. Did hit her head against the wall. Did not black out or have LOC. States she was not able to get up off the floor. She has not had any headache since. Also complaining of some tail bone pain with movement after the fall. She denies any chest pain, shortness of breath. No recent fevers or illness.   Past Medical History:  Diagnosis Date  . Allergy   . Arthritis   . Bell's palsy   . Colon polyps   . Depression   . Diabetes mellitus without complication ([place])   . Diverticulitis   . Gout   . Hypothyroidism   . Sleep apnea     Patient Active Problem List   Diagnosis Date Noted  . Chronic ITP (idiopathic thrombocytopenia) (HCC) 03/22/2017  . Diabetes ([place]) 03/08/2017  . Gout 03/08/2017  . Hypothyroidism 03/08/2017  . Sepsis ([place]) 11/24/2016    Past Surgical History:  Procedure Laterality Date  . ABDOMINAL HYSTERECTOMY    . CHOLECYSTECTOMY    . COLONOSCOPY WITH PROPOFOL N/A 02/04/2017   Procedure: COLONOSCOPY WITH PROPOFOL;  Surgeon: Lollie Sails, MD;  Location: [facility] ENDOSCOPY;  Service: Endoscopy;  Laterality: N/A;  . EXCISION OF BREAST BIOPSY    . HERNIA REPAIR      Prior to Admission medications   Medication Sig Start Date End Date Taking? Authorizing Provider  allopurinol (ZYLOPRIM) 100 MG tablet Take 100 mg by mouth daily.    [provider]  DOCOSAHEXAENOIC ACID PO Take 2 capsules by mouth 2 (two) times daily.    [provider]  DULoxetine (CYMBALTA) 60 MG capsule Take 60 mg by  mouth daily.    [provider]  fenofibrate micronized (LOFIBRA) 200 MG capsule Take 200 mg by mouth daily.    [provider]  insulin aspart (NOVOLOG) 100 UNIT/ML injection Inject 5 Units into the skin 4 (four) times daily. For blood sugar greater than 200.    [provider]  Insulin Detemir (LEVEMIR FLEXTOUCH) 100 UNIT/ML Pen Inject 40 Units into the skin at bedtime.    [provider]  levothyroxine (SYNTHROID, LEVOTHROID) 50 MCG tablet Take 50 mcg by mouth daily before breakfast.    [provider]  Multiple Vitamin (MULTIVITAMIN WITH MINERALS) TABS tablet Take 1 tablet by mouth daily.    [provider]  omega-3 acid ethyl esters (LOVAZA) 1 g capsule Take 1 g by mouth 2 (two) times daily.    [provider]  Pitavastatin Calcium (LIVALO) 2 MG TABS Take 1 mg by mouth daily.    [provider]  pregabalin (LYRICA) 150 MG capsule Take 150 mg by mouth 2 (two) times daily.    [provider]  Probiotic Product ([place]) CAPS Take 1 capsule by mouth daily.    [provider]  sitaGLIPtin (JANUVIA) 25 MG tablet Take 25 mg by mouth daily.    [provider]  torsemide (DEMADEX) 100 MG tablet Take 50 mg by mouth daily.     [provider]  traMADol-acetaminophen (ULTRACET) 37.5-325 MG tablet Take 1 tablet by mouth every 6 (six) hours as needed (pain control).    [provider]  Vitamin D, Ergocalciferol, (DRISDOL) 50000 units CAPS capsule Take 50,000 Units by mouth every 30 (thirty) days.     [provider]  Wheat Dextrin (BENEFIBER DRINK MIX PO) Take 5 mLs by mouth daily.    [provider]    Allergies Doxycycline; Glipizide; Iodinated diagnostic agents; Iodine; Liraglutide; Lortab [hydrocodone-acetaminophen]; Morphine and related; Omnicef [cefdinir]; Penicillins; Percocet [oxycodone-acetaminophen]; Rosuvastatin; Sulfa antibiotics; Trovafloxacin;  Voltaren [diclofenac sodium]; and Zolpidem  No family history on file.  Social History Social History  Substance Use Topics  . Smoking status: Former Smoker    Packs/day: 2.00    Years: 30.00    Quit date: 03/08/1985  . Smokeless tobacco: Never Used  . Alcohol use No    Review of Systems Constitutional: No fever/chills Eyes: No visual changes. ENT: No sore throat. Cardiovascular: Denies chest pain. Respiratory: Denies shortness of breath. Gastrointestinal: No abdominal pain.  No nausea, no vomiting.  No diarrhea.   Genitourinary: Negative for dysuria. Musculoskeletal: Positive for tailbone pain. Skin: Positive for swelling to right head. Neurological: Negative for headaches, focal weakness or numbness.  ____________________________________________   PHYSICAL EXAM:  VITAL SIGNS: ED Triage Vitals  Enc Vitals Group     BP 06/13/17 0757 (!) 191/86     Pulse Rate 06/13/17 0757 74     Resp 06/13/17 0757 18     Temp 06/13/17 0757 98.5 F (36.9 C)     Temp Source 06/13/17 0757 Oral     SpO2 06/13/17 0757 98 %     Weight 06/13/17 0759 150 lb (68 kg)     Height 06/13/17 0759 4\' 11"  (1.499 m)     Head Circumference --      Peak Flow --      Pain Score 06/13/17 0758 7   Constitutional: Alert and oriented. Well appearing and in no distress. Eyes: Conjunctivae are normal.  ENT   Head: Normocephalic.   Nose: No congestion/rhinnorhea.   Mouth/Throat: Mucous membranes are moist.   Neck: No stridor. No midline tenderness. Hematological/Lymphatic/Immunilogical: No cervical lymphadenopathy. Cardiovascular: Normal rate, regular rhythm.  No murmurs, rubs, or gallops.  Respiratory: Normal respiratory effort without tachypnea nor retractions. Breath sounds are clear and equal bilaterally. No wheezes/rales/rhonchi. Gastrointestinal: Soft and non tender. No rebound. No guarding.  Genitourinary: Deferred Musculoskeletal: Normal range of motion in all extremities. No lower  extremity edema. Neurologic:  Normal speech and language. No gross focal neurologic deficits are appreciated.  Skin:  Hematoma to right temporal scalp, small abrasion. Psychiatric: Mood and affect are normal. Speech and behavior are normal. Patient exhibits appropriate insight and judgment.  ____________________________________________    LABS (pertinent positives/negatives)  Labs Reviewed - No data to display   ____________________________________________   EKG  None  ____________________________________________    RADIOLOGY  CT head IMPRESSION:  Large right posterior parietal scalp hematoma. Mild chronic ischemic  white matter disease. No acute intracranial abnormality seen.    ___________________________________________   PROCEDURES  Procedures  ____________________________________________   INITIAL IMPRESSION / ASSESSMENT AND PLAN / ED COURSE  Pertinent labs & imaging results that were available during my care of the patient were reviewed by me and considered in my medical decision making (see chart for details).  Patient presented to the emergency department today after a fall and suffered a hematoma to her scalp. CT scan was negative.  ____________________________________________  FINAL CLINICAL IMPRESSION(S) / ED DIAGNOSES  Final diagnoses:  Fall, initial encounter  Hematoma     Note: This dictation was prepared with Dragon dictation. Any transcriptional errors that result from this process are unintentional     Nance Pear, MD 06/13/17 1143

## 2017-06-13 NOTE — Discharge Instructions (Signed)
Please seek medical attention for any high fevers, chest pain, shortness of breath, change in behavior, persistent vomiting, bloody stool or any other new or concerning symptoms.  

## 2017-06-13 NOTE — ED Triage Notes (Signed)
Pt arrived via EMS from [facility] for reports of unwitnessed fall. Pt reports getting up to bathroom at approximately 0430 and fell hitting head on wall. EMS reports staff found pt on floor at about 0530. Pt alert and oriented on arrival. Reports pain in head and tailbone. Pt reports tailbone pain chronic. EMS reports VSS.

## 2017-06-20 ENCOUNTER — Telehealth: Payer: Self-pay | Admitting: *Deleted

## 2017-06-20 NOTE — Telephone Encounter (Signed)
error 

## 2017-06-21 ENCOUNTER — Inpatient Hospital Stay: Payer: Medicare Other | Attending: Oncology

## 2017-06-21 DIAGNOSIS — D693 Immune thrombocytopenic purpura: Secondary | ICD-10-CM | POA: Insufficient documentation

## 2017-06-21 LAB — CBC WITH DIFFERENTIAL/PLATELET
Basophils Absolute: 0 10*3/uL (ref 0–0.1)
Basophils Relative: 1 %
Eosinophils Absolute: 0.2 10*3/uL (ref 0–0.7)
Eosinophils Relative: 4 %
HEMATOCRIT: 39.2 % (ref 35.0–47.0)
Hemoglobin: 13.6 g/dL (ref 12.0–16.0)
LYMPHS PCT: 38 %
Lymphs Abs: 1.4 10*3/uL (ref 1.0–3.6)
MCH: 32 pg (ref 26.0–34.0)
MCHC: 34.6 g/dL (ref 32.0–36.0)
MCV: 92.5 fL (ref 80.0–100.0)
MONO ABS: 0.3 10*3/uL (ref 0.2–0.9)
MONOS PCT: 7 %
NEUTROS ABS: 1.8 10*3/uL (ref 1.4–6.5)
Neutrophils Relative %: 50 %
Platelets: 91 10*3/uL — ABNORMAL LOW (ref 150–440)
RBC: 4.24 MIL/uL (ref 3.80–5.20)
RDW: 15.4 % — AB (ref 11.5–14.5)
WBC: 3.6 10*3/uL (ref 3.6–11.0)

## 2017-09-06 DIAGNOSIS — E1169 Type 2 diabetes mellitus with other specified complication: Secondary | ICD-10-CM | POA: Insufficient documentation

## 2017-09-06 DIAGNOSIS — E559 Vitamin D deficiency, unspecified: Secondary | ICD-10-CM | POA: Insufficient documentation

## 2017-09-06 DIAGNOSIS — E785 Hyperlipidemia, unspecified: Secondary | ICD-10-CM

## 2017-09-27 ENCOUNTER — Inpatient Hospital Stay: Payer: Medicare Other | Attending: Oncology | Admitting: Oncology

## 2017-09-27 ENCOUNTER — Inpatient Hospital Stay: Payer: Medicare Other

## 2017-09-27 VITALS — BP 147/69 | HR 69 | Temp 98.5°F | Resp 16 | Wt 160.0 lb

## 2017-09-27 DIAGNOSIS — K760 Fatty (change of) liver, not elsewhere classified: Secondary | ICD-10-CM

## 2017-09-27 DIAGNOSIS — D693 Immune thrombocytopenic purpura: Secondary | ICD-10-CM

## 2017-09-27 DIAGNOSIS — Z87891 Personal history of nicotine dependence: Secondary | ICD-10-CM

## 2017-09-27 DIAGNOSIS — M549 Dorsalgia, unspecified: Secondary | ICD-10-CM | POA: Diagnosis not present

## 2017-09-27 DIAGNOSIS — G8929 Other chronic pain: Secondary | ICD-10-CM

## 2017-09-27 DIAGNOSIS — Z794 Long term (current) use of insulin: Secondary | ICD-10-CM

## 2017-09-27 DIAGNOSIS — G4730 Sleep apnea, unspecified: Secondary | ICD-10-CM | POA: Diagnosis not present

## 2017-09-27 DIAGNOSIS — Z79899 Other long term (current) drug therapy: Secondary | ICD-10-CM

## 2017-09-27 DIAGNOSIS — M1990 Unspecified osteoarthritis, unspecified site: Secondary | ICD-10-CM | POA: Diagnosis not present

## 2017-09-27 DIAGNOSIS — Z86010 Personal history of colonic polyps: Secondary | ICD-10-CM | POA: Diagnosis not present

## 2017-09-27 DIAGNOSIS — E119 Type 2 diabetes mellitus without complications: Secondary | ICD-10-CM

## 2017-09-27 DIAGNOSIS — E039 Hypothyroidism, unspecified: Secondary | ICD-10-CM | POA: Diagnosis not present

## 2017-09-27 LAB — CBC WITH DIFFERENTIAL/PLATELET
Basophils Absolute: 0.1 10*3/uL (ref 0–0.1)
Basophils Relative: 1 %
EOS PCT: 3 %
Eosinophils Absolute: 0.2 10*3/uL (ref 0–0.7)
HCT: 39.8 % (ref 35.0–47.0)
Hemoglobin: 13.9 g/dL (ref 12.0–16.0)
LYMPHS ABS: 1.8 10*3/uL (ref 1.0–3.6)
LYMPHS PCT: 35 %
MCH: 33.1 pg (ref 26.0–34.0)
MCHC: 34.9 g/dL (ref 32.0–36.0)
MCV: 94.7 fL (ref 80.0–100.0)
MONO ABS: 0.4 10*3/uL (ref 0.2–0.9)
Monocytes Relative: 7 %
Neutro Abs: 2.7 10*3/uL (ref 1.4–6.5)
Neutrophils Relative %: 54 %
PLATELETS: 105 10*3/uL — AB (ref 150–440)
RBC: 4.21 MIL/uL (ref 3.80–5.20)
RDW: 15.2 % — AB (ref 11.5–14.5)
WBC: 5.1 10*3/uL (ref 3.6–11.0)

## 2017-09-27 NOTE — Progress Notes (Signed)
Hematology/Oncology Consult note [facility]  [phone] [phone]  Patient Care Team: Patient, No Pcp Per as PCP - General (General Practice)   Name of the patient: Sabrina Small  [id number]  05-29-1933   Date of visit: 09/27/17  Diagnosis- thrombocytopenia likely due to ITP  Chief complaint/ Reason for visit- routine f/u of thrombocytopenia  Heme/Onc history: patient is a 83 year old female who follows up with Dr. Gustavo Lah from [facility] for fatty liver. Recent ultrasound of the abdomen on 02/18/2017 showed prominent liver with increased liver echogenicity. These findings are indicated of hepatic steatosis. Also recent CBC from 03/02/2017 showed white count of 3.2, H&H of 13.8/38.4 and a platelet count of 98. She has been referred to [place] for evaluation and management of thrombocytopenia. Her platelet count has been ranging between 80's- 120's since December 2017. She denies any bleeding or bruising  Results of blood work from 03/08/2017 were as follows: CBC showed white count of 3.7 with a normal differential, H&H of 14.3/41.5 and a platelet count of 94. B12 and folate were within normal limits. Review of peripheral smear showed mild thrombocytopenia and normal lipid morphology.   Interval history- reports chronic back pain. Overall she has been doing well and denies any acute issues. Denies any blood in stools or urine. Occasional bruising over forearms  ECOG PS- 1 Pain scale- 0   Review of systems- Review of Systems  Constitutional: Negative for chills, fever, malaise/fatigue and weight loss.  HENT: Negative for congestion, ear discharge and nosebleeds.   Eyes: Negative for blurred vision.  Respiratory: Negative for cough, hemoptysis, sputum production, shortness of breath and wheezing.   Cardiovascular: Negative for chest pain, palpitations, orthopnea and claudication.  Gastrointestinal: Negative for abdominal pain, blood  in stool, constipation, diarrhea, heartburn, melena, nausea and vomiting.  Genitourinary: Negative for dysuria, flank pain, frequency, hematuria and urgency.  Musculoskeletal: Positive for back pain. Negative for joint pain and myalgias.  Skin: Negative for rash.  Neurological: Negative for dizziness, tingling, focal weakness, seizures, weakness and headaches.  Endo/Heme/Allergies: Does not bruise/bleed easily.  Psychiatric/Behavioral: Negative for depression and suicidal ideas. The patient does not have insomnia.      Allergies  Allergen Reactions  . Doxycycline     Patient cannot recall details of reaction  . Dust Mite Mixed Allergen Ext [Mite (D. Farinae)]   . Glipizide   . Iodinated Diagnostic Agents   . Iodine Other (See Comments)  . Liraglutide Other (See Comments)  . Lortab [Hydrocodone-Acetaminophen]   . Morphine And Related   . Omnicef [Cefdinir] Other (See Comments)    Patient cannot recall details of reaction  . Other     Cat dander  . Penicillins Other (See Comments)    Has patient had a PCN reaction causing immediate rash, facial/tongue/throat swelling, SOB or lightheadedness with hypotension: Unknown Has patient had a PCN reaction causing severe rash involving mucus membranes or skin necrosis: Unknown Has patient had a PCN reaction that required hospitalization: Unknown Has patient had a PCN reaction occurring within the last 10 years: No If all of the above answers are "NO", then may proceed with Cephalosporin use. "I blow up and pass out" Over 10 yrs ago.  Marland Kitchen Percocet [Oxycodone-Acetaminophen] Other (See Comments)  . Rosuvastatin   . Sulfa Antibiotics Other (See Comments)    Patient cannot recall details of reaction  . Trovafloxacin     Patient cannot recall details of reaction  . Voltaren [Diclofenac Sodium] Other (See  Comments)  . Zolpidem      Past Medical History:  Diagnosis Date  . Allergy   . Arthritis   . Bell's palsy   . Colon polyps   .  Depression   . Diabetes mellitus without complication ([place])   . Diverticulitis   . Gout   . Hypothyroidism   . Sleep apnea      Past Surgical History:  Procedure Laterality Date  . ABDOMINAL HYSTERECTOMY    . CHOLECYSTECTOMY    . COLONOSCOPY WITH PROPOFOL N/A 02/04/2017   Procedure: COLONOSCOPY WITH PROPOFOL;  Surgeon: Lollie Sails, MD;  Location: [facility] ENDOSCOPY;  Service: Endoscopy;  Laterality: N/A;  . EXCISION OF BREAST BIOPSY    . HERNIA REPAIR      Social History   Social History  . Marital status: Widowed    Spouse name: N/A  . Number of children: N/A  . Years of education: N/A   Occupational History  . Not on file.   Social History Main Topics  . Smoking status: Former Smoker    Packs/day: 2.00    Years: 30.00    Quit date: 03/08/1985  . Smokeless tobacco: Never Used  . Alcohol use No  . Drug use: No  . Sexual activity: Not on file   Other Topics Concern  . Not on file   Social History Narrative  . No narrative on file    No family history on file.   Current Outpatient Prescriptions:  .  buPROPion (WELLBUTRIN XL) 150 MG 24 hr tablet, Take 150 mg by mouth daily., Disp: , Rfl:  .  DULoxetine (CYMBALTA) 60 MG capsule, Take 60 mg by mouth daily., Disp: , Rfl:  .  fenofibrate micronized (LOFIBRA) 200 MG capsule, Take 200 mg by mouth daily., Disp: , Rfl:  .  insulin aspart (NOVOLOG) 100 UNIT/ML injection, Inject 2 Units into the skin 3 (three) times daily before meals. As needed if BG >200, Disp: , Rfl:  .  Insulin Detemir (LEVEMIR FLEXTOUCH) 100 UNIT/ML Pen, Inject 42 Units into the skin at bedtime. , Disp: , Rfl:  .  levothyroxine (SYNTHROID, LEVOTHROID) 50 MCG tablet, Take 50 mcg by mouth daily before breakfast., Disp: , Rfl:  .  Lifitegrast (XIIDRA) 5 % SOLN, Apply 1 drop to eye 2 (two) times daily., Disp: , Rfl:  .  Multiple Vitamin (MULTIVITAMIN WITH MINERALS) TABS tablet, Take 1 tablet by mouth daily., Disp: , Rfl:  .  Omega-3 Fatty Acids (FISH OIL)  1000 MG CAPS, Take 1,000 mg by mouth 4 (four) times daily., Disp: , Rfl:  .  Pitavastatin Calcium (LIVALO) 2 MG TABS, Take 2 mg by mouth every evening. , Disp: , Rfl:  .  pregabalin (LYRICA) 150 MG capsule, Take 150 mg by mouth 2 (two) times daily., Disp: , Rfl:  .  traMADol-acetaminophen (ULTRACET) 37.5-325 MG tablet, Take 1 tablet by mouth 2 (two) times daily as needed for moderate pain. At 2 pm and 8 pm, Disp: , Rfl:  .  Vitamin D, Ergocalciferol, (DRISDOL) 50000 units CAPS capsule, Take 50,000 Units by mouth every 30 (thirty) days. , Disp: , Rfl:   Physical exam:  Vitals:   09/27/17 1437 09/27/17 1446  BP: (!) 147/69   Pulse: 69   Resp: 16   Temp:  98.5 F (36.9 C)  TempSrc:  Oral  Weight: 160 lb (72.6 kg)    Physical Exam  Constitutional: She is oriented to person, place, and time.  Elderly frail woman in  no acute distress  HENT:  Head: Normocephalic and atraumatic.  Eyes: Pupils are equal, round, and reactive to light. EOM are normal.  Neck: Normal range of motion.  Cardiovascular: Normal rate, regular rhythm and normal heart sounds.   Pulmonary/Chest: Effort normal and breath sounds normal.  Abdominal: Soft. Bowel sounds are normal.  Musculoskeletal: She exhibits edema.  Neurological: She is alert and oriented to person, place, and time.  Skin: Skin is warm and dry.     CMP Latest Ref Rng & Units 03/02/2017  Glucose 65 - 99 mg/dL 485(H)  BUN 6 - 20 mg/dL 34(H)  Creatinine 0.44 - 1.00 mg/dL 1.18(H)  Sodium 135 - 145 mmol/L 134(L)  Potassium 3.5 - 5.1 mmol/L 3.3(L)  Chloride 101 - 111 mmol/L 94(L)  CO2 22 - 32 mmol/L 29  Calcium 8.9 - 10.3 mg/dL 9.5  Total Protein 6.5 - 8.1 g/dL -  Total Bilirubin 0.3 - 1.2 mg/dL -  Alkaline Phos 38 - 126 U/L -  AST 15 - 41 U/L -  ALT 14 - 54 U/L -   CBC Latest Ref Rng & Units 09/27/2017  WBC 3.6 - 11.0 K/uL 5.1  Hemoglobin 12.0 - 16.0 g/dL 13.9  Hematocrit 35.0 - 47.0 % 39.8  Platelets 150 - 440 K/uL 105(L)      Assessment  and plan- Patient is a 83 y.o. female with mild isolated thrombocytopenia likely due to ITP  Platelet counts stable between 90-100 over last 1 year. No other cytopenias other than mild thrombocytopenia. Continue to monitor. No treatment indicated unless platelet count <30. Repeat cbc with diff in 6 months and 1 year and I will see her back in 1 year   Visit Diagnosis 1. Chronic ITP (idiopathic thrombocytopenia) (HCC)      Dr. Randa Evens, MD, MPH [facility] Pager- [phone] 09/27/2017 3:25 PM

## 2017-09-27 NOTE — Progress Notes (Signed)
Patient is here for follow up with labs. She states that she has right hip pain associated with a fall back in July 2018; otherwise she feels well today.

## 2018-01-22 ENCOUNTER — Emergency Department: Payer: Medicare Other

## 2018-01-22 ENCOUNTER — Emergency Department
Admission: EM | Admit: 2018-01-22 | Discharge: 2018-01-22 | Disposition: A | Discharge status: Home / Self Care | Payer: Medicare Other | Attending: Emergency Medicine | Admitting: Emergency Medicine

## 2018-01-22 ENCOUNTER — Other Ambulatory Visit: Payer: Self-pay

## 2018-01-22 DIAGNOSIS — L03115 Cellulitis of right lower limb: Secondary | ICD-10-CM | POA: Insufficient documentation

## 2018-01-22 DIAGNOSIS — E119 Type 2 diabetes mellitus without complications: Secondary | ICD-10-CM | POA: Diagnosis not present

## 2018-01-22 DIAGNOSIS — E039 Hypothyroidism, unspecified: Secondary | ICD-10-CM | POA: Insufficient documentation

## 2018-01-22 DIAGNOSIS — Z87891 Personal history of nicotine dependence: Secondary | ICD-10-CM | POA: Insufficient documentation

## 2018-01-22 DIAGNOSIS — Z794 Long term (current) use of insulin: Secondary | ICD-10-CM | POA: Diagnosis not present

## 2018-01-22 DIAGNOSIS — Z79899 Other long term (current) drug therapy: Secondary | ICD-10-CM | POA: Diagnosis not present

## 2018-01-22 DIAGNOSIS — M79605 Pain in left leg: Secondary | ICD-10-CM | POA: Diagnosis present

## 2018-01-22 LAB — COMPREHENSIVE METABOLIC PANEL
ALK PHOS: 117 U/L (ref 38–126)
ALT: 31 U/L (ref 14–54)
ANION GAP: 15 (ref 5–15)
AST: 48 U/L — ABNORMAL HIGH (ref 15–41)
Albumin: 3.8 g/dL (ref 3.5–5.0)
BILIRUBIN TOTAL: 0.5 mg/dL (ref 0.3–1.2)
BUN: 38 mg/dL — ABNORMAL HIGH (ref 6–20)
CALCIUM: 9.1 mg/dL (ref 8.9–10.3)
CO2: 24 mmol/L (ref 22–32)
CREATININE: 0.94 mg/dL (ref 0.44–1.00)
Chloride: 102 mmol/L (ref 101–111)
GFR calc Af Amer: >60 mL/min (ref >60)
GFR calc non Af Amer: 55 mL/min — ABNORMAL LOW (ref >60)
Glucose, Bld: 254 mg/dL — ABNORMAL HIGH (ref 65–99)
Potassium: 3.2 mmol/L — ABNORMAL LOW (ref 3.5–5.1)
SODIUM: 141 mmol/L (ref 135–145)
Total Protein: 6.4 g/dL — ABNORMAL LOW (ref 6.5–8.1)

## 2018-01-22 LAB — CBC WITH DIFFERENTIAL/PLATELET
BASOS ABS: 0 10*3/uL (ref 0–0.1)
BASOS PCT: 1 %
EOS ABS: 0.2 10*3/uL (ref 0–0.7)
Eosinophils Relative: 4 %
HEMATOCRIT: 42.8 % (ref 35.0–47.0)
HEMOGLOBIN: 14.7 g/dL (ref 12.0–16.0)
Lymphocytes Relative: 25 %
Lymphs Abs: 1.3 10*3/uL (ref 1.0–3.6)
MCH: 32.4 pg (ref 26.0–34.0)
MCHC: 34.4 g/dL (ref 32.0–36.0)
MCV: 94.2 fL (ref 80.0–100.0)
Monocytes Absolute: 0.4 10*3/uL (ref 0.2–0.9)
Monocytes Relative: 7 %
NEUTROS ABS: 3.3 10*3/uL (ref 1.4–6.5)
Neutrophils Relative %: 63 %
Platelets: 87 10*3/uL — ABNORMAL LOW (ref 150–440)
RBC: 4.55 MIL/uL (ref 3.80–5.20)
RDW: 15.2 % — ABNORMAL HIGH (ref 11.5–14.5)
WBC: 5.2 10*3/uL (ref 3.6–11.0)

## 2018-01-22 MED ORDER — CLINDAMYCIN HCL 150 MG PO CAPS
300.0000 mg | ORAL_CAPSULE | Freq: Once | ORAL | Status: AC
  Administered 2018-01-22: 300 mg via ORAL
  Filled 2018-01-22: qty 2

## 2018-01-22 MED ORDER — CLINDAMYCIN HCL 300 MG PO CAPS: 300.0000 mg | ORAL_CAPSULE | Freq: Three times a day (TID) | ORAL | 0 refills | Status: AC

## 2018-01-22 NOTE — Discharge Instructions (Signed)
Take the antibiotic as prescribed and finish the full course.  Keep the leg elevated when you are resting, and you also may use ice or cold pack to help decrease the swelling.  Return to the ER for new, worsening, persistent pain, spreading of the rash or pain up above the knee, weakness, difficulty walking or bearing weight, fevers, or any other new or worsening symptoms that concern you.

## 2018-01-22 NOTE — ED Notes (Signed)
Esign not working pt verbalized discharge instructions and has no questions at this time 

## 2018-01-22 NOTE — ED Triage Notes (Signed)
Pt arrives to ED with daughter. Pt has R leg bruising and redness, pretty much all R shin is bruised or red. Denies injury. Hx DM. Pt also has bruise to L knee but much smaller. Alert, oriented.

## 2018-01-22 NOTE — ED Provider Notes (Signed)
[facility] Emergency Department Provider Note ____________________________________________   First MD Initiated Contact with Patient 01/22/18 1734     (approximate)  I have reviewed the triage vital signs and the nursing notes.   HISTORY  Chief Complaint Leg Pain    HPI Sabrina Small is a 84 y.o. female with past medical history as noted below who presents with right anterior lower leg pain, acute onset 3 days ago, persistent course, and associated with bruising and redness to the anterior lower leg over the same time.  Patient is slightly unsure, but believes she did hit the anterior lower leg into something the day before the symptoms started.  She denies any other injuries.  She is not on any blood thinners except aspirin.  She denies swelling to the leg, pain above the knee, or numbness.  She has been able to walk and bear weight on this leg without difficulty.  Past Medical History:  Diagnosis Date  . Allergy   . Arthritis   . Bell's palsy   . Colon polyps   . Depression   . Diabetes mellitus without complication ([place])   . Diverticulitis   . Gout   . Hypothyroidism   . Sleep apnea     Patient Active Problem List   Diagnosis Date Noted  . Chronic ITP (idiopathic thrombocytopenia) (HCC) 03/22/2017  . Diabetes ([place]) 03/08/2017  . Gout 03/08/2017  . Hypothyroidism 03/08/2017  . Sepsis ([place]) 11/24/2016    Past Surgical History:  Procedure Laterality Date  . ABDOMINAL HYSTERECTOMY    . CHOLECYSTECTOMY    . COLONOSCOPY WITH PROPOFOL N/A 02/04/2017   Procedure: COLONOSCOPY WITH PROPOFOL;  Surgeon: Lollie Sails, MD;  Location: [facility] ENDOSCOPY;  Service: Endoscopy;  Laterality: N/A;  . EXCISION OF BREAST BIOPSY    . HERNIA REPAIR      Prior to Admission medications   Medication Sig Start Date End Date Taking? Authorizing Provider  buPROPion (WELLBUTRIN XL) 150 MG 24 hr tablet Take 150 mg by mouth daily.    [provider]    clindamycin (CLEOCIN) 300 MG capsule Take 1 capsule (300 mg total) by mouth 3 (three) times daily for 7 days. 01/23/18 01/30/18  Arta Silence, MD  DULoxetine (CYMBALTA) 60 MG capsule Take 60 mg by mouth daily.    [provider]  fenofibrate micronized (LOFIBRA) 200 MG capsule Take 200 mg by mouth daily.    [provider]  insulin aspart (NOVOLOG) 100 UNIT/ML injection Inject 2 Units into the skin 3 (three) times daily before meals. As needed if BG >200    [provider]  Insulin Detemir (LEVEMIR FLEXTOUCH) 100 UNIT/ML Pen Inject 42 Units into the skin at bedtime.     [provider]  levothyroxine (SYNTHROID, LEVOTHROID) 50 MCG tablet Take 50 mcg by mouth daily before breakfast.    [provider]  Lifitegrast (XIIDRA) 5 % SOLN Apply 1 drop to eye 2 (two) times daily.    [provider]  Multiple Vitamin (MULTIVITAMIN WITH MINERALS) TABS tablet Take 1 tablet by mouth daily.    [provider]  Omega-3 Fatty Acids (FISH OIL) 1000 MG CAPS Take 1,000 mg by mouth 4 (four) times daily.    [provider]  Pitavastatin Calcium (LIVALO) 2 MG TABS Take 2 mg by mouth every evening.     [provider]  pregabalin (LYRICA) 150 MG capsule Take 150 mg by mouth 2 (two) times daily.    [provider]  traMADol-acetaminophen (ULTRACET) 37.5-325 MG tablet Take 1 tablet by mouth 2 (two) times daily as needed for moderate pain. At 2 pm and 8 pm    [provider]  Vitamin D, Ergocalciferol, (DRISDOL) 50000 units CAPS capsule Take 50,000 Units by mouth every 30 (thirty) days.     [provider]    Allergies Doxycycline; Dust mite mixed allergen ext [mite (d. farinae)]; Glipizide; Iodinated diagnostic agents; Iodine; Liraglutide; Lortab [hydrocodone-acetaminophen]; Morphine and related; Omnicef [cefdinir]; Other; Penicillins; Percocet [oxycodone-acetaminophen]; Rosuvastatin; Sulfa antibiotics;  Trovafloxacin; Voltaren [diclofenac sodium]; and Zolpidem  History reviewed. No pertinent family history.  Social History Social History   Tobacco Use  . Smoking status: Former Smoker    Packs/day: 2.00    Years: 30.00    Pack years: 60.00    Last attempt to quit: 03/08/1985    Years since quitting: 32.8  . Smokeless tobacco: Never Used  Substance Use Topics  . Alcohol use: No  . Drug use: No    Review of Systems  Constitutional: No fever/chills. Eyes: No redness. ENT: No neck pain. Cardiovascular: Denies chest pain. Respiratory: Denies shortness of breath. Gastrointestinal: No nausea, no vomiting.  No diarrhea.  Genitourinary: Negative for flank pain.  Musculoskeletal: Positive for right lower leg pain. Skin: Positive for right anterior lower leg bruising. Neurological: Negative for focal weakness or numbness.   ____________________________________________   PHYSICAL EXAM:  VITAL SIGNS: ED Triage Vitals  Enc Vitals Group     BP 01/22/18 1441 (!) 172/70     Pulse Rate 01/22/18 1441 75     Resp 01/22/18 1441 16     Temp 01/22/18 1441 98.6 F (37 C)     Temp Source 01/22/18 1441 Oral     SpO2 01/22/18 1441 92 %     Weight 01/22/18 1440 155 lb (70.3 kg)     Height 01/22/18 1440 4\' 11"  (1.499 m)     Head Circumference --      Peak Flow --      Pain Score 01/22/18 1440 0     Pain Loc --      Pain Edu? --      Excl. in [place]? --     Constitutional: Alert and oriented. Well appearing and in no acute distress. Eyes: Conjunctivae are normal.  Head: Atraumatic. Nose: No congestion/rhinnorhea. Mouth/Throat: Mucous membranes are moist.   Neck: Normal range of motion.  Cardiovascular:   Good peripheral circulation. Respiratory: Normal respiratory effort. Gastrointestinal: No distention.  Musculoskeletal: Extremities warm and well perfused.  2+ DP pulses bilateral lower extremities.  Right lower extremity with area of ecchymosis to most of the anterior lower leg, with  approximately 2 cm healing superficial wound with scab.  No induration, warmth, or fluctuance.  No gross edema.  No popliteal or distal thigh tenderness or swelling. Neurologic:  Normal speech and language.  Motor and sensory intact in all extremities.  No gross focal neurologic deficits are appreciated.  Skin:  Skin is warm and dry. No rash noted. Psychiatric: Mood and affect are normal. Speech and behavior are normal.  ____________________________________________   LABS (all labs ordered are listed, but only abnormal results are displayed)  Labs Reviewed  COMPREHENSIVE METABOLIC PANEL - Abnormal; Notable for the following components:      Result Value   Potassium 3.2 (*)    Glucose, Bld 254 (*)    BUN 38 (*)    Total Protein 6.4 (*)    AST 48 (*)  GFR calc non Af Amer 55 (*)    All other components within normal limits  CBC WITH DIFFERENTIAL/PLATELET - Abnormal; Notable for the following components:   RDW 15.2 (*)    Platelets 87 (*)    All other components within normal limits   ____________________________________________  EKG   ____________________________________________  RADIOLOGY  XR RLE: No acute fracture [place] RLE: No DVT  ____________________________________________   PROCEDURES  Procedure(s) performed: No  Procedures  Critical Care performed: No ____________________________________________   INITIAL IMPRESSION / ASSESSMENT AND PLAN / ED COURSE  Pertinent labs & imaging results that were available during my care of the patient were reviewed by me and considered in my medical decision making (see chart for details).  84 year old female presents with right anterior lower leg pain and ecchymosis after a possible minor trauma several days ago.  She is able to bear weight on the leg and ambulate, and there are no neurologic symptoms.  Patient is on aspirin but not on other anticoagulation.  No other abnormal bruising or bleeding recently.  She has remote  history of DVT.  Past medical records reviewed in Epic and are noncontributory.  On exam, the patient is currently well-appearing, and the remainder of the exam is as described with bruising to the anterior lower leg but otherwise no significant edema, and the leg is neuro/vascular intact.  Differential includes uncomplicated ecchymosis or subcutaneous hematoma, versus less likely stress fracture or other bony injury, or DVT.  Given the lack of warmth, erythema, or induration I have a lower suspicion for cellulitis.  Plan: X-ray, DVT study, basic labs, and reassess.   ----------------------------------------- 9:29 PM on 01/22/2018 -----------------------------------------  X-ray DVT study are negative.  Based on discussion with patient and her daughter, given that the patient has had cellulitis in the past with a somewhat similar presentation, we will treat empirically for possible cellulitis.  Return precautions given, and the patient and her daughter expressed understanding.  ____________________________________________   FINAL CLINICAL IMPRESSION(S) / ED DIAGNOSES  Final diagnoses:  Cellulitis of right lower leg      NEW MEDICATIONS STARTED DURING THIS VISIT:  Discharge Medication List as of 01/22/2018  8:21 PM    START taking these medications   Details  clindamycin (CLEOCIN) 300 MG capsule Take 1 capsule (300 mg total) by mouth 3 (three) times daily for 7 days., Starting Mon 01/23/2018, Until Mon 01/30/2018, Print         Note:  This document was prepared using Dragon voice recognition software and may include unintentional dictation errors.    Arta Silence, MD 01/22/18 2129

## 2018-01-22 NOTE — ED Notes (Signed)
Pt up to toilet. Pt given crackers

## 2018-03-22 DIAGNOSIS — M1712 Unilateral primary osteoarthritis, left knee: Secondary | ICD-10-CM | POA: Insufficient documentation

## 2018-03-22 DIAGNOSIS — Z96659 Presence of unspecified artificial knee joint: Secondary | ICD-10-CM | POA: Insufficient documentation

## 2018-04-04 ENCOUNTER — Other Ambulatory Visit: Payer: Self-pay | Admitting: Orthopedic Surgery

## 2018-04-04 ENCOUNTER — Other Ambulatory Visit: Payer: Self-pay

## 2018-04-04 ENCOUNTER — Inpatient Hospital Stay: Payer: Medicare Other | Attending: Oncology

## 2018-04-04 DIAGNOSIS — D693 Immune thrombocytopenic purpura: Secondary | ICD-10-CM

## 2018-04-04 DIAGNOSIS — M25559 Pain in unspecified hip: Secondary | ICD-10-CM

## 2018-04-04 LAB — CBC WITH DIFFERENTIAL/PLATELET
BASOS ABS: 0.1 10*3/uL (ref 0–0.1)
Basophils Relative: 1 %
Eosinophils Absolute: 0.2 10*3/uL (ref 0–0.7)
Eosinophils Relative: 4 %
HEMATOCRIT: 42.7 % (ref 35.0–47.0)
HEMOGLOBIN: 14.7 g/dL (ref 12.0–16.0)
Lymphocytes Relative: 29 %
Lymphs Abs: 1.6 10*3/uL (ref 1.0–3.6)
MCH: 33.1 pg (ref 26.0–34.0)
MCHC: 34.5 g/dL (ref 32.0–36.0)
MCV: 96 fL (ref 80.0–100.0)
MONO ABS: 0.4 10*3/uL (ref 0.2–0.9)
Monocytes Relative: 7 %
NEUTROS ABS: 3.2 10*3/uL (ref 1.4–6.5)
NEUTROS PCT: 59 %
Platelets: 85 10*3/uL — ABNORMAL LOW (ref 150–440)
RBC: 4.45 MIL/uL (ref 3.80–5.20)
RDW: 15.1 % — AB (ref 11.5–14.5)
WBC: 5.5 10*3/uL (ref 3.6–11.0)

## 2018-04-11 ENCOUNTER — Encounter
Admission: RE | Admit: 2018-04-11 | Discharge: 2018-04-11 | Disposition: A | Discharge status: Home / Self Care | Payer: Medicare Other | Source: Ambulatory Visit | Attending: Orthopedic Surgery | Admitting: Orthopedic Surgery

## 2018-04-11 ENCOUNTER — Other Ambulatory Visit: Payer: Self-pay | Admitting: Orthopedic Surgery

## 2018-04-11 DIAGNOSIS — M25559 Pain in unspecified hip: Secondary | ICD-10-CM

## 2018-04-11 DIAGNOSIS — M419 Scoliosis, unspecified: Secondary | ICD-10-CM | POA: Diagnosis not present

## 2018-04-11 MED ORDER — TECHNETIUM TC 99M MEDRONATE IV KIT
20.0000 | PACK | Freq: Once | INTRAVENOUS | Status: AC | PRN
  Administered 2018-04-11: 22.94 via INTRAVENOUS

## 2018-05-24 ENCOUNTER — Other Ambulatory Visit: Payer: Self-pay | Admitting: Gastroenterology

## 2018-05-24 DIAGNOSIS — R1084 Generalized abdominal pain: Secondary | ICD-10-CM

## 2018-05-30 ENCOUNTER — Ambulatory Visit
Admission: RE | Admit: 2018-05-30 | Discharge: 2018-05-30 | Disposition: A | Discharge status: Home / Self Care | Payer: Medicare Other | Source: Ambulatory Visit | Attending: Gastroenterology | Admitting: Gastroenterology

## 2018-05-30 DIAGNOSIS — M419 Scoliosis, unspecified: Secondary | ICD-10-CM | POA: Insufficient documentation

## 2018-05-30 DIAGNOSIS — I2510 Atherosclerotic heart disease of native coronary artery without angina pectoris: Secondary | ICD-10-CM | POA: Insufficient documentation

## 2018-05-30 DIAGNOSIS — N200 Calculus of kidney: Secondary | ICD-10-CM | POA: Diagnosis not present

## 2018-05-30 DIAGNOSIS — R160 Hepatomegaly, not elsewhere classified: Secondary | ICD-10-CM | POA: Diagnosis not present

## 2018-05-30 DIAGNOSIS — K5730 Diverticulosis of large intestine without perforation or abscess without bleeding: Secondary | ICD-10-CM | POA: Insufficient documentation

## 2018-05-30 DIAGNOSIS — N3289 Other specified disorders of bladder: Secondary | ICD-10-CM | POA: Insufficient documentation

## 2018-05-30 DIAGNOSIS — K5900 Constipation, unspecified: Secondary | ICD-10-CM | POA: Insufficient documentation

## 2018-05-30 DIAGNOSIS — I700 Atherosclerosis of aorta: Secondary | ICD-10-CM | POA: Insufficient documentation

## 2018-05-30 DIAGNOSIS — Z9049 Acquired absence of other specified parts of digestive tract: Secondary | ICD-10-CM | POA: Insufficient documentation

## 2018-05-30 DIAGNOSIS — N281 Cyst of kidney, acquired: Secondary | ICD-10-CM | POA: Insufficient documentation

## 2018-05-30 DIAGNOSIS — R1084 Generalized abdominal pain: Secondary | ICD-10-CM | POA: Diagnosis not present

## 2018-05-30 DIAGNOSIS — Z90710 Acquired absence of both cervix and uterus: Secondary | ICD-10-CM | POA: Insufficient documentation

## 2018-05-30 DIAGNOSIS — J439 Emphysema, unspecified: Secondary | ICD-10-CM | POA: Insufficient documentation

## 2018-05-30 DIAGNOSIS — R911 Solitary pulmonary nodule: Secondary | ICD-10-CM | POA: Insufficient documentation

## 2018-07-13 ENCOUNTER — Encounter (INDEPENDENT_AMBULATORY_CARE_PROVIDER_SITE_OTHER): Payer: Self-pay | Admitting: Vascular Surgery

## 2018-07-13 ENCOUNTER — Ambulatory Visit (INDEPENDENT_AMBULATORY_CARE_PROVIDER_SITE_OTHER): Payer: Medicare Other | Admitting: Vascular Surgery

## 2018-07-13 VITALS — BP 144/69 | HR 83 | Resp 16 | Ht 59.0 in | Wt 172.8 lb

## 2018-07-13 DIAGNOSIS — M21969 Unspecified acquired deformity of unspecified lower leg: Secondary | ICD-10-CM

## 2018-07-13 DIAGNOSIS — I83013 Varicose veins of right lower extremity with ulcer of ankle: Secondary | ICD-10-CM

## 2018-07-13 DIAGNOSIS — I890 Lymphedema, not elsewhere classified: Secondary | ICD-10-CM | POA: Diagnosis not present

## 2018-07-13 DIAGNOSIS — E1169 Type 2 diabetes mellitus with other specified complication: Secondary | ICD-10-CM | POA: Diagnosis not present

## 2018-07-13 DIAGNOSIS — L97319 Non-pressure chronic ulcer of right ankle with unspecified severity: Secondary | ICD-10-CM | POA: Diagnosis not present

## 2018-07-13 DIAGNOSIS — I872 Venous insufficiency (chronic) (peripheral): Secondary | ICD-10-CM

## 2018-07-13 NOTE — Progress Notes (Signed)
MRN : [id number]  Sabrina Small is a 84 y.o. (July 19, 1933) female who presents with chief complaint of  Chief Complaint  Patient presents with  . New Patient (Initial Visit)    venous statis,recurrent wounds  .  History of Present Illness: Patient is seen for evaluation of leg pain and swelling associated with new onset of right ankle ulceration. The patient first noticed the swelling remotely. The swelling is associated with pain and discoloration. The pain and swelling worsens with prolonged dependency and improves with elevation. The pain is unrelated to activity.  The patient notes that in the morning the legs are better but the leg symptoms worsened throughout the course of the day. The patient has also noted a progressive worsening of the discoloration in the ankle and shin area.   The patient notes that an ulcer has developed acutely without specific trauma and since it occurred it has been very slow to heal.  There is a moderate amount of drainage associated with the open area.  The wound is also very painful.  The patient denies claudication symptoms or rest pain symptoms.  The patient denies DJD and LS spine disease.  The patient has not had any past angiography, interventions or vascular surgery.  Elevation makes the leg symptoms better, dependency makes them much worse. The patient denies any recent changes in medications.  The patient has not been wearing graduated compression.  The patient denies a history of DVT or PE. There is no prior history of phlebitis. There is no history of primary lymphedema.  No history of malignancies. No history of trauma or groin or pelvic surgery. There is no history of radiation treatment to the groin or pelvis       Current Meds  Medication Sig  . albuterol (PROAIR HFA) 108 (90 Base) MCG/ACT inhaler ProAir HFA 90 mcg/actuation aerosol inhaler  INHALE 2 PUFFS PO BID PRN FOR 7 DAYS  . allopurinol (ZYLOPRIM) 100 MG tablet allopurinol  100 mg tablet  Take 1 tablet every day by oral route.  Marland Kitchen aspirin EC 81 MG tablet Take 81 mg by mouth daily.  Marland Kitchen azithromycin (ZITHROMAX) 250 MG tablet azithromycin 250 mg tablet  . Biotin 1000 MCG tablet Take 1,000 mcg by mouth daily.  Marland Kitchen buPROPion (WELLBUTRIN XL) 150 MG 24 hr tablet Take 150 mg by mouth daily.  Marland Kitchen dicyclomine (BENTYL) 10 MG capsule Take 10 mg by mouth as needed for spasms.  Marland Kitchen docusate sodium (COLACE) 100 MG capsule Take 100 mg by mouth daily.  . DULoxetine (CYMBALTA) 60 MG capsule Take 60 mg by mouth daily.  Marland Kitchen FLUTICASONE PROPIONATE, INHAL, IN Inhale into the lungs.  . hydrOXYzine (ATARAX/VISTARIL) 25 MG tablet Take 25 mg by mouth 3 (three) times daily as needed.  . Insulin Detemir (LEVEMIR FLEXTOUCH) 100 UNIT/ML Pen Inject 42 Units into the skin at bedtime.   Marland Kitchen levothyroxine (SYNTHROID, LEVOTHROID) 50 MCG tablet Take 75 mcg by mouth daily before breakfast.   . Lifitegrast (XIIDRA) 5 % SOLN Apply 1 drop to eye 2 (two) times daily.  Marland Kitchen linaclotide (LINZESS) 72 MCG capsule Take by mouth.  . meloxicam (MOBIC) 15 MG tablet Take 15 mg by mouth daily.  . naproxen sodium (ALEVE) 220 MG tablet Take by mouth.  . Omega-3 Fatty Acids (FISH OIL) 1000 MG CAPS Take 1,000 mg by mouth 4 (four) times daily.  Marland Kitchen omeprazole (PRILOSEC) 20 MG capsule Take 20 mg by mouth daily.  . Pitavastatin Calcium (LIVALO) 2 MG TABS  Take 2 mg by mouth every evening.   . pregabalin (LYRICA) 150 MG capsule Take 150 mg by mouth 2 (two) times daily.  Marland Kitchen torsemide (DEMADEX) 100 MG tablet Take by mouth.  . traMADol-acetaminophen (ULTRACET) 37.5-325 MG tablet Take 1 tablet by mouth 2 (two) times daily as needed for moderate pain. At 2 pm and 8 pm  . vitamin B-12 (CYANOCOBALAMIN) 1000 MCG tablet Take 1,000 mcg by mouth daily.  . Vitamin D, Ergocalciferol, (DRISDOL) 50000 units CAPS capsule Take 50,000 Units by mouth every 30 (thirty) days.     Past Medical History:  Diagnosis Date  . Allergy   . Arthritis   . Bell's  palsy   . Colon polyps   . Depression   . Diabetes mellitus without complication ([place])   . Diverticulitis   . Gout   . Hypothyroidism   . Sleep apnea     Past Surgical History:  Procedure Laterality Date  . ABDOMINAL HYSTERECTOMY    . CHOLECYSTECTOMY    . COLONOSCOPY WITH PROPOFOL N/A 02/04/2017   Procedure: COLONOSCOPY WITH PROPOFOL;  Surgeon: Lollie Sails, MD;  Location: [facility] ENDOSCOPY;  Service: Endoscopy;  Laterality: N/A;  . EXCISION OF BREAST BIOPSY    . HERNIA REPAIR      Social History Social History   Tobacco Use  . Smoking status: Former Smoker    Packs/day: 2.00    Years: 30.00    Pack years: 60.00    Last attempt to quit: 03/08/1985    Years since quitting: 33.3  . Smokeless tobacco: Never Used  Substance Use Topics  . Alcohol use: No  . Drug use: No    Family History Family History  Adopted: Yes  No family history of bleeding/clotting disorders, porphyria or autoimmune disease   Allergies  Allergen Reactions  . Doxycycline     Patient cannot recall details of reaction  . Dust Mite Mixed Allergen Ext [Mite (D. Farinae)]   . Glipizide   . Iodinated Diagnostic Agents   . Iodine Other (See Comments)  . Liraglutide Other (See Comments)  . Lortab [Hydrocodone-Acetaminophen]   . Morphine And Related   . Omnicef [Cefdinir] Other (See Comments)    Patient cannot recall details of reaction  . Other     Cat dander  . Penicillins Other (See Comments)    Has patient had a PCN reaction causing immediate rash, facial/tongue/throat swelling, SOB or lightheadedness with hypotension: Unknown Has patient had a PCN reaction causing severe rash involving mucus membranes or skin necrosis: Unknown Has patient had a PCN reaction that required hospitalization: Unknown Has patient had a PCN reaction occurring within the last 10 years: No If all of the above answers are "NO", then may proceed with Cephalosporin use. "I blow up and pass out" Over 10 yrs ago.  Marland Kitchen  Percocet [Oxycodone-Acetaminophen] Other (See Comments)  . Rosuvastatin   . Sulfa Antibiotics Other (See Comments)    Patient cannot recall details of reaction  . Trovafloxacin     Patient cannot recall details of reaction  . Voltaren [Diclofenac Sodium] Other (See Comments)  . Zolpidem      REVIEW OF SYSTEMS (Negative unless checked)  Constitutional: [] Weight loss  [] Fever  [] Chills Cardiac: [] Chest pain   [] Chest pressure   [] Palpitations   [] Shortness of breath when laying flat   [] Shortness of breath with exertion. Vascular:  [] Pain in legs with walking   [x] Pain in legs at rest  [] History of DVT   [] Phlebitis   [  x]Swelling in legs   [] Varicose veins   [x] Non-healing ulcers Pulmonary:   [] Uses home oxygen   [] Productive cough   [] Hemoptysis   [] Wheeze  [] COPD   [] Asthma Neurologic:  [] Dizziness   [] Seizures   [] History of stroke   [] History of TIA  [] Aphasia   [] Vissual changes   [] Weakness or numbness in arm   [] Weakness or numbness in leg Musculoskeletal:   [] Joint swelling   [] Joint pain   [] Low back pain Hematologic:  [] Easy bruising  [] Easy bleeding   [] Hypercoagulable state   [] Anemic Gastrointestinal:  [] Diarrhea   [] Vomiting  [] Gastroesophageal reflux/heartburn   [] Difficulty swallowing. Genitourinary:  [] Chronic kidney disease   [] Difficult urination  [] Frequent urination   [] Blood in urine Skin:  [x] Rashes   [x] Ulcers  Psychological:  [] History of anxiety   []  History of major depression.  Physical Examination  Vitals:   07/13/18 0859  BP: (!) 144/69  Pulse: 83  Resp: 16  Weight: 172 lb 12.8 oz (78.4 kg)  Height: 4\' 11"  (1.499 m)   Body mass index is 34.9 kg/m. Gen: WD/WN, NAD Head: [place]/AT, No temporalis wasting.  Ear/Nose/Throat: Hearing grossly intact, nares w/o erythema or drainage, poor dentition Eyes: PER, EOMI, sclera nonicteric.  Neck: Supple, no masses.  No bruit or JVD.  Pulmonary:  Good air movement, clear to auscultation bilaterally, no use of  accessory muscles.  Cardiac: RRR, normal S1, S2, no Murmurs. Vascular: 2-3+ edema of the right leg with severe venous changes of the right leg.  Venous ulcer noted in the ankle area on the right, noninfected Vessel Right Left  Radial Palpable Palpable  PT Palpable Palpable  DP Palpable Palpable  Gastrointestinal: soft, non-distended. No guarding/no peritoneal signs.  Musculoskeletal: M/S 5/5 throughout.  No deformity or atrophy.  Neurologic: CN 2-12 intact. Pain and light touch intact in extremities.  Symmetrical.  Speech is fluent. Motor exam as listed above. Psychiatric: Judgment intact, Mood & affect appropriate for pt's clinical situation. Dermatologic: Venous stasis dermatitis with ulcers present on the right.  No changes consistent with cellulitis. Lymph : No Cervical lymphadenopathy, no lichenification or skin changes of chronic lymphedema.  CBC Lab Results  Component Value Date   WBC 5.5 04/04/2018   HGB 14.7 04/04/2018   HCT 42.7 04/04/2018   MCV 96.0 04/04/2018   PLT 85 (L) 04/04/2018    BMET    Component Value Date/Time   NA 141 01/22/2018 1443   K 3.2 (L) 01/22/2018 1443   CL 102 01/22/2018 1443   CO2 24 01/22/2018 1443   GLUCOSE 254 (H) 01/22/2018 1443   BUN 38 (H) 01/22/2018 1443   CREATININE 0.94 01/22/2018 1443   CALCIUM 9.1 01/22/2018 1443   GFRNONAA 55 (L) 01/22/2018 1443   GFRAA >60 01/22/2018 1443   CrCl cannot be calculated (Patient's most recent lab result is older than the maximum 21 days allowed.).  COAG Lab Results  Component Value Date   INR 1.23 02/04/2017   INR 1.20 11/24/2016    Radiology No results found.   Assessment/Plan 1. Venous ulcer of ankle, right (HCC) No surgery or intervention at this point in time.    I have had a long discussion with the patient regarding venous insufficiency and why it  causes symptoms, specifically venous ulceration . I have discussed with the patient the chronic skin changes that accompany venous  insufficiency and the long term sequela such as infection and recurring  ulceration.  Patient will be placed in [employer] which  will be changed weekly drainage permitting.  In addition, behavioral modification including several periods of elevation of the lower extremities during the day will be continued. Achieving a position with the ankles at heart level was stressed to the patient  The patient is instructed to begin routine exercise, especially walking on a daily basis  Patient should undergo duplex ultrasound of the venous system to ensure that DVT or reflux is not present.  Following the review of the ultrasound the patient will follow up in one week to reassess the degree of swelling and the control that [place] therapy is offering.   The patient can be assessed for graduated compression stockings or wraps as well as a Lymph Pump once the ulcers are healed.   2. Chronic venous insufficiency No surgery or intervention at this point in time.    I have had a long discussion with the patient regarding venous insufficiency and why it  causes symptoms. I have discussed with the patient the chronic skin changes that accompany venous insufficiency and the long term sequela such as infection and ulceration.  Patient will begin wearing graduated compression stockings class 1 (20-30 mmHg) or compression wraps on a daily basis a prescription was given. The patient will put the stockings on first thing in the morning and removing them in the evening. The patient is instructed specifically not to sleep in the stockings.    In addition, behavioral modification including several periods of elevation of the lower extremities during the day will be continued. I have demonstrated that proper elevation is a position with the ankles at heart level.  The patient is instructed to begin routine exercise, especially walking on a daily basis  Patient should undergo duplex ultrasound of the venous system to ensure  that DVT or reflux is not present.  Following the review of the ultrasound the patient will follow up in 2-3 months to reassess the degree of swelling and the control that graduated compression stockings or compression wraps  is offering.   The patient can be assessed for a Lymph Pump at that time  3. Lymphedema See #1&2  4. 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  Hgb A1C to be monitored as already arranged by primary service   Hortencia Pilar, MD  07/13/2018 9:47 AM

## 2018-07-16 ENCOUNTER — Encounter (INDEPENDENT_AMBULATORY_CARE_PROVIDER_SITE_OTHER): Payer: Self-pay | Admitting: Vascular Surgery

## 2018-07-16 DIAGNOSIS — I890 Lymphedema, not elsewhere classified: Secondary | ICD-10-CM | POA: Insufficient documentation

## 2018-07-16 DIAGNOSIS — L97319 Non-pressure chronic ulcer of right ankle with unspecified severity: Secondary | ICD-10-CM

## 2018-07-16 DIAGNOSIS — I872 Venous insufficiency (chronic) (peripheral): Secondary | ICD-10-CM | POA: Insufficient documentation

## 2018-07-16 DIAGNOSIS — I83013 Varicose veins of right lower extremity with ulcer of ankle: Secondary | ICD-10-CM | POA: Insufficient documentation

## 2018-08-10 ENCOUNTER — Encounter (INDEPENDENT_AMBULATORY_CARE_PROVIDER_SITE_OTHER): Payer: Self-pay | Admitting: Vascular Surgery

## 2018-08-10 ENCOUNTER — Other Ambulatory Visit: Payer: Self-pay

## 2018-08-10 ENCOUNTER — Ambulatory Visit (INDEPENDENT_AMBULATORY_CARE_PROVIDER_SITE_OTHER): Payer: Medicare Other | Admitting: Vascular Surgery

## 2018-08-10 VITALS — BP 154/79 | HR 83 | Resp 13 | Ht 60.0 in | Wt 175.0 lb

## 2018-08-10 DIAGNOSIS — E039 Hypothyroidism, unspecified: Secondary | ICD-10-CM | POA: Diagnosis not present

## 2018-08-10 DIAGNOSIS — I890 Lymphedema, not elsewhere classified: Secondary | ICD-10-CM

## 2018-08-10 DIAGNOSIS — I872 Venous insufficiency (chronic) (peripheral): Secondary | ICD-10-CM | POA: Diagnosis not present

## 2018-08-10 DIAGNOSIS — E1169 Type 2 diabetes mellitus with other specified complication: Secondary | ICD-10-CM

## 2018-08-10 DIAGNOSIS — M21969 Unspecified acquired deformity of unspecified lower leg: Secondary | ICD-10-CM

## 2018-08-10 DIAGNOSIS — Z1231 Encounter for screening mammogram for malignant neoplasm of breast: Secondary | ICD-10-CM

## 2018-08-11 ENCOUNTER — Other Ambulatory Visit: Payer: Self-pay | Admitting: Physician Assistant

## 2018-08-11 ENCOUNTER — Encounter (INDEPENDENT_AMBULATORY_CARE_PROVIDER_SITE_OTHER): Payer: Self-pay | Admitting: Vascular Surgery

## 2018-08-11 DIAGNOSIS — Z1231 Encounter for screening mammogram for malignant neoplasm of breast: Secondary | ICD-10-CM

## 2018-08-11 NOTE — Progress Notes (Signed)
MRN : [id number]  Sabrina Small is a 84 y.o. (21-Aug-1933) female who presents with chief complaint of  Chief Complaint  Patient presents with  . Follow-up    Unna boot check  .  History of Present Illness:   Patient is seen for follow up evaluation of leg pain and swelling associated with venous ulceration. The patient was treated with Unna boot therapy.  The patient has also noted a progressive worsening of the discoloration in the ankle and shin area.   The right ankle ulcer has now healed.  The patient states that they have been elevating as much as possible. The patient denies any recent changes in medications.  The patient denies a history of DVT or PE. There is no prior history of phlebitis. There is no history of primary lymphedema.  No SOB or increased cough.  No sputum production.  No recent episodes of CHF exacerbation.   Current Meds  Medication Sig  . albuterol (PROAIR HFA) 108 (90 Base) MCG/ACT inhaler ProAir HFA 90 mcg/actuation aerosol inhaler  INHALE 2 PUFFS PO BID PRN FOR 7 DAYS  . allopurinol (ZYLOPRIM) 100 MG tablet allopurinol 100 mg tablet  Take 1 tablet every day by oral route.  Marland Kitchen aspirin EC 81 MG tablet Take 81 mg by mouth daily.  . Biotin 1000 MCG tablet Take 1,000 mcg by mouth daily.  Marland Kitchen buPROPion (WELLBUTRIN XL) 150 MG 24 hr tablet Take 150 mg by mouth daily.  . Cholecalciferol (VITAMIN D3) 1000 units CAPS Take by mouth.  . Dextran 70-Hypromellose (ARTIFICIAL TEARS) 0.1-0.3 % SOLN Apply to eye.  . dicyclomine (BENTYL) 10 MG capsule Take 10 mg by mouth as needed for spasms.  . diphenhydrAMINE (BENADRYL) 25 mg capsule Take by mouth.  . docusate sodium (COLACE) 100 MG capsule Take 100 mg by mouth daily.  . DULoxetine (CYMBALTA) 60 MG capsule Take 60 mg by mouth daily.  . fenofibrate micronized (LOFIBRA) 200 MG capsule Take 200 mg by mouth daily.  Marland Kitchen FLUTICASONE PROPIONATE, INHAL, IN Inhale into the lungs.  . Fluticasone-Salmeterol (ADVAIR) 100-50  MCG/DOSE AEPB Inhale into the lungs.  . hydrOXYzine (ATARAX/VISTARIL) 25 MG tablet Take 25 mg by mouth 3 (three) times daily as needed.  . insulin aspart (NOVOLOG) 100 UNIT/ML injection Inject 2 Units into the skin 3 (three) times daily before meals. As needed if BG >200  . Insulin Detemir (LEVEMIR FLEXTOUCH) 100 UNIT/ML Pen Inject 42 Units into the skin at bedtime.   Marland Kitchen levothyroxine (SYNTHROID, LEVOTHROID) 50 MCG tablet Take 75 mcg by mouth daily before breakfast.   . Lidocaine HCl 4 % CREA Apply topically.  Marland Kitchen Lifitegrast (XIIDRA) 5 % SOLN Apply 1 drop to eye 2 (two) times daily.  Marland Kitchen linaclotide (LINZESS) 72 MCG capsule Take by mouth.  . meloxicam (MOBIC) 15 MG tablet Take 15 mg by mouth daily.  . Multiple Vitamin (MULTIVITAMIN WITH MINERALS) TABS tablet Take 1 tablet by mouth daily.  . naproxen sodium (ALEVE) 220 MG tablet Take by mouth.  . Omega-3 Fatty Acids (FISH OIL) 1000 MG CAPS Take 1,000 mg by mouth 4 (four) times daily.  Marland Kitchen omeprazole (PRILOSEC) 20 MG capsule Take 20 mg by mouth daily.  . Pitavastatin Calcium (LIVALO) 2 MG TABS Take 2 mg by mouth every evening.   . pregabalin (LYRICA) 150 MG capsule Take 150 mg by mouth 2 (two) times daily.  Marland Kitchen torsemide (DEMADEX) 100 MG tablet Take by mouth.  . traMADol-acetaminophen (ULTRACET) 37.5-325 MG tablet Take 1  tablet by mouth 2 (two) times daily as needed for moderate pain. At 2 pm and 8 pm  . vitamin B-12 (CYANOCOBALAMIN) 1000 MCG tablet Take 1,000 mcg by mouth daily.  . Vitamin D, Ergocalciferol, (DRISDOL) 50000 units CAPS capsule Take 50,000 Units by mouth every 30 (thirty) days.   . Vitamins/Minerals TABS Take by mouth.    Past Medical History:  Diagnosis Date  . Allergy   . Arthritis   . Bell's palsy   . Colon polyps   . Depression   . Diabetes mellitus without complication ([place])   . Diverticulitis   . Gout   . Hypothyroidism   . Sleep apnea     Past Surgical History:  Procedure Laterality Date  . ABDOMINAL HYSTERECTOMY      . CHOLECYSTECTOMY    . COLONOSCOPY WITH PROPOFOL N/A 02/04/2017   Procedure: COLONOSCOPY WITH PROPOFOL;  Surgeon: Lollie Sails, MD;  Location[facility] ENDOSCOPY;  Service: Endoscopy;  Laterality: N/A;  . EXCISION OF BREAST BIOPSY    . HERNIA REPAIR      Social History Social History   Tobacco Use  . Smoking status: Former Smoker    Packs/day: 2.00    Years: 30.00    Pack years: 60.00    Last attempt to quit: 03/08/1985    Years since quitting: 33.4  . Smokeless tobacco: Never Used  Substance Use Topics  . Alcohol use: No  . Drug use: No    Family History Family History  Adopted: Yes    Allergies  Allergen Reactions  . Doxycycline     Patient cannot recall details of reaction  . Dust Mite Mixed Allergen Ext [Mite (D. Farinae)]   . Glipizide   . Iodinated Diagnostic Agents   . Iodine Other (See Comments)  . Liraglutide Other (See Comments)  . Lortab [Hydrocodone-Acetaminophen]   . Morphine And Related   . Omnicef [Cefdinir] Other (See Comments)    Patient cannot recall details of reaction  . Other     Cat dander  . Penicillins Other (See Comments)    Has patient had a PCN reaction causing immediate rash, facial/tongue/throat swelling, SOB or lightheadedness with hypotension: Unknown Has patient had a PCN reaction causing severe rash involving mucus membranes or skin necrosis: Unknown Has patient had a PCN reaction that required hospitalization: Unknown Has patient had a PCN reaction occurring within the last 10 years: No If all of the above answers are "NO", then may proceed with Cephalosporin use. "I blow up and pass out" Over 10 yrs ago.  Marland Kitchen Percocet [Oxycodone-Acetaminophen] Other (See Comments)  . Rosuvastatin   . Sulfa Antibiotics Other (See Comments)    Patient cannot recall details of reaction  . Sulfamethoxazole-Trimethoprim     Other reaction(s): Unknown Other reaction(s): Unknown   . Sulfasalazine     Other reaction(s): Other (See Comments) Patient  cannot recall details of reaction  . Trimethoprim   . Trovafloxacin     Patient cannot recall details of reaction  . Voltaren [Diclofenac Sodium] Other (See Comments)  . Zolpidem      REVIEW OF SYSTEMS (Negative unless checked)  Constitutional: [] Weight loss  [] Fever  [] Chills Cardiac: [] Chest pain   [] Chest pressure   [] Palpitations   [] Shortness of breath when laying flat   [] Shortness of breath with exertion. Vascular:  [] Pain in legs with walking   [] Pain in legs at rest  [] History of DVT   [] Phlebitis   [x] Swelling in legs   [x] Varicose veins   []   Non-healing ulcers Pulmonary:   [] Uses home oxygen   [] Productive cough   [] Hemoptysis   [] Wheeze  [] COPD   [] Asthma Neurologic:  [] Dizziness   [] Seizures   [] History of stroke   [] History of TIA  [] Aphasia   [] Vissual changes   [] Weakness or numbness in arm   [] Weakness or numbness in leg Musculoskeletal:   [] Joint swelling   [] Joint pain   [] Low back pain Hematologic:  [] Easy bruising  [] Easy bleeding   [] Hypercoagulable state   [] Anemic Gastrointestinal:  [] Diarrhea   [] Vomiting  [] Gastroesophageal reflux/heartburn   [] Difficulty swallowing. Genitourinary:  [] Chronic kidney disease   [] Difficult urination  [] Frequent urination   [] Blood in urine Skin:  [x] Rashes   [] Ulcers  Psychological:  [] History of anxiety   []  History of major depression.  Physical Examination  Vitals:   08/10/18 0857  BP: (!) 154/79  Pulse: 83  Resp: 13  Weight: 175 lb (79.4 kg)  Height: 5' (1.524 m)   Body mass index is 34.18 kg/m. Gen: WD/WN, NAD Head: [place]/AT, No temporalis wasting.  Ear/Nose/Throat: Hearing grossly intact, nares w/o erythema or drainage Eyes: PER, EOMI, sclera nonicteric.  Neck: Supple, no large masses.   Pulmonary:  Good air movement, no audible wheezing bilaterally, no use of accessory muscles.  Cardiac: RRR, no JVD Vascular: scattered varicosities present bilaterally.  Mild venous stasis changes to the legs bilaterally.  2+ soft  pitting edema Vessel Right Left  Radial Palpable Palpable  PT Palpable Palpable  DP Palpable Palpable  Gastrointestinal: Non-distended. No guarding/no peritoneal signs.  Musculoskeletal: M/S 5/5 throughout.  No deformity or atrophy.  Neurologic: CN 2-12 intact. Symmetrical.  Speech is fluent. Motor exam as listed above. Psychiatric: Judgment intact, Mood & affect appropriate for pt's clinical situation. Dermatologic: Venous rashes no ulcers noted.  No changes consistent with cellulitis. Lymph : No lichenification or skin changes of chronic lymphedema.  CBC Lab Results  Component Value Date   WBC 5.5 04/04/2018   HGB 14.7 04/04/2018   HCT 42.7 04/04/2018   MCV 96.0 04/04/2018   PLT 85 (L) 04/04/2018    BMET    Component Value Date/Time   NA 141 01/22/2018 1443   K 3.2 (L) 01/22/2018 1443   CL 102 01/22/2018 1443   CO2 24 01/22/2018 1443   GLUCOSE 254 (H) 01/22/2018 1443   BUN 38 (H) 01/22/2018 1443   CREATININE 0.94 01/22/2018 1443   CALCIUM 9.1 01/22/2018 1443   GFRNONAA 55 (L) 01/22/2018 1443   GFRAA >60 01/22/2018 1443   CrCl cannot be calculated (Patient's most recent lab result is older than the maximum 21 days allowed.).  COAG Lab Results  Component Value Date   INR 1.23 02/04/2017   INR 1.20 11/24/2016    Radiology No results found.   Assessment/Plan 1. Chronic venous insufficiency No surgery or intervention at this point in time.    I have had a long discussion with the patient regarding venous insufficiency and why it  causes symptoms. I have discussed with the patient the chronic skin changes that accompany venous insufficiency and the long term sequela such as infection and ulceration.  Patient will begin wearing graduated compression stockings class 1 (20-30 mmHg) or compression wraps on a daily basis a prescription was given. The patient will put the stockings on first thing in the morning and removing them in the evening. The patient is instructed  specifically not to sleep in the stockings.    In addition, behavioral modification including several periods  of elevation of the lower extremities during the day will be continued. I have demonstrated that proper elevation is a position with the ankles at heart level.  The patient is instructed to begin routine exercise, especially walking on a daily basis  The patient will follow up in 12 months to reassess the degree of swelling and the control that graduated compression stockings or compression wraps  is offering.   The patient can be assessed for a Lymph Pump at that time  2. Lymphedema  No surgery or intervention at this point in time.    I have reviewed my previous discussion with the patient regarding swelling and why it  causes symptoms.  The patient is doing well with compression and will continue wearing graduated compression stockings class 1 (20-30 mmHg) on a daily basis a prescription was given. The patient will  continue wearing the stockings first thing in the morning and removing them in the evening. The patient is instructed specifically not to sleep in the stockings.    In addition, behavioral modification including elevation during the day and exercise will be continued.    Patient should follow-up on an annual basis   3. 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  Hgb A1C to be monitored as already arranged by primary service   4. Hypothyroidism, unspecified type Continue Synthroid as already ordered, these medications have been reviewed and there are no changes at this time.     Hortencia Pilar, MD  08/11/2018 9:47 AM

## 2018-08-24 ENCOUNTER — Ambulatory Visit (INDEPENDENT_AMBULATORY_CARE_PROVIDER_SITE_OTHER): Payer: Medicare Other

## 2018-08-24 ENCOUNTER — Other Ambulatory Visit (INDEPENDENT_AMBULATORY_CARE_PROVIDER_SITE_OTHER): Payer: Self-pay | Admitting: Vascular Surgery

## 2018-08-24 ENCOUNTER — Ambulatory Visit (INDEPENDENT_AMBULATORY_CARE_PROVIDER_SITE_OTHER): Payer: Medicare Other | Admitting: Nurse Practitioner

## 2018-08-24 ENCOUNTER — Encounter (INDEPENDENT_AMBULATORY_CARE_PROVIDER_SITE_OTHER): Payer: Self-pay | Admitting: Nurse Practitioner

## 2018-08-24 VITALS — BP 181/81 | HR 73 | Resp 15 | Ht 60.0 in | Wt 174.0 lb

## 2018-08-24 DIAGNOSIS — E1169 Type 2 diabetes mellitus with other specified complication: Secondary | ICD-10-CM | POA: Diagnosis not present

## 2018-08-24 DIAGNOSIS — I890 Lymphedema, not elsewhere classified: Secondary | ICD-10-CM | POA: Diagnosis not present

## 2018-08-24 DIAGNOSIS — R600 Localized edema: Secondary | ICD-10-CM | POA: Diagnosis not present

## 2018-08-24 DIAGNOSIS — M7989 Other specified soft tissue disorders: Secondary | ICD-10-CM

## 2018-08-24 DIAGNOSIS — E785 Hyperlipidemia, unspecified: Secondary | ICD-10-CM | POA: Diagnosis not present

## 2018-08-24 DIAGNOSIS — M21969 Unspecified acquired deformity of unspecified lower leg: Secondary | ICD-10-CM

## 2018-08-24 NOTE — Progress Notes (Signed)
Subjective:    Patient ID: Sabrina Small, female    DOB: March 15, 1933, 84 y.o.   MRN: [id number] Chief Complaint  Patient presents with  . Follow-up    ultrasound follow up    HPI  Sabrina Small is a 84 y.o. female that returns to the office for followup evaluation regarding leg swelling.  The swelling has improved quite a bit and the pain associated with swelling has decreased substantially. There have not been any interval development of a ulcerations or wounds since the patient has been out of Unna boots.  Since the previous visit the patient has been wearing graduated compression stockings and has noted little significant improvement in the lymphedema. The patient has been using compression routinely morning until night.  The patient also states elevation during the day and exercise is being done too.  The patient underwent a venous reflux study today, which revealed reflux in the deep venous system bilaterally.  Reflux was found in the small saphenous vein on the right lower extremity, with reflux in the great saphenous vein in the left lower extremity there is no evidence of DVT bilaterally.   Review of Systems: Negative Unless Checked Constitutional: [] Weight loss  [] Fever  [] Chills Cardiac: [] Chest pain   [] Chest pressure   [] Palpitations   [] Shortness of breath when laying flat   [] Shortness of breath with exertion. Vascular:  [] Pain in legs with walking   [] Pain in legs with standing  [] History of DVT   [] Phlebitis   [x] Swelling in legs   [x] Varicose veins   [] Non-healing ulcers Pulmonary:   [] Uses home oxygen   [] Productive cough   [] Hemoptysis   [] Wheeze  [] COPD   [] Asthma Neurologic:  [] Dizziness   [] Seizures   [] History of stroke   [] History of TIA  [] Aphasia   [] Vissual changes   [] Weakness or numbness in arm   [] Weakness or numbness in leg Musculoskeletal:   [] Joint swelling   [] Joint pain   [] Low back pain Hematologic:  [x] Easy bruising  [] Easy bleeding    [] Hypercoagulable state   [] Anemic Gastrointestinal:  [] Diarrhea   [] Vomiting  [] Gastroesophageal reflux/heartburn   [] Difficulty swallowing. Genitourinary:  [] Chronic kidney disease   [] Difficult urination  [] Frequent urination   [] Blood in urine Skin:  [] Rashes   [] Ulcers  Psychological:  [] History of anxiety   []  History of major depression.     Objective:   Physical Exam  BP (!) 181/81 (BP Location: Right Arm)   Pulse 73   Resp 15   Ht 5' (1.524 m)   Wt 174 lb (78.9 kg)   BMI 33.98 kg/m   Past Medical History:  Diagnosis Date  . Allergy   . Arthritis   . Bell's palsy   . Colon polyps   . Depression   . Diabetes mellitus without complication ([place])   . Diverticulitis   . Gout   . Hypothyroidism   . Sleep apnea      Gen: WD/WN, NAD Head: [place]/AT, No temporalis wasting.  Ear/Nose/Throat: Hearing grossly intact, nares w/o erythema or drainage Eyes: PER, EOMI, sclera nonicteric.  Neck: Supple, no masses.  No JVD.  Pulmonary:  Good air movement, no use of accessory muscles.  Cardiac: RRR Vascular: 2+ edema of the right leg with venous stasis dermatitis bilaterally.  Previous venous ulcer healed. Vessel Right Left  Radial 2+ 2+  PT 2+ 2+  DP 2+ 2+  Gastrointestinal: soft, non-distended. No guarding/no peritoneal signs.  Musculoskeletal: M/S 5/5 throughout.  No  deformity or atrophy.  Neurologic: Pain and light touch intact in extremities.  Symmetrical.  Speech is fluent. Motor exam as listed above. Psychiatric: Judgment intact, Mood & affect appropriate for pt's clinical situation. Dermatologic: severe stasis dermatitis bilaterally. No Ulcers Noted.  No changes consistent with cellulitis. Lymph : No Cervical lymphadenopathy, dermal thickening present bilaterally lower extremities.   Social History   Socioeconomic History  . Marital status: Widowed    Spouse name: Not on file  . Number of children: Not on file  . Years of education: Not on file  . Highest education  level: Not on file  Occupational History  . Not on file  Social Needs  . Financial resource strain: Not on file  . Food insecurity:    Worry: Not on file    Inability: Not on file  . Transportation needs:    Medical: Not on file    Non-medical: Not on file  Tobacco Use  . Smoking status: Former Smoker    Packs/day: 2.00    Years: 30.00    Pack years: 60.00    Last attempt to quit: 03/08/1985    Years since quitting: 33.4  . Smokeless tobacco: Never Used  Substance and Sexual Activity  . Alcohol use: No  . Drug use: No  . Sexual activity: Not on file  Lifestyle  . Physical activity:    Days per week: Not on file    Minutes per session: Not on file  . Stress: Not on file  Relationships  . Social connections:    Talks on phone: Not on file    Gets together: Not on file    Attends religious service: Not on file    Active member of club or organization: Not on file    Attends meetings of clubs or organizations: Not on file    Relationship status: Not on file  . Intimate partner violence:    Fear of current or ex partner: Not on file    Emotionally abused: Not on file    Physically abused: Not on file    Forced sexual activity: Not on file  Other Topics Concern  . Not on file  Social History Narrative  . Not on file    Past Surgical History:  Procedure Laterality Date  . ABDOMINAL HYSTERECTOMY    . CHOLECYSTECTOMY    . COLONOSCOPY WITH PROPOFOL N/A 02/04/2017   Procedure: COLONOSCOPY WITH PROPOFOL;  Surgeon: Lollie Sails, MD;  Location: [facility] ENDOSCOPY;  Service: Endoscopy;  Laterality: N/A;  . EXCISION OF BREAST BIOPSY    . HERNIA REPAIR      Family History  Adopted: Yes    Allergies  Allergen Reactions  . Doxycycline     Patient cannot recall details of reaction  . Dust Mite Mixed Allergen Ext [Mite (D. Farinae)]   . Glipizide   . Iodinated Diagnostic Agents   . Iodine Other (See Comments)  . Liraglutide Other (See Comments)  . Lortab  [Hydrocodone-Acetaminophen]   . Morphine And Related   . Omnicef [Cefdinir] Other (See Comments)    Patient cannot recall details of reaction  . Other     Cat dander  . Penicillins Other (See Comments)    Has patient had a PCN reaction causing immediate rash, facial/tongue/throat swelling, SOB or lightheadedness with hypotension: Unknown Has patient had a PCN reaction causing severe rash involving mucus membranes or skin necrosis: Unknown Has patient had a PCN reaction that required hospitalization: Unknown Has patient had a  PCN reaction occurring within the last 10 years: No If all of the above answers are "NO", then may proceed with Cephalosporin use. "I blow up and pass out" Over 10 yrs ago.  Marland Kitchen Percocet [Oxycodone-Acetaminophen] Other (See Comments)  . Rosuvastatin   . Sulfa Antibiotics Other (See Comments)    Patient cannot recall details of reaction  . Sulfamethoxazole-Trimethoprim     Other reaction(s): Unknown Other reaction(s): Unknown   . Sulfasalazine     Other reaction(s): Other (See Comments) Patient cannot recall details of reaction  . Trimethoprim   . Trovafloxacin     Patient cannot recall details of reaction  . Voltaren [Diclofenac Sodium] Other (See Comments)  . Zolpidem        Assessment & Plan:   1. Lymphedema Recommend:  No surgery or intervention at this point in time.    I have reviewed my previous discussion with the patient regarding swelling and why it causes symptoms.  Patient will continue wearing graduated compression stockings class 1 (20-30 mmHg) on a daily basis. The patient will  beginning wearing the stockings first thing in the morning and removing them in the evening. The patient is instructed specifically not to sleep in the stockings.    In addition, behavioral modification including several periods of elevation of the lower extremities during the day will be continued.  This was reviewed with the patient during the initial visit.  The  patient will also continue routine exercise, especially walking on a daily basis as was discussed during the initial visit.    Despite conservative treatments including graduated compression therapy class 1 and behavioral modification including exercise and elevation the patient  has not obtained adequate control of the lymphedema.  The patient still has stage 3 lymphedema and therefore, I believe that a lymph pump should be added to improve the control of the patient's lymphedema.  Additionally, a lymph pump is warranted because it will reduce the risk of cellulitis and ulceration in the future.  Patient should follow-up in six months    2. 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  Hgb A1C to be monitored as already arranged by primary service Continue hypoglycemic medications as already ordered, these medications have been reviewed and there are no changes at this time.  Hgb A1C to be monitored as already arranged by primary service   3. Hyperlipidemia due to type 2 diabetes mellitus ([place]) Continue statin as ordered and reviewed, no changes at this time    Current Outpatient Medications on File Prior to Visit  Medication Sig Dispense Refill  . albuterol (PROAIR HFA) 108 (90 Base) MCG/ACT inhaler ProAir HFA 90 mcg/actuation aerosol inhaler  INHALE 2 PUFFS PO BID PRN FOR 7 DAYS    . allopurinol (ZYLOPRIM) 100 MG tablet allopurinol 100 mg tablet  Take 1 tablet every day by oral route.    Marland Kitchen aspirin EC 81 MG tablet Take 81 mg by mouth daily.    . Biotin 1000 MCG tablet Take 1,000 mcg by mouth daily.    Marland Kitchen buPROPion (WELLBUTRIN XL) 150 MG 24 hr tablet Take 150 mg by mouth daily.    . Cholecalciferol (VITAMIN D3) 1000 units CAPS Take by mouth.    . Dextran 70-Hypromellose (ARTIFICIAL TEARS) 0.1-0.3 % SOLN Apply to eye.    . dicyclomine (BENTYL)  10 MG capsule Take 10 mg by mouth as  needed for spasms.    . diphenhydrAMINE (BENADRYL) 25 mg capsule Take by mouth.    . docusate sodium (COLACE) 100 MG capsule Take 100 mg by mouth daily.    . DULoxetine (CYMBALTA) 60 MG capsule Take 60 mg by mouth daily.    . fenofibrate micronized (LOFIBRA) 200 MG capsule Take 200 mg by mouth daily.    Marland Kitchen FLUTICASONE PROPIONATE, INHAL, IN Inhale into the lungs.    . Fluticasone-Salmeterol (ADVAIR) 100-50 MCG/DOSE AEPB Inhale into the lungs.    . hydrOXYzine (ATARAX/VISTARIL) 25 MG tablet Take 25 mg by mouth 3 (three) times daily as needed.    . insulin aspart (NOVOLOG) 100 UNIT/ML injection Inject 2 Units into the skin 3 (three) times daily before meals. As needed if BG >200    . Insulin Detemir (LEVEMIR FLEXTOUCH) 100 UNIT/ML Pen Inject 42 Units into the skin at bedtime.     Marland Kitchen levothyroxine (SYNTHROID, LEVOTHROID) 50 MCG tablet Take 75 mcg by mouth daily before breakfast.     . Lidocaine HCl 4 % CREA Apply topically.    Marland Kitchen Lifitegrast (XIIDRA) 5 % SOLN Apply 1 drop to eye 2 (two) times daily.    Marland Kitchen linaclotide (LINZESS) 72 MCG capsule Take by mouth.    . meloxicam (MOBIC) 15 MG tablet Take 15 mg by mouth daily.    . Multiple Vitamin (MULTIVITAMIN WITH MINERALS) TABS tablet Take 1 tablet by mouth daily.    . naproxen sodium (ALEVE) 220 MG tablet Take by mouth.    . Omega-3 Fatty Acids (FISH OIL) 1000 MG CAPS Take 1,000 mg by mouth 4 (four) times daily.    Marland Kitchen omeprazole (PRILOSEC) 20 MG capsule Take 20 mg by mouth daily.    . Pitavastatin Calcium (LIVALO) 2 MG TABS Take 2 mg by mouth every evening.     . pregabalin (LYRICA) 150 MG capsule Take 150 mg by mouth 2 (two) times daily.    Marland Kitchen torsemide (DEMADEX) 100 MG tablet Take by mouth.    . traMADol-acetaminophen (ULTRACET) 37.5-325 MG tablet Take 1 tablet by mouth 2 (two) times daily as needed for moderate pain. At 2 pm and 8 pm    . vitamin B-12 (CYANOCOBALAMIN) 1000 MCG tablet Take 1,000 mcg by mouth daily.      . Vitamin D, Ergocalciferol, (DRISDOL) 50000 units CAPS capsule Take 50,000 Units by mouth every 30 (thirty) days.     . Vitamins/Minerals TABS Take by mouth.     No current facility-administered medications on file prior to visit.     There are no Patient Instructions on file for this visit. No follow-ups on file.   Kris Hartmann, NP

## 2018-09-01 ENCOUNTER — Inpatient Hospital Stay (HOSPITAL_COMMUNITY)
Admission: EM | Admit: 2018-09-01 | Discharge: 2018-09-05 | DRG: 917 | Disposition: A | Discharge status: Home / Self Care | Payer: Medicare Other | Attending: Internal Medicine | Admitting: Internal Medicine

## 2018-09-01 ENCOUNTER — Encounter (HOSPITAL_COMMUNITY): Payer: Self-pay

## 2018-09-01 ENCOUNTER — Emergency Department (HOSPITAL_COMMUNITY): Payer: Medicare Other

## 2018-09-01 ENCOUNTER — Other Ambulatory Visit: Payer: Self-pay

## 2018-09-01 DIAGNOSIS — E785 Hyperlipidemia, unspecified: Secondary | ICD-10-CM | POA: Diagnosis present

## 2018-09-01 DIAGNOSIS — T383X1A Poisoning by insulin and oral hypoglycemic [antidiabetic] drugs, accidental (unintentional), initial encounter: Secondary | ICD-10-CM | POA: Diagnosis not present

## 2018-09-01 DIAGNOSIS — Z880 Allergy status to penicillin: Secondary | ICD-10-CM

## 2018-09-01 DIAGNOSIS — E162 Hypoglycemia, unspecified: Secondary | ICD-10-CM | POA: Diagnosis present

## 2018-09-01 DIAGNOSIS — R140 Abdominal distension (gaseous): Secondary | ICD-10-CM

## 2018-09-01 DIAGNOSIS — Z888 Allergy status to other drugs, medicaments and biological substances status: Secondary | ICD-10-CM

## 2018-09-01 DIAGNOSIS — I110 Hypertensive heart disease with heart failure: Secondary | ICD-10-CM | POA: Diagnosis present

## 2018-09-01 DIAGNOSIS — Z885 Allergy status to narcotic agent status: Secondary | ICD-10-CM

## 2018-09-01 DIAGNOSIS — E11649 Type 2 diabetes mellitus with hypoglycemia without coma: Secondary | ICD-10-CM | POA: Diagnosis present

## 2018-09-01 DIAGNOSIS — R32 Unspecified urinary incontinence: Secondary | ICD-10-CM | POA: Diagnosis present

## 2018-09-01 DIAGNOSIS — Z86010 Personal history of colonic polyps: Secondary | ICD-10-CM

## 2018-09-01 DIAGNOSIS — M1990 Unspecified osteoarthritis, unspecified site: Secondary | ICD-10-CM | POA: Diagnosis present

## 2018-09-01 DIAGNOSIS — R251 Tremor, unspecified: Secondary | ICD-10-CM | POA: Diagnosis present

## 2018-09-01 DIAGNOSIS — F329 Major depressive disorder, single episode, unspecified: Secondary | ICD-10-CM | POA: Diagnosis present

## 2018-09-01 DIAGNOSIS — K5900 Constipation, unspecified: Secondary | ICD-10-CM | POA: Diagnosis present

## 2018-09-01 DIAGNOSIS — I5031 Acute diastolic (congestive) heart failure: Secondary | ICD-10-CM

## 2018-09-01 DIAGNOSIS — Z9089 Acquired absence of other organs: Secondary | ICD-10-CM

## 2018-09-01 DIAGNOSIS — Z882 Allergy status to sulfonamides status: Secondary | ICD-10-CM

## 2018-09-01 DIAGNOSIS — R600 Localized edema: Secondary | ICD-10-CM

## 2018-09-01 DIAGNOSIS — M109 Gout, unspecified: Secondary | ICD-10-CM | POA: Diagnosis present

## 2018-09-01 DIAGNOSIS — M21969 Unspecified acquired deformity of unspecified lower leg: Secondary | ICD-10-CM | POA: Diagnosis present

## 2018-09-01 DIAGNOSIS — R0602 Shortness of breath: Secondary | ICD-10-CM

## 2018-09-01 DIAGNOSIS — F419 Anxiety disorder, unspecified: Secondary | ICD-10-CM | POA: Diagnosis present

## 2018-09-01 DIAGNOSIS — Z87891 Personal history of nicotine dependence: Secondary | ICD-10-CM

## 2018-09-01 DIAGNOSIS — G4733 Obstructive sleep apnea (adult) (pediatric): Secondary | ICD-10-CM | POA: Diagnosis present

## 2018-09-01 DIAGNOSIS — Z79890 Hormone replacement therapy (postmenopausal): Secondary | ICD-10-CM

## 2018-09-01 DIAGNOSIS — Z79899 Other long term (current) drug therapy: Secondary | ICD-10-CM

## 2018-09-01 DIAGNOSIS — E039 Hypothyroidism, unspecified: Secondary | ICD-10-CM | POA: Diagnosis present

## 2018-09-01 DIAGNOSIS — J441 Chronic obstructive pulmonary disease with (acute) exacerbation: Secondary | ICD-10-CM | POA: Diagnosis present

## 2018-09-01 DIAGNOSIS — R609 Edema, unspecified: Secondary | ICD-10-CM

## 2018-09-01 DIAGNOSIS — E1169 Type 2 diabetes mellitus with other specified complication: Secondary | ICD-10-CM | POA: Diagnosis present

## 2018-09-01 DIAGNOSIS — D693 Immune thrombocytopenic purpura: Secondary | ICD-10-CM | POA: Diagnosis present

## 2018-09-01 DIAGNOSIS — J9601 Acute respiratory failure with hypoxia: Secondary | ICD-10-CM

## 2018-09-01 DIAGNOSIS — Z794 Long term (current) use of insulin: Secondary | ICD-10-CM

## 2018-09-01 DIAGNOSIS — Z90710 Acquired absence of both cervix and uterus: Secondary | ICD-10-CM

## 2018-09-01 DIAGNOSIS — Z91048 Other nonmedicinal substance allergy status: Secondary | ICD-10-CM

## 2018-09-01 DIAGNOSIS — N390 Urinary tract infection, site not specified: Secondary | ICD-10-CM

## 2018-09-01 DIAGNOSIS — J449 Chronic obstructive pulmonary disease, unspecified: Secondary | ICD-10-CM | POA: Diagnosis present

## 2018-09-01 DIAGNOSIS — K5790 Diverticulosis of intestine, part unspecified, without perforation or abscess without bleeding: Secondary | ICD-10-CM | POA: Diagnosis present

## 2018-09-01 DIAGNOSIS — Z881 Allergy status to other antibiotic agents status: Secondary | ICD-10-CM

## 2018-09-01 DIAGNOSIS — R109 Unspecified abdominal pain: Secondary | ICD-10-CM

## 2018-09-01 DIAGNOSIS — Z7982 Long term (current) use of aspirin: Secondary | ICD-10-CM

## 2018-09-01 MED ORDER — ALBUTEROL SULFATE (2.5 MG/3ML) 0.083% IN NEBU
5.0000 mg | INHALATION_SOLUTION | Freq: Once | RESPIRATORY_TRACT | Status: AC
  Administered 2018-09-02: 5 mg via RESPIRATORY_TRACT
  Filled 2018-09-01: qty 6

## 2018-09-01 MED ORDER — DEXTROSE 5 % IV SOLN
INTRAVENOUS | Status: DC
  Administered 2018-09-02: via INTRAVENOUS

## 2018-09-01 NOTE — ED Provider Notes (Signed)
[facility] EMERGENCY DEPARTMENT Provider Note   CSN: [id number] Arrival date & time: 09/01/18  2311  Time seen 23:28 PM    History   Chief Complaint Chief Complaint  Patient presents with  . Took too much insulin    HPI Sabrina Small is a 84 y.o. female.  HPI patient reports she was out with her friends this evening.  When she checked her blood sugar at 8 PM it was 252.  At 9 PM she accidentally took 53 units of NovoLog instead of her 13 units of Levemir that she normally takes with meals 3 times a day.  Her lowest blood sugar this evening was 90, her friends have been getting her to eat candy and snacks.  She states she does not feel bad.  However she has some other complaints that have developed over the past couple months.  She states her mouth is extra dry, she has been shaking for the past 1 to 2 months and she has been having trouble walking because her legs feel weak.  She has been short of breath over the past few weeks and has had a cough that is getting worse over the past few months.  She denies any known fevers.  She describes some urinary incontinence that she has had for a while, especially when she stands up after sitting for a while.  She has noted abdominal bloating for a few months and she has started noticing swelling in her legs for the past few weeks.  She has been wheezing.  She describes using inhalers in the past and just got another inhaler today.  Denies fever, nausea, vomiting, diarrhea, dysuria, or frequency.  She states she quit smoking 20 years ago.  She has not been on oxygen before.  Also states she walked into her wheelchair last week and hit her leg and has a purple spot on her upper leg.  PCP [facility]   Past Medical History:  Diagnosis Date  . Allergy   . Arthritis   . Bell's palsy   . Colon polyps   . Depression   . Diabetes mellitus without complication ([place])   . Diverticulitis   . Gout   . Hypothyroidism   . Sleep apnea      Patient Active Problem List   Diagnosis Date Noted  . Venous ulcer of ankle, right ([place]) 07/16/2018  . Chronic venous insufficiency 07/16/2018  . Lymphedema 07/16/2018  . Hyperlipidemia due to type 2 diabetes mellitus ([place]) 09/06/2017  . Chronic ITP (idiopathic thrombocytopenia) ([facility]) 03/22/2017  . Type 2 diabetes mellitus with diabetic foot deformity ([place]) 03/08/2017  . Gout 03/08/2017  . Hypothyroidism 03/08/2017  . Sepsis ([place]) 11/24/2016    Past Surgical History:  Procedure Laterality Date  . ABDOMINAL HYSTERECTOMY    . CHOLECYSTECTOMY    . COLONOSCOPY WITH PROPOFOL N/A 02/04/2017   Procedure: COLONOSCOPY WITH PROPOFOL;  Surgeon: Lollie Sails, MD;  Location[facility] ENDOSCOPY;  Service: Endoscopy;  Laterality: N/A;  . EXCISION OF BREAST BIOPSY    . HERNIA REPAIR       OB History   None      Home Medications    Prior to Admission medications   Medication Sig Start Date End Date Taking? Authorizing Provider  albuterol (PROAIR HFA) 108 (90 Base) MCG/ACT inhaler ProAir HFA 90 mcg/actuation aerosol inhaler  INHALE 2 PUFFS PO BID PRN FOR 7 DAYS    [provider]  allopurinol (ZYLOPRIM) 100 MG tablet allopurinol  100 mg tablet  Take 1 tablet every day by oral route.    [provider]  aspirin EC 81 MG tablet Take 81 mg by mouth daily.    [provider]  Biotin 1000 MCG tablet Take 1,000 mcg by mouth daily.    [provider]  buPROPion (WELLBUTRIN XL) 150 MG 24 hr tablet Take 150 mg by mouth daily.    [provider]  Cholecalciferol (VITAMIN D3) 1000 units CAPS Take by mouth.    [provider]  Dextran 70-Hypromellose (ARTIFICIAL TEARS) 0.1-0.3 % SOLN Apply to eye.    [provider]  dicyclomine (BENTYL) 10 MG capsule Take 10 mg by mouth as needed for spasms.    [provider]  diphenhydrAMINE (BENADRYL) 25 mg capsule Take by mouth.    [provider]  docusate sodium (COLACE) 100 MG  capsule Take 100 mg by mouth daily.    [provider]  DULoxetine (CYMBALTA) 60 MG capsule Take 60 mg by mouth daily.    [provider]  fenofibrate micronized (LOFIBRA) 200 MG capsule Take 200 mg by mouth daily.    [provider]  FLUTICASONE PROPIONATE, INHAL, IN Inhale into the lungs.    [provider]  Fluticasone-Salmeterol (ADVAIR) 100-50 MCG/DOSE AEPB Inhale into the lungs.    [provider]  hydrOXYzine (ATARAX/VISTARIL) 25 MG tablet Take 25 mg by mouth 3 (three) times daily as needed.    [provider]  insulin aspart (NOVOLOG) 100 UNIT/ML injection Inject 2 Units into the skin 3 (three) times daily before meals. As needed if BG >200    [provider]  Insulin Detemir (LEVEMIR FLEXTOUCH) 100 UNIT/ML Pen Inject 42 Units into the skin at bedtime.     [provider]  levothyroxine (SYNTHROID, LEVOTHROID) 50 MCG tablet Take 75 mcg by mouth daily before breakfast.     [provider]  Lidocaine HCl 4 % CREA Apply topically.    [provider]  Lifitegrast Shirley Friar) 5 % SOLN Apply 1 drop to eye 2 (two) times daily.    [provider]  linaclotide Rolan Lipa) 72 MCG capsule Take by mouth. 04/18/18   [provider]  meloxicam (MOBIC) 15 MG tablet Take 15 mg by mouth daily.    [provider]  Multiple Vitamin (MULTIVITAMIN WITH MINERALS) TABS tablet Take 1 tablet by mouth daily.    [provider]  naproxen sodium (ALEVE) 220 MG tablet Take by mouth.    [provider]  Omega-3 Fatty Acids (FISH OIL) 1000 MG CAPS Take 1,000 mg by mouth 4 (four) times daily.    [provider]  omeprazole (PRILOSEC) 20 MG capsule Take 20 mg by mouth daily.    [provider]  Pitavastatin Calcium (LIVALO) 2 MG TABS Take 2 mg by mouth every evening.     [provider]  pregabalin (LYRICA) 150 MG capsule Take 150 mg by mouth 2 (two) times daily.     [provider]  torsemide (DEMADEX) 100 MG tablet Take by mouth.    [provider]  traMADol-acetaminophen (ULTRACET) 37.5-325 MG tablet Take 1 tablet by mouth 2 (two) times daily as needed for moderate pain. At 2 pm and 8 pm    [provider]  vitamin B-12 (CYANOCOBALAMIN) 1000 MCG tablet Take 1,000 mcg by mouth daily.    [provider]  Vitamin D, Ergocalciferol, (DRISDOL) 50000 units CAPS capsule Take 50,000 Units by mouth every 30 (  thirty) days.     [provider]  Vitamins/Minerals TABS Take by mouth.    [provider]    Family History Family History  Adopted: Yes    Social History Social History   Tobacco Use  . Smoking status: Former Smoker    Packs/day: 2.00    Years: 30.00    Pack years: 60.00    Last attempt to quit: 03/08/1985    Years since quitting: 33.5  . Smokeless tobacco: Never Used  Substance Use Topics  . Alcohol use: No  . Drug use: No  lives in ALF Uses a walker and cane   Allergies   Doxycycline; Dust mite mixed allergen ext [mite (d. farinae)]; Glipizide; Iodinated diagnostic agents; Iodine; Liraglutide; Lortab [hydrocodone-acetaminophen]; Morphine and related; Omnicef [cefdinir]; Other; Penicillins; Percocet [oxycodone-acetaminophen]; Rosuvastatin; Sulfa antibiotics; Sulfamethoxazole-trimethoprim; Sulfasalazine; Trimethoprim; Trovafloxacin; Voltaren [diclofenac sodium]; and Zolpidem   Review of Systems Review of Systems  All other systems reviewed and are negative.    Physical Exam Updated Vital Signs BP (!) 161/83 (BP Location: Left Arm)   Pulse 82   Resp (!) 21   SpO2 95%   Vital signs normal    Physical Exam  Constitutional: She is oriented to person, place, and time. She appears well-developed and well-nourished.  Non-toxic appearance. She does not appear ill. No distress.  HENT:  Head: Normocephalic and atraumatic.  Right Ear: External ear normal.  Left Ear: External ear  normal.  Nose: Nose normal. No mucosal edema or rhinorrhea.  Mouth/Throat: Mucous membranes are dry. No dental abscesses or uvula swelling.  Eyes: Pupils are equal, round, and reactive to light. Conjunctivae and EOM are normal.  Neck: Normal range of motion and full passive range of motion without pain. Neck supple.  Cardiovascular: Normal rate, regular rhythm and normal heart sounds. Exam reveals no gallop and no friction rub.  No murmur heard. Pulmonary/Chest: Tachypnea noted. She is in respiratory distress. She has wheezes. She has no rhonchi. She has no rales. She exhibits no tenderness and no crepitus.  Patient seems short of breath when she talks although she is able to talk in sentences.  Abdominal: Soft. Normal appearance and bowel sounds are normal. She exhibits distension. There is no tenderness. There is no rebound and no guarding.  Her abdomen is very tight and distended to palpation.  Musculoskeletal: Normal range of motion. She exhibits edema. She exhibits no tenderness.  Moves all extremities well.   Neurological: She is alert and oriented to person, place, and time. She has normal strength. No cranial nerve deficit.  Skin: Skin is warm, dry and intact. No rash noted. No erythema. No pallor.  Patient has chronic skin changes of her right lower leg with some hyperpigmentation.  There is also a square shaped purple bruising with a purple blister in the center and her proximal lateral right lower leg.  She has pitting edema of both legs about 1+.  There does not appear to be pitting above the knee.  Psychiatric: She has a normal mood and affect. Her speech is normal and behavior is normal. Her mood appears not anxious.  Nursing note and vitals reviewed.    ED Treatments / Results  Labs (all labs ordered are listed, but only abnormal results are displayed) Results for orders placed or performed during the hospital encounter of 09/01/18  Comprehensive metabolic panel  Result Value  Ref Range   Sodium 138 135 - 145 mmol/L   Potassium 3.4 (L) 3.5 - 5.1 mmol/L  Chloride 108 98 - 111 mmol/L   CO2 22 22 - 32 mmol/L   Glucose, Bld 111 (H) 70 - 99 mg/dL   BUN 21 8 - 23 mg/dL   Creatinine, Ser 1.04 (H) 0.44 - 1.00 mg/dL   Calcium 8.8 (L) 8.9 - 10.3 mg/dL   Total Protein 6.2 (L) 6.5 - 8.1 g/dL   Albumin 3.7 3.5 - 5.0 g/dL   AST 36 15 - 41 U/L   ALT 30 0 - 44 U/L   Alkaline Phosphatase 114 38 - 126 U/L   Total Bilirubin 0.7 0.3 - 1.2 mg/dL   GFR calc non Af Amer 48 (L) >60 mL/min   GFR calc Af Amer 56 (L) >60 mL/min   Anion gap 8 5 - 15  Brain natriuretic peptide  Result Value Ref Range   B Natriuretic Peptide 209.0 (H) 0.0 - 100.0 pg/mL  Troponin I  Result Value Ref Range   Troponin I <0.03 <0.03 ng/mL  CBC with Differential  Result Value Ref Range   WBC 6.9 4.0 - 10.5 K/uL   RBC 4.11 3.87 - 5.11 MIL/uL   Hemoglobin 12.9 12.0 - 15.0 g/dL   HCT 39.1 36.0 - 46.0 %   MCV 95.1 78.0 - 100.0 fL   MCH 31.4 26.0 - 34.0 pg   MCHC 33.0 30.0 - 36.0 g/dL   RDW 15.4 11.5 - 15.5 %   Platelets 74 (L) 150 - 400 K/uL   Neutrophils Relative % 62 %   Neutro Abs 4.2 1.7 - 7.7 K/uL   Lymphocytes Relative 26 %   Lymphs Abs 1.8 0.7 - 4.0 K/uL   Monocytes Relative 7 %   Monocytes Absolute 0.5 0.1 - 1.0 K/uL   Eosinophils Relative 5 %   Eosinophils Absolute 0.4 0.0 - 0.7 K/uL   Basophils Relative 0 %   Basophils Absolute 0.0 0.0 - 0.1 K/uL  Urinalysis, Routine w reflex microscopic  Result Value Ref Range   Color, Urine YELLOW YELLOW   APPearance CLEAR CLEAR   Specific Gravity, Urine 1.006 1.005 - 1.030   pH 5.0 5.0 - 8.0   Glucose, UA 50 (A) NEGATIVE mg/dL   Hgb urine dipstick NEGATIVE NEGATIVE   Bilirubin Urine NEGATIVE NEGATIVE   Ketones, ur NEGATIVE NEGATIVE mg/dL   Protein, ur 100 (A) NEGATIVE mg/dL   Nitrite NEGATIVE NEGATIVE   Leukocytes, UA MODERATE (A) NEGATIVE   RBC / HPF 0-5 0 - 5 RBC/hpf   WBC, UA 11-20 0 - 5 WBC/hpf   Bacteria, UA NONE SEEN NONE SEEN    Squamous Epithelial / LPF 0-5 0 - 5   Mucus PRESENT   CBG monitoring, ED  Result Value Ref Range   Glucose-Capillary 89 70 - 99 mg/dL  CBG monitoring, ED  Result Value Ref Range   Glucose-Capillary 128 (H) 70 - 99 mg/dL  POC CBG, ED  Result Value Ref Range   Glucose-Capillary 139 (H) 70 - 99 mg/dL  POC CBG, ED  Result Value Ref Range   Glucose-Capillary 138 (H) 70 - 99 mg/dL   Laboratory interpretation all normal except possible UTI, improving blood sugar on the D5, mild renal insufficiency, mild hypokalemia    EKG EKG Interpretation  Date/Time:  Saturday September 02 2018 00:17:42 EDT Ventricular Rate:  83 PR Interval:    QRS Duration: 85 QT Interval:  404 QTC Calculation: 475 R Axis:   -2 Text Interpretation:  Sinus rhythm Premature atrial complexes Right atrial enlargement Low voltage, extremity and precordial  leads No old tracing to compare Confirmed by Rolland Porter [phone]) on 09/02/2018 1:17:33 AM   Radiology Dg Chest 1 View  Result Date: 09/02/2018 CLINICAL DATA:  84 year old female with shortness of breath. EXAM: CHEST  1 VIEW COMPARISON:  None. FINDINGS: Shallow inspiration. No focal consolidation, pleural effusion or pneumothorax. Mild cardiomegaly with mild vascular congestion. No acute osseous pathology. IMPRESSION: Cardiomegaly with mild vascular congestion.  No focal consolidation. Electronically Signed   By: Anner Crete M.D.   On: 09/02/2018 00:16    Procedures Procedures (including critical care time)  Medications Ordered in ED Medications  dextrose 5 % solution ( Intravenous New Bag/Given 09/02/18 0011)  albuterol (PROVENTIL) (2.5 MG/3ML) 0.083% nebulizer solution 5 mg (5 mg Nebulization Given 09/02/18 0010)     Initial Impression / Assessment and Plan / ED Course  I have reviewed the triage vital signs and the nursing notes.  Pertinent labs & imaging results that were available during my care of the patient were reviewed by me and considered in my  medical decision making (see chart for details).     Patient was started on D5 at 100 cc/h for concerns of hypoglycemia.  She was also given albuterol nebulizer for her wheezing and shortness of breath.  Recheck it 2 AM patient's CBG has been good however she is still on a D5 drip at 100 cc/h.  Patient states her breathing was better however when she walked to the bathroom which nurses noted she got very short of breath again.  We discussed her test results.  She has a urine that is suspicious for UTI however she has multiple drug allergies.  She states she can only take clindamycin which will not be good for UTI.  I also tried to order her Lasix however there is a drug interaction due to her sulfa allergy, also with torsemide.  Patient's chest x-ray and exam are consistent with congestive heart failure however her BNP is not elevated.  Patient states she has an appointment of her cardiologist in a week.  Her cardiologist is Dr. Nita Sickle at [facility].  When I review her chart she had a visit on September 19 with vascular surgery for lymphedema which they document is improved.  They document she had 2+ edema.  They recommended a lymph pump at that time.  So it appears the edema of her legs has been there longer than she implied to me.  01:40 AM Dr Olevia Bowens, will admit  Final Clinical Impressions(s) / ED Diagnoses   Final diagnoses:  Insulin overdose, accidental or unintentional, initial encounter  Shortness of breath  Peripheral edema  Urinary tract infection without hematuria, site unspecified    Plan admission  Rolland Porter, MD, Barbette Or, MD 09/02/18 [phone]

## 2018-09-01 NOTE — ED Notes (Signed)
Microwave meal given.

## 2018-09-01 NOTE — ED Triage Notes (Signed)
Pt is a resident of [facility] living in [place].  Pt was brought in by friends whom she was spending the evening with.  Pt mistakenly took 53 units of her novolog instead of her levemir at 9 pm.   Since taking the insulin, pt has been eating snacks and drinking soda's.  Per her friends, the patient's blood sugar dropped to 90 at the lowest.

## 2018-09-02 ENCOUNTER — Other Ambulatory Visit (HOSPITAL_COMMUNITY): Payer: Medicare Other

## 2018-09-02 ENCOUNTER — Encounter (HOSPITAL_COMMUNITY): Payer: Self-pay | Admitting: Internal Medicine

## 2018-09-02 ENCOUNTER — Observation Stay (HOSPITAL_BASED_OUTPATIENT_CLINIC_OR_DEPARTMENT_OTHER): Payer: Medicare Other

## 2018-09-02 DIAGNOSIS — I5030 Unspecified diastolic (congestive) heart failure: Secondary | ICD-10-CM | POA: Diagnosis not present

## 2018-09-02 DIAGNOSIS — E162 Hypoglycemia, unspecified: Secondary | ICD-10-CM | POA: Diagnosis not present

## 2018-09-02 DIAGNOSIS — D693 Immune thrombocytopenic purpura: Secondary | ICD-10-CM | POA: Diagnosis not present

## 2018-09-02 DIAGNOSIS — E785 Hyperlipidemia, unspecified: Secondary | ICD-10-CM | POA: Diagnosis present

## 2018-09-02 DIAGNOSIS — J441 Chronic obstructive pulmonary disease with (acute) exacerbation: Secondary | ICD-10-CM

## 2018-09-02 DIAGNOSIS — T383X1A Poisoning by insulin and oral hypoglycemic [antidiabetic] drugs, accidental (unintentional), initial encounter: Secondary | ICD-10-CM

## 2018-09-02 DIAGNOSIS — E782 Mixed hyperlipidemia: Secondary | ICD-10-CM

## 2018-09-02 DIAGNOSIS — I5031 Acute diastolic (congestive) heart failure: Secondary | ICD-10-CM

## 2018-09-02 DIAGNOSIS — J9601 Acute respiratory failure with hypoxia: Secondary | ICD-10-CM

## 2018-09-02 DIAGNOSIS — R251 Tremor, unspecified: Secondary | ICD-10-CM | POA: Diagnosis present

## 2018-09-02 LAB — CBC WITH DIFFERENTIAL/PLATELET
BASOS PCT: 0 %
Basophils Absolute: 0 10*3/uL (ref 0.0–0.1)
EOS ABS: 0.4 10*3/uL (ref 0.0–0.7)
Eosinophils Relative: 5 %
HEMATOCRIT: 39.1 % (ref 36.0–46.0)
Hemoglobin: 12.9 g/dL (ref 12.0–15.0)
Lymphocytes Relative: 26 %
Lymphs Abs: 1.8 10*3/uL (ref 0.7–4.0)
MCH: 31.4 pg (ref 26.0–34.0)
MCHC: 33 g/dL (ref 30.0–36.0)
MCV: 95.1 fL (ref 78.0–100.0)
MONO ABS: 0.5 10*3/uL (ref 0.1–1.0)
MONOS PCT: 7 %
Neutro Abs: 4.2 10*3/uL (ref 1.7–7.7)
Neutrophils Relative %: 62 %
PLATELETS: 74 10*3/uL — AB (ref 150–400)
RBC: 4.11 MIL/uL (ref 3.87–5.11)
RDW: 15.4 % (ref 11.5–15.5)
WBC: 6.9 10*3/uL (ref 4.0–10.5)

## 2018-09-02 LAB — GLUCOSE, CAPILLARY
GLUCOSE-CAPILLARY: 259 mg/dL — AB (ref 70–99)
Glucose-Capillary: 145 mg/dL — ABNORMAL HIGH (ref 70–99)
Glucose-Capillary: 153 mg/dL — ABNORMAL HIGH (ref 70–99)
Glucose-Capillary: 176 mg/dL — ABNORMAL HIGH (ref 70–99)
Glucose-Capillary: 267 mg/dL — ABNORMAL HIGH (ref 70–99)
Glucose-Capillary: 295 mg/dL — ABNORMAL HIGH (ref 70–99)

## 2018-09-02 LAB — COMPREHENSIVE METABOLIC PANEL
ALT: 30 U/L (ref 0–44)
AST: 36 U/L (ref 15–41)
Albumin: 3.7 g/dL (ref 3.5–5.0)
Alkaline Phosphatase: 114 U/L (ref 38–126)
Anion gap: 8 (ref 5–15)
BUN: 21 mg/dL (ref 8–23)
CHLORIDE: 108 mmol/L (ref 98–111)
CO2: 22 mmol/L (ref 22–32)
CREATININE: 1.04 mg/dL — AB (ref 0.44–1.00)
Calcium: 8.8 mg/dL — ABNORMAL LOW (ref 8.9–10.3)
GFR calc Af Amer: 56 mL/min — ABNORMAL LOW (ref >60)
GFR calc non Af Amer: 48 mL/min — ABNORMAL LOW (ref >60)
Glucose, Bld: 111 mg/dL — ABNORMAL HIGH (ref 70–99)
Potassium: 3.4 mmol/L — ABNORMAL LOW (ref 3.5–5.1)
SODIUM: 138 mmol/L (ref 135–145)
Total Bilirubin: 0.7 mg/dL (ref 0.3–1.2)
Total Protein: 6.2 g/dL — ABNORMAL LOW (ref 6.5–8.1)

## 2018-09-02 LAB — MRSA PCR SCREENING: MRSA BY PCR: NEGATIVE

## 2018-09-02 LAB — TROPONIN I: Troponin I: <0.03 ng/mL (ref <0.03)

## 2018-09-02 LAB — CBG MONITORING, ED
GLUCOSE-CAPILLARY: 128 mg/dL — AB (ref 70–99)
GLUCOSE-CAPILLARY: 139 mg/dL — AB (ref 70–99)
GLUCOSE-CAPILLARY: 89 mg/dL (ref 70–99)
Glucose-Capillary: 138 mg/dL — ABNORMAL HIGH (ref 70–99)
Glucose-Capillary: 145 mg/dL — ABNORMAL HIGH (ref 70–99)

## 2018-09-02 LAB — ECHOCARDIOGRAM COMPLETE
HEIGHTINCHES: 60 in
WEIGHTICAEL: 3008.8381 [oz_av]

## 2018-09-02 LAB — BRAIN NATRIURETIC PEPTIDE: B Natriuretic Peptide: 209 pg/mL — ABNORMAL HIGH (ref 0.0–100.0)

## 2018-09-02 LAB — URINALYSIS, ROUTINE W REFLEX MICROSCOPIC
BACTERIA UA: NONE SEEN
Bilirubin Urine: NEGATIVE
Glucose, UA: 50 mg/dL — AB
HGB URINE DIPSTICK: NEGATIVE
Ketones, ur: NEGATIVE mg/dL
NITRITE: NEGATIVE
PH: 5 (ref 5.0–8.0)
Protein, ur: 100 mg/dL — AB
Specific Gravity, Urine: 1.006 (ref 1.005–1.030)

## 2018-09-02 LAB — MAGNESIUM: MAGNESIUM: 1.8 mg/dL (ref 1.7–2.4)

## 2018-09-02 LAB — PHOSPHORUS: PHOSPHORUS: 3.2 mg/dL (ref 2.5–4.6)

## 2018-09-02 MED ORDER — IPRATROPIUM-ALBUTEROL 0.5-2.5 (3) MG/3ML IN SOLN: 3.0000 mL | RESPIRATORY_TRACT | Status: DC | PRN

## 2018-09-02 MED ORDER — PANTOPRAZOLE SODIUM 40 MG PO TBEC
40.0000 mg | DELAYED_RELEASE_TABLET | Freq: Every day | ORAL | Status: DC
  Administered 2018-09-02 – 2018-09-05 (×4): 40 mg via ORAL
  Filled 2018-09-02 (×4): qty 1

## 2018-09-02 MED ORDER — BUDESONIDE 0.5 MG/2ML IN SUSP
0.5000 mg | Freq: Two times a day (BID) | RESPIRATORY_TRACT | Status: DC
  Administered 2018-09-02 – 2018-09-05 (×7): 0.5 mg via RESPIRATORY_TRACT
  Filled 2018-09-02 (×7): qty 2

## 2018-09-02 MED ORDER — PREGABALIN 75 MG PO CAPS
150.0000 mg | ORAL_CAPSULE | Freq: Two times a day (BID) | ORAL | Status: DC
  Administered 2018-09-02 – 2018-09-05 (×7): 150 mg via ORAL
  Filled 2018-09-02 (×7): qty 2

## 2018-09-02 MED ORDER — LEVOTHYROXINE SODIUM 75 MCG PO TABS
75.0000 ug | ORAL_TABLET | Freq: Every day | ORAL | Status: DC
  Administered 2018-09-02 – 2018-09-05 (×4): 75 ug via ORAL
  Filled 2018-09-02 (×4): qty 1

## 2018-09-02 MED ORDER — METHYLPREDNISOLONE SODIUM SUCC 125 MG IJ SOLR
60.0000 mg | Freq: Four times a day (QID) | INTRAMUSCULAR | Status: DC
  Administered 2018-09-02 – 2018-09-03 (×4): 60 mg via INTRAVENOUS
  Filled 2018-09-02 (×4): qty 2

## 2018-09-02 MED ORDER — ALLOPURINOL 100 MG PO TABS
100.0000 mg | ORAL_TABLET | Freq: Every day | ORAL | Status: DC
  Administered 2018-09-02 – 2018-09-05 (×4): 100 mg via ORAL
  Filled 2018-09-02 (×4): qty 1

## 2018-09-02 MED ORDER — METHYLPREDNISOLONE SODIUM SUCC 125 MG IJ SOLR
125.0000 mg | Freq: Once | INTRAMUSCULAR | Status: AC
  Administered 2018-09-02: 125 mg via INTRAVENOUS
  Filled 2018-09-02: qty 2

## 2018-09-02 MED ORDER — INSULIN ASPART 100 UNIT/ML ~~LOC~~ SOLN
0.0000 [IU] | Freq: Three times a day (TID) | SUBCUTANEOUS | Status: DC
  Administered 2018-09-02: 8 [IU] via SUBCUTANEOUS
  Administered 2018-09-02: 3 [IU] via SUBCUTANEOUS
  Administered 2018-09-02: 8 [IU] via SUBCUTANEOUS
  Administered 2018-09-03: 15 [IU] via SUBCUTANEOUS

## 2018-09-02 MED ORDER — INSULIN ASPART 100 UNIT/ML ~~LOC~~ SOLN: 0.0000 [IU] | Freq: Three times a day (TID) | SUBCUTANEOUS | Status: DC

## 2018-09-02 MED ORDER — ONDANSETRON HCL 4 MG PO TABS: 4.0000 mg | ORAL_TABLET | Freq: Four times a day (QID) | ORAL | Status: DC | PRN

## 2018-09-02 MED ORDER — IPRATROPIUM-ALBUTEROL 0.5-2.5 (3) MG/3ML IN SOLN
3.0000 mL | Freq: Four times a day (QID) | RESPIRATORY_TRACT | Status: DC
  Administered 2018-09-02 (×2): 3 mL via RESPIRATORY_TRACT
  Filled 2018-09-02 (×2): qty 3

## 2018-09-02 MED ORDER — LINACLOTIDE 145 MCG PO CAPS
145.0000 ug | ORAL_CAPSULE | Freq: Every day | ORAL | Status: DC
  Administered 2018-09-02 – 2018-09-05 (×4): 145 ug via ORAL
  Filled 2018-09-02 (×4): qty 1

## 2018-09-02 MED ORDER — ACETAMINOPHEN 325 MG PO TABS
650.0000 mg | ORAL_TABLET | Freq: Four times a day (QID) | ORAL | Status: DC | PRN
  Administered 2018-09-02 – 2018-09-04 (×3): 650 mg via ORAL
  Filled 2018-09-02 (×3): qty 2

## 2018-09-02 MED ORDER — BUPROPION HCL 100 MG PO TABS
100.0000 mg | ORAL_TABLET | Freq: Every morning | ORAL | Status: DC
  Administered 2018-09-02 – 2018-09-05 (×4): 100 mg via ORAL
  Filled 2018-09-02 (×4): qty 1

## 2018-09-02 MED ORDER — INSULIN ASPART 100 UNIT/ML ~~LOC~~ SOLN
0.0000 [IU] | Freq: Every day | SUBCUTANEOUS | Status: DC
  Administered 2018-09-02: 3 [IU] via SUBCUTANEOUS

## 2018-09-02 MED ORDER — IPRATROPIUM-ALBUTEROL 0.5-2.5 (3) MG/3ML IN SOLN
3.0000 mL | Freq: Three times a day (TID) | RESPIRATORY_TRACT | Status: DC
  Administered 2018-09-02 – 2018-09-03 (×2): 3 mL via RESPIRATORY_TRACT
  Filled 2018-09-02 (×2): qty 3

## 2018-09-02 MED ORDER — ACETAMINOPHEN 650 MG RE SUPP: 650.0000 mg | Freq: Four times a day (QID) | RECTAL | Status: DC | PRN

## 2018-09-02 MED ORDER — HYDROXYZINE HCL 25 MG PO TABS
25.0000 mg | ORAL_TABLET | Freq: Four times a day (QID) | ORAL | Status: DC | PRN
  Administered 2018-09-04: 25 mg via ORAL
  Filled 2018-09-02: qty 1

## 2018-09-02 MED ORDER — METOPROLOL SUCCINATE ER 25 MG PO TB24
25.0000 mg | ORAL_TABLET | Freq: Every day | ORAL | Status: DC
  Administered 2018-09-02 – 2018-09-03 (×2): 25 mg via ORAL
  Filled 2018-09-02 (×2): qty 1

## 2018-09-02 MED ORDER — FUROSEMIDE 10 MG/ML IJ SOLN
100.0000 mg | Freq: Two times a day (BID) | INTRAVENOUS | Status: DC
  Administered 2018-09-02 – 2018-09-03 (×4): 100 mg via INTRAVENOUS
  Filled 2018-09-02: qty 8
  Filled 2018-09-02 (×6): qty 10

## 2018-09-02 MED ORDER — ONDANSETRON HCL 4 MG/2ML IJ SOLN
4.0000 mg | Freq: Four times a day (QID) | INTRAMUSCULAR | Status: DC | PRN
  Administered 2018-09-04: 4 mg via INTRAVENOUS
  Filled 2018-09-02: qty 2

## 2018-09-02 MED ORDER — TRAMADOL HCL 50 MG PO TABS
50.0000 mg | ORAL_TABLET | Freq: Four times a day (QID) | ORAL | Status: DC | PRN
  Administered 2018-09-02 – 2018-09-04 (×3): 50 mg via ORAL
  Filled 2018-09-02 (×3): qty 1

## 2018-09-02 MED ORDER — DEXTROSE 10 % IV SOLN
INTRAVENOUS | Status: DC
  Administered 2018-09-02: 06:00:00 via INTRAVENOUS

## 2018-09-02 NOTE — Progress Notes (Signed)
  Echocardiogram 2D Echocardiogram has been performed.  Sabrina Small 09/02/2018, 2:59 PM

## 2018-09-02 NOTE — Progress Notes (Signed)
PROGRESS NOTE  Sabrina Small TTS:[id number] DOB: 06-13-1933 DOA: 09/01/2018 PCP: [facility]  Brief History:  84 year old female with a history of diabetes mellitus, hypothyroidism, OSA, hyperlipidemia, coronary disease, ITP, lymphedema of the lower extremities presenting with 2-week history of shortness of breath, wheezing, and hypoglycemia.  On the evening of 09/01/2018, the patient accidentally took 53 units of NovoLog instead of her 53 units of Levemir.  This is actually what prompted the patient to seek medical care because of concerns of hypoglycemia.  During evaluation in the emergency department, it was revealed that the patient has been having shortness of breath and wheezing for the past 2 weeks.  She complains of a nonproductive cough but denies any fevers, chills, nausea, vomiting, diarrhea.  She states that she has gained nearly 20 pounds in the past 2 weeks.  She complains of increasing abdominal girth.  The patient quit smoking approximately 20 years ago with approximately 20-pack-year history.  In the emergency department, the patient was noted to be afebrile hemodynamically stable.  She was noted to have oxygen saturations of 90% on room air.  The patient was placed on supplemental oxygen and started on IV site Medrol.  Assessment/Plan: Acute respiratory failure with hypoxia -Multifactorial including CHF, COPD exacerbation in the setting of OSA/OHS -Wean oxygen for saturation greater 92% -Presently stable on 2 L -Personally reviewed chest x-ray--increased interstitial markings, chronic RLL opacity  Acute diastolic CHF -0/92/3300 Myoview--negative with EF 79% -Echocardiogram -Daily weights -Start IV furosemide 100 mg bid  COPD exacerbation -Start Pulmicort -Start duo nebs -Start IV Solu-Medrol  Hypoglycemia -Secondary to accidental NovoLog administration -Discontinue D10 -CBGs in the mid 100s -At this point, the patient's IV steroids will likely  elevate her CBGs  Diabetes mellitus type 2 -07/20/2018 hemoglobin A 1C--7.7 -Start NovoLog sliding scale once the patient becomes hyperglycemic -Once the patient's CBGs are hyperglycemic/stable, will start half home dose of Levemir  Hypothyroidism -Continue Synthroid   Depression/anxiety -Continue Cymbalta  Constipation -Restart Linzess  ITP -hold ASA for now    Disposition Plan:   ALF 2-3 days  Family Communication: No  Family at bedside  Consultants: none   Code Status:  FULL   DVT Prophylaxis:  SCDs   Procedures: As Listed in Progress Note Above  Antibiotics: None  The patient is critically ill with multiple organ systems failure and requires high complexity decision making for assessment and support, frequent evaluation and titration of therapies, application of advanced monitoring technologies and extensive interpretation of multiple databases.  Critical care time - 45 mins.     Subjective: Patient complains of a nonproductive cough with shortness of breath.  She denies any nausea vomiting, diarrhea, abdominal pain, fevers, chills, headache.  Objective: Vitals:   09/02/18 0430 09/02/18 0450 09/02/18 0600 09/02/18 0700  BP: (!) 147/66 (!) 188/80 (!) 183/83 (!) 165/73  Pulse: 68 74 75 70  Resp: 12 16 14 15   Temp:  97.9 F (36.6 C)    TempSrc:  Oral    SpO2: 94%  95% 95%  Weight:  85.3 kg    Height:  5' (1.524 m)      Intake/Output Summary (Last 24 hours) at 09/02/2018 0730 Last data filed at 09/02/2018 0500 Gross per 24 hour  Intake 237 ml  Output -  Net 237 ml   Weight change:  Exam:   General:  Pt is alert, follows commands appropriately, not in acute distress  HEENT: No icterus,  No thrush, No neck mass, [place]/AT  Cardiovascular: RRR, S1/S2, no rubs, no gallops  Respiratory: Bibasilar rales.  Bilateral expiratory wheeze.  Abdomen: Soft/+BS, non tender, non distended, no guarding  Extremities: 2 + LE edema, No lymphangitis, No petechiae,  No rashes, no synovitis   Data Reviewed: I have personally reviewed following labs and imaging studies Basic Metabolic Panel: Recent Labs  Lab 09/02/18 0010  NA 138  K 3.4*  CL 108  CO2 22  GLUCOSE 111*  BUN 21  CREATININE 1.04*  CALCIUM 8.8*   Liver Function Tests: Recent Labs  Lab 09/02/18 0010  AST 36  ALT 30  ALKPHOS 114  BILITOT 0.7  PROT 6.2*  ALBUMIN 3.7   No results for input(s): LIPASE, AMYLASE in the last 168 hours. No results for input(s): AMMONIA in the last 168 hours. Coagulation Profile: No results for input(s): INR, PROTIME in the last 168 hours. CBC: Recent Labs  Lab 09/02/18 0010  WBC 6.9  NEUTROABS 4.2  HGB 12.9  HCT 39.1  MCV 95.1  PLT 74*   Cardiac Enzymes: Recent Labs  Lab 09/02/18 0010  TROPONINI <0.03   BNP: Invalid input(s): POCBNP CBG: Recent Labs  Lab 09/02/18 0151 09/02/18 0306 09/02/18 0428 09/02/18 0540 09/02/18 0634  GLUCAP 139* 138* 145* 145* 153*   HbA1C: No results for input(s): HGBA1C in the last 72 hours. Urine analysis:    Component Value Date/Time   COLORURINE YELLOW 09/02/2018 0041   APPEARANCEUR CLEAR 09/02/2018 0041   LABSPEC 1.006 09/02/2018 0041   PHURINE 5.0 09/02/2018 0041   GLUCOSEU 50 (A) 09/02/2018 0041   HGBUR NEGATIVE 09/02/2018 0041   BILIRUBINUR NEGATIVE 09/02/2018 0041   KETONESUR NEGATIVE 09/02/2018 0041   PROTEINUR 100 (A) 09/02/2018 0041   NITRITE NEGATIVE 09/02/2018 0041   LEUKOCYTESUR MODERATE (A) 09/02/2018 0041   Sepsis Labs: @LABRCNTIP (procalcitonin:4,lacticidven:4) )No results found for this or any previous visit (from the past 240 hour(s)).   Scheduled Meds: Continuous Infusions:  Procedures/Studies: Dg Chest 1 View  Result Date: 09/02/2018 CLINICAL DATA:  84 year old female with shortness of breath. EXAM: CHEST  1 VIEW COMPARISON:  None. FINDINGS: Shallow inspiration. No focal consolidation, pleural effusion or pneumothorax. Mild cardiomegaly with mild vascular  congestion. No acute osseous pathology. IMPRESSION: Cardiomegaly with mild vascular congestion.  No focal consolidation. Electronically Signed   By: Anner Crete M.D.   On: 09/02/2018 00:16    Orson Eva, DO  [facility] Pager [phone]  If 7PM-7AM, please contact night-coverage [email or web address] Password [email or web address] 09/02/2018, 7:30 AM   LOS: 0 days

## 2018-09-02 NOTE — H&P (Signed)
History and Physical    KIMIYA BRUNELLE TIR:[id number] DOB: 08-30-1933 DOA: 09/01/2018  PCP: [facility]   Patient coming from: [facility] in [place].  I have personally briefly reviewed patient's old medical records in [place]  Chief Complaint:   HPI: Sabrina Small is a 84 y.o. female with medical history significant of allergy, arthritis, Bell's palsy, colon polyps, depression, type 2 diabetes, diverticulosis, gout, hypothyroidism, sleep apnea who is coming to the emergency department with complaints of mistakenly being given 53 units of NovoLog instead of her usual 13 units of Levemir at 2100.  She was having snacks and sweet drinks prior to coming to the ER.  Her lowest level was 90 mg/dL, per patient's friend.  The patient also complains of having a tremor for the past 2 months or so.  She denies fever, chills, sore throat, dyspnea, hemoptysis, chest pain, palpitations, dizziness, diaphoresis, PND or orthopnea.  She has chronic lower extremity edema.  Denies abdominal pain, nausea, emesis, diarrhea, melena or hematochezia.  She gets frequent constipation.  She denies dysuria, frequency or hematuria.  No polyuria, polydipsia, polyphagia or blurred vision.  No heat or cold intolerance.  ED Course: Initial vital signs temperature 97.9 F, pulse 82, respirations 21, blood pressure 161/83 mmHg and O2 sat 95% on room air.  The patient was started on the dextrose 5% infusion.  Her CBC showed a platelet count of 74, but was otherwise normal.  Urinalysis shows glucosuria of 50 and proteinuria 100 mg/dL.  There was moderate leukocyte esterase.  There were 11-20 WBC per hpf.CMP shows a potassium 3.4 mmol/L.  Glucose was 111, creatinine 1.04 and calcium 8.8 mg/dL.  Total protein was 6.2 g/dL.  All other chemistry values are within normal limits.  Troponin was normal.  BNP was 209 pg/mL.  Her chest radiograph showed cardiomegaly with mild vascular  congestion.  There was no focal consolidation.  Review of Systems: As per HPI otherwise 10 point review of systems negative.   Past Medical History:  Diagnosis Date  . Allergy   . Arthritis   . Bell's palsy   . Colon polyps   . Depression   . Diabetes mellitus without complication ([place])   . Diverticulitis   . Gout   . Hypothyroidism   . Sleep apnea     Past Surgical History:  Procedure Laterality Date  . ABDOMINAL HYSTERECTOMY    . CHOLECYSTECTOMY    . COLONOSCOPY WITH PROPOFOL N/A 02/04/2017   Procedure: COLONOSCOPY WITH PROPOFOL;  Surgeon: Lollie Sails, MD;  Location: [facility] ENDOSCOPY;  Service: Endoscopy;  Laterality: N/A;  . EXCISION OF BREAST BIOPSY    . HERNIA REPAIR       reports that she quit smoking about 33 years ago. She has a 60.00 pack-year smoking history. She has never used smokeless tobacco. She reports that she does not drink alcohol or use drugs.  Allergies  Allergen Reactions  . Doxycycline     Patient cannot recall details of reaction  . Dust Mite Mixed Allergen Ext [Mite (D. Farinae)]   . Glipizide   . Iodinated Diagnostic Agents   . Iodine Other (See Comments)  . Liraglutide Other (See Comments)  . Lortab [Hydrocodone-Acetaminophen]   . Morphine And Related   . Omnicef [Cefdinir] Other (See Comments)    Patient cannot recall details of reaction  . Other     Cat dander  . Penicillins Other (See Comments)    Has patient  had a PCN reaction causing immediate rash, facial/tongue/throat swelling, SOB or lightheadedness with hypotension: Unknown Has patient had a PCN reaction causing severe rash involving mucus membranes or skin necrosis: Unknown Has patient had a PCN reaction that required hospitalization: Unknown Has patient had a PCN reaction occurring within the last 10 years: No If all of the above answers are "NO", then may proceed with Cephalosporin use. "I blow up and pass out" Over 10 yrs ago.  Marland Kitchen Percocet [Oxycodone-Acetaminophen] Other  (See Comments)  . Rosuvastatin   . Sulfa Antibiotics Other (See Comments)    Patient cannot recall details of reaction  . Sulfamethoxazole-Trimethoprim     Other reaction(s): Unknown Other reaction(s): Unknown   . Sulfasalazine     Other reaction(s): Other (See Comments) Patient cannot recall details of reaction  . Trimethoprim   . Trovafloxacin     Patient cannot recall details of reaction  . Voltaren [Diclofenac Sodium] Other (See Comments)  . Zolpidem     Family History  Adopted: Yes    Prior to Admission medications   Medication Sig Start Date End Date Taking? Authorizing Provider  albuterol (PROAIR HFA) 108 (90 Base) MCG/ACT inhaler ProAir HFA 90 mcg/actuation aerosol inhaler  INHALE 2 PUFFS PO BID PRN FOR 7 DAYS    [provider]  allopurinol (ZYLOPRIM) 100 MG tablet allopurinol 100 mg tablet  Take 1 tablet every day by oral route.    [provider]  aspirin EC 81 MG tablet Take 81 mg by mouth daily.    [provider]  Biotin 1000 MCG tablet Take 1,000 mcg by mouth daily.    [provider]  buPROPion (WELLBUTRIN XL) 150 MG 24 hr tablet Take 150 mg by mouth daily.    [provider]  Cholecalciferol (VITAMIN D3) 1000 units CAPS Take by mouth.    [provider]  Dextran 70-Hypromellose (ARTIFICIAL TEARS) 0.1-0.3 % SOLN Apply to eye.    [provider]  dicyclomine (BENTYL) 10 MG capsule Take 10 mg by mouth as needed for spasms.    [provider]  diphenhydrAMINE (BENADRYL) 25 mg capsule Take by mouth.    [provider]  docusate sodium (COLACE) 100 MG capsule Take 100 mg by mouth daily.    [provider]  DULoxetine (CYMBALTA) 60 MG capsule Take 60 mg by mouth daily.    [provider]  fenofibrate micronized (LOFIBRA) 200 MG capsule Take 200 mg by mouth daily.    [provider]  FLUTICASONE PROPIONATE, INHAL, IN Inhale into the lungs.    [provider]  Fluticasone-Salmeterol (ADVAIR) 100-50 MCG/DOSE AEPB Inhale into the lungs.    [provider]  hydrOXYzine (ATARAX/VISTARIL) 25 MG tablet Take 25 mg by mouth 3 (three) times daily as needed.    [provider]  insulin aspart (NOVOLOG) 100 UNIT/ML injection Inject 2 Units into the skin 3 (three) times daily before meals. As needed if BG >200    [provider]  Insulin Detemir (LEVEMIR FLEXTOUCH) 100 UNIT/ML Pen Inject 42 Units into the skin at bedtime.     [provider]  levothyroxine (SYNTHROID, LEVOTHROID) 50 MCG tablet Take 75 mcg by mouth daily before breakfast.     [provider]  Lidocaine HCl 4 % CREA Apply topically.    [provider]  Lifitegrast Shirley Friar) 5 % SOLN Apply 1 drop to eye 2 (two) times daily.    [provider]  linaclotide Rolan Lipa) 72 MCG  capsule Take by mouth. 04/18/18   [provider]  meloxicam (MOBIC) 15 MG tablet Take 15 mg by mouth daily.    [provider]  Multiple Vitamin (MULTIVITAMIN WITH MINERALS) TABS tablet Take 1 tablet by mouth daily.    [provider]  naproxen sodium (ALEVE) 220 MG tablet Take by mouth.    [provider]  Omega-3 Fatty Acids (FISH OIL) 1000 MG CAPS Take 1,000 mg by mouth 4 (four) times daily.    [provider]  omeprazole (PRILOSEC) 20 MG capsule Take 20 mg by mouth daily.    [provider]  Pitavastatin Calcium (LIVALO) 2 MG TABS Take 2 mg by mouth every evening.     [provider]  pregabalin (LYRICA) 150 MG capsule Take 150 mg by mouth 2 (two) times daily.    [provider]  torsemide (DEMADEX) 100 MG tablet Take by mouth.    [provider]  traMADol-acetaminophen (ULTRACET) 37.5-325 MG tablet Take 1 tablet by mouth 2 (two) times daily as needed for moderate pain. At 2 pm and 8 pm    [provider]  vitamin B-12 (CYANOCOBALAMIN) 1000 MCG tablet Take 1,000 mcg by mouth  daily.    [provider]  Vitamin D, Ergocalciferol, (DRISDOL) 50000 units CAPS capsule Take 50,000 Units by mouth every 30 (thirty) days.     [provider]  Vitamins/Minerals TABS Take by mouth.    [provider]    Physical Exam: Vitals:   09/02/18 0230 09/02/18 0300 09/02/18 0430 09/02/18 0450  BP: (!) 179/84 (!) 181/86 (!) 147/66 (!) 188/80  Pulse: 74 74 68 74  Resp: 16  12 16   Temp:    97.9 F (36.6 C)  TempSrc:    Oral  SpO2: 91% 92% 94%   Weight:    85.3 kg  Height:    5' (1.524 m)    Constitutional: NAD, calm, comfortable Eyes: PERRL, lids and conjunctivae normal ENMT: Mucous membranes are moist. Posterior pharynx clear of any exudate or lesions. Neck: Normal, supple, no masses, no thyromegaly Respiratory: Decreased breath sounds with bilateral wheezing, no crackles. Normal respiratory effort. No accessory muscle use.  Cardiovascular: Regular rate and rhythm, no murmurs / rubs / gallops.  Stage I lymphedema.  2+ lower extremities pitting edema. 2+ pedal pulses. No carotid bruits.  Abdomen: Obese, distended, no tenderness, no masses palpated. No hepatosplenomegaly. Bowel sounds positive.  Musculoskeletal: no clubbing / cyanosis.  Good ROM, no contractures. Normal muscle tone.  Skin: Positive erythema on hands, left >> right.  There are small areas of ecchymosis on all extremities.  There is a hemorrhagic bulla with surrounding ecchymosis on the lateral aspect of her LLE. Neurologic: Positive basal tremor, CN 2-12 grossly intact. Sensation intact, DTR normal.  Moves all extremities.  Unable to fully evaluate Psychiatric: Normal judgment and insight. Alert and oriented x 3. Normal mood.    Labs on Admission: I have personally reviewed following labs and imaging studies  CBC: Recent Labs  Lab 09/02/18 0010  WBC 6.9  NEUTROABS 4.2  HGB 12.9  HCT 39.1  MCV 95.1  PLT 74*   Basic Metabolic Panel: Recent Labs  Lab 09/02/18 0010  NA 138    K 3.4*  CL 108  CO2 22  GLUCOSE 111*  BUN 21  CREATININE 1.04*  CALCIUM 8.8*   GFR: Estimated Creatinine Clearance: 39.7 mL/min (A) (by C-G formula based on SCr of 1.04 mg/dL (H)). Liver Function Tests: Recent  Labs  Lab 09/02/18 0010  AST 36  ALT 30  ALKPHOS 114  BILITOT 0.7  PROT 6.2*  ALBUMIN 3.7   No results for input(s): LIPASE, AMYLASE in the last 168 hours. No results for input(s): AMMONIA in the last 168 hours. Coagulation Profile: No results for input(s): INR, PROTIME in the last 168 hours. Cardiac Enzymes: Recent Labs  Lab 09/02/18 0010  TROPONINI <0.03   BNP (last 3 results) No results for input(s): PROBNP in the last 8760 hours. HbA1C: No results for input(s): HGBA1C in the last 72 hours. CBG: Recent Labs  Lab 09/02/18 0041 09/02/18 0151 09/02/18 0306 09/02/18 0428 09/02/18 0540  GLUCAP 128* 139* 138* 145* 145*   Lipid Profile: No results for input(s): CHOL, HDL, LDLCALC, TRIG, CHOLHDL, LDLDIRECT in the last 72 hours. Thyroid Function Tests: No results for input(s): TSH, T4TOTAL, FREET4, T3FREE, THYROIDAB in the last 72 hours. Anemia Panel: No results for input(s): VITAMINB12, FOLATE, FERRITIN, TIBC, IRON, RETICCTPCT in the last 72 hours. Urine analysis:    Component Value Date/Time   COLORURINE YELLOW 09/02/2018 0041   APPEARANCEUR CLEAR 09/02/2018 0041   LABSPEC 1.006 09/02/2018 0041   PHURINE 5.0 09/02/2018 0041   GLUCOSEU 50 (A) 09/02/2018 0041   HGBUR NEGATIVE 09/02/2018 0041   BILIRUBINUR NEGATIVE 09/02/2018 0041   KETONESUR NEGATIVE 09/02/2018 0041   PROTEINUR 100 (A) 09/02/2018 0041   NITRITE NEGATIVE 09/02/2018 0041   LEUKOCYTESUR MODERATE (A) 09/02/2018 0041    Radiological Exams on Admission: Dg Chest 1 View  Result Date: 09/02/2018 CLINICAL DATA:  84 year old female with shortness of breath. EXAM: CHEST  1 VIEW COMPARISON:  None. FINDINGS: Shallow inspiration. No focal consolidation, pleural effusion or pneumothorax. Mild  cardiomegaly with mild vascular congestion. No acute osseous pathology. IMPRESSION: Cardiomegaly with mild vascular congestion.  No focal consolidation. Electronically Signed   By: Anner Crete M.D.   On: 09/02/2018 00:16    EKG: Independently reviewed.  Vent. rate 83 BPM PR interval * ms QRS duration 85 ms QT/QTc 404/475 ms P-R-T axes 29 -2 58 Sinus rhythm Premature atrial complexes Right atrial enlargement Low voltage, extremity and precordial leads No previous EKG on file.  Assessment/Plan Principal Problem:   Hypoglycemia Observation/telemetry. Continue D10 infusion. Monitor CBG closely. Hold NovoLog and Levemir at this time.  Active Problems:   COPD with acute exacerbation (HCC) Continue supplemental oxygen. Continue bronchodilators. Solu-Medrol 125 mg IVP x1 dose.    Tremor of unknown origin This has been going on for the past 2 months. 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 Hold NovoLog and Levemir for now.    Gout Continue allopurinol once dose is verified.    Hypothyroidism Continue levothyroxine. Dose to be confirmed later today.    Chronic ITP (idiopathic thrombocytopenia) (HCC) Monitor platelet count.    Hyperlipidemia  Resume pitavastatin and fenofibrate once dosage confirmed.       DVT prophylaxis: SCDs. Code Status: Full code. Family Communication: Disposition Plan: Observation for hypoglycemia and COPD exacerbation treatment. Consults called:  Admission status: Observation/telemetry.   Reubin Milan MD [facility] Pager [phone].  If 7PM-7AM, please contact night-coverage [email or web address] Password [email or web address]  09/02/2018, 6:20 AM

## 2018-09-02 NOTE — ED Notes (Signed)
Assisted  patient to restroom in her room to obtain urine specimen. Patient very shaky at this time. Patient is very wheezy and breathing fast. Patient states that her legs where cramping and burning. Asked patient if she needed wheelchair back to bed she states no. Assisted back to bed, RN Christy in room at this time. Patient very SOB, patient repositioned in bed.

## 2018-09-02 NOTE — Progress Notes (Signed)
Dr Tat made aware of blood pressures throughout the day, patient denies any chest pain, shortness of breath, nausea or vomiting.  PO metoprolol ordered and given. Patient also complained of headache, PRN medications given, and of periodic tremors. Dr. Carles Collet also made aware and in to assess patient and spoke with daughter. Appetite fair.

## 2018-09-03 DIAGNOSIS — M21969 Unspecified acquired deformity of unspecified lower leg: Secondary | ICD-10-CM | POA: Diagnosis present

## 2018-09-03 DIAGNOSIS — Z79899 Other long term (current) drug therapy: Secondary | ICD-10-CM | POA: Diagnosis not present

## 2018-09-03 DIAGNOSIS — R32 Unspecified urinary incontinence: Secondary | ICD-10-CM | POA: Diagnosis present

## 2018-09-03 DIAGNOSIS — T383X1A Poisoning by insulin and oral hypoglycemic [antidiabetic] drugs, accidental (unintentional), initial encounter: Secondary | ICD-10-CM | POA: Diagnosis present

## 2018-09-03 DIAGNOSIS — I110 Hypertensive heart disease with heart failure: Secondary | ICD-10-CM | POA: Diagnosis present

## 2018-09-03 DIAGNOSIS — R0602 Shortness of breath: Secondary | ICD-10-CM | POA: Diagnosis present

## 2018-09-03 DIAGNOSIS — D693 Immune thrombocytopenic purpura: Secondary | ICD-10-CM | POA: Diagnosis present

## 2018-09-03 DIAGNOSIS — E1169 Type 2 diabetes mellitus with other specified complication: Secondary | ICD-10-CM

## 2018-09-03 DIAGNOSIS — E785 Hyperlipidemia, unspecified: Secondary | ICD-10-CM | POA: Diagnosis present

## 2018-09-03 DIAGNOSIS — Z9089 Acquired absence of other organs: Secondary | ICD-10-CM | POA: Diagnosis not present

## 2018-09-03 DIAGNOSIS — F329 Major depressive disorder, single episode, unspecified: Secondary | ICD-10-CM | POA: Diagnosis present

## 2018-09-03 DIAGNOSIS — R251 Tremor, unspecified: Secondary | ICD-10-CM

## 2018-09-03 DIAGNOSIS — J449 Chronic obstructive pulmonary disease, unspecified: Secondary | ICD-10-CM | POA: Diagnosis present

## 2018-09-03 DIAGNOSIS — E11649 Type 2 diabetes mellitus with hypoglycemia without coma: Secondary | ICD-10-CM | POA: Diagnosis present

## 2018-09-03 DIAGNOSIS — Z885 Allergy status to narcotic agent status: Secondary | ICD-10-CM | POA: Diagnosis not present

## 2018-09-03 DIAGNOSIS — Z881 Allergy status to other antibiotic agents status: Secondary | ICD-10-CM | POA: Diagnosis not present

## 2018-09-03 DIAGNOSIS — Z794 Long term (current) use of insulin: Secondary | ICD-10-CM | POA: Diagnosis not present

## 2018-09-03 DIAGNOSIS — E039 Hypothyroidism, unspecified: Secondary | ICD-10-CM

## 2018-09-03 DIAGNOSIS — F419 Anxiety disorder, unspecified: Secondary | ICD-10-CM | POA: Diagnosis present

## 2018-09-03 DIAGNOSIS — M109 Gout, unspecified: Secondary | ICD-10-CM | POA: Diagnosis present

## 2018-09-03 DIAGNOSIS — Z79890 Hormone replacement therapy (postmenopausal): Secondary | ICD-10-CM | POA: Diagnosis not present

## 2018-09-03 DIAGNOSIS — R140 Abdominal distension (gaseous): Secondary | ICD-10-CM | POA: Diagnosis not present

## 2018-09-03 DIAGNOSIS — J441 Chronic obstructive pulmonary disease with (acute) exacerbation: Secondary | ICD-10-CM | POA: Diagnosis present

## 2018-09-03 DIAGNOSIS — G4733 Obstructive sleep apnea (adult) (pediatric): Secondary | ICD-10-CM | POA: Diagnosis present

## 2018-09-03 DIAGNOSIS — N390 Urinary tract infection, site not specified: Secondary | ICD-10-CM | POA: Diagnosis present

## 2018-09-03 DIAGNOSIS — I5031 Acute diastolic (congestive) heart failure: Secondary | ICD-10-CM | POA: Diagnosis present

## 2018-09-03 DIAGNOSIS — J9601 Acute respiratory failure with hypoxia: Secondary | ICD-10-CM | POA: Diagnosis present

## 2018-09-03 DIAGNOSIS — Z86010 Personal history of colonic polyps: Secondary | ICD-10-CM | POA: Diagnosis not present

## 2018-09-03 DIAGNOSIS — Z87891 Personal history of nicotine dependence: Secondary | ICD-10-CM | POA: Diagnosis not present

## 2018-09-03 LAB — MAGNESIUM: MAGNESIUM: 1.7 mg/dL (ref 1.7–2.4)

## 2018-09-03 LAB — BASIC METABOLIC PANEL
Anion gap: 14 (ref 5–15)
BUN: 28 mg/dL — ABNORMAL HIGH (ref 8–23)
CO2: 25 mmol/L (ref 22–32)
Calcium: 9 mg/dL (ref 8.9–10.3)
Chloride: 100 mmol/L (ref 98–111)
Creatinine, Ser: 1.22 mg/dL — ABNORMAL HIGH (ref 0.44–1.00)
GFR, EST AFRICAN AMERICAN: 46 mL/min — AB (ref >60)
GFR, EST NON AFRICAN AMERICAN: 40 mL/min — AB (ref >60)
Glucose, Bld: 370 mg/dL — ABNORMAL HIGH (ref 70–99)
POTASSIUM: 3.8 mmol/L (ref 3.5–5.1)
SODIUM: 139 mmol/L (ref 135–145)

## 2018-09-03 LAB — GLUCOSE, CAPILLARY
GLUCOSE-CAPILLARY: 354 mg/dL — AB (ref 70–99)
GLUCOSE-CAPILLARY: 268 mg/dL — AB (ref 70–99)
GLUCOSE-CAPILLARY: 296 mg/dL — AB (ref 70–99)
Glucose-Capillary: 395 mg/dL — ABNORMAL HIGH (ref 70–99)

## 2018-09-03 MED ORDER — INSULIN ASPART 100 UNIT/ML ~~LOC~~ SOLN
0.0000 [IU] | Freq: Three times a day (TID) | SUBCUTANEOUS | Status: DC
  Administered 2018-09-03: 11 [IU] via SUBCUTANEOUS
  Administered 2018-09-03: 20 [IU] via SUBCUTANEOUS
  Administered 2018-09-04: 7 [IU] via SUBCUTANEOUS
  Administered 2018-09-04: 20 [IU] via SUBCUTANEOUS
  Administered 2018-09-04: 15 [IU] via SUBCUTANEOUS
  Administered 2018-09-05: 7 [IU] via SUBCUTANEOUS
  Administered 2018-09-05: 20 [IU] via SUBCUTANEOUS

## 2018-09-03 MED ORDER — MAGNESIUM SULFATE 2 GM/50ML IV SOLN
2.0000 g | Freq: Once | INTRAVENOUS | Status: AC
  Administered 2018-09-03: 2 g via INTRAVENOUS
  Filled 2018-09-03: qty 50

## 2018-09-03 MED ORDER — INSULIN ASPART 100 UNIT/ML ~~LOC~~ SOLN
0.0000 [IU] | Freq: Every day | SUBCUTANEOUS | Status: DC
  Administered 2018-09-03: 3 [IU] via SUBCUTANEOUS
  Administered 2018-09-04: 2 [IU] via SUBCUTANEOUS

## 2018-09-03 MED ORDER — IPRATROPIUM-ALBUTEROL 0.5-2.5 (3) MG/3ML IN SOLN
3.0000 mL | Freq: Two times a day (BID) | RESPIRATORY_TRACT | Status: DC
  Administered 2018-09-03 – 2018-09-05 (×4): 3 mL via RESPIRATORY_TRACT
  Filled 2018-09-03 (×4): qty 3

## 2018-09-03 MED ORDER — INSULIN DETEMIR 100 UNIT/ML ~~LOC~~ SOLN
25.0000 [IU] | Freq: Every day | SUBCUTANEOUS | Status: DC
  Administered 2018-09-03 – 2018-09-05 (×3): 25 [IU] via SUBCUTANEOUS
  Filled 2018-09-03 (×4): qty 0.25

## 2018-09-03 MED ORDER — METHYLPREDNISOLONE SODIUM SUCC 125 MG IJ SOLR
60.0000 mg | Freq: Two times a day (BID) | INTRAMUSCULAR | Status: DC
  Administered 2018-09-03 – 2018-09-04 (×2): 60 mg via INTRAVENOUS
  Filled 2018-09-03 (×2): qty 2

## 2018-09-03 MED ORDER — METOPROLOL SUCCINATE ER 25 MG PO TB24
25.0000 mg | ORAL_TABLET | Freq: Once | ORAL | Status: AC
  Administered 2018-09-03: 25 mg via ORAL
  Filled 2018-09-03: qty 1

## 2018-09-03 MED ORDER — METOPROLOL SUCCINATE ER 50 MG PO TB24
50.0000 mg | ORAL_TABLET | Freq: Every day | ORAL | Status: DC
  Administered 2018-09-04 – 2018-09-05 (×2): 50 mg via ORAL
  Filled 2018-09-03 (×2): qty 1

## 2018-09-03 MED ORDER — HYDRALAZINE HCL 20 MG/ML IJ SOLN
5.0000 mg | Freq: Three times a day (TID) | INTRAMUSCULAR | Status: DC | PRN
  Administered 2018-09-03: 5 mg via INTRAVENOUS
  Filled 2018-09-03 (×2): qty 1

## 2018-09-03 NOTE — Progress Notes (Signed)
PROGRESS NOTE  BARRY CULVERHOUSE ZOX:[id number] DOB: 16-Feb-1933 DOA: 09/01/2018 PCP: [facility]   Brief History:  84 year old female with a history of diabetes mellitus, hypothyroidism, OSA, hyperlipidemia, coronary disease, ITP, lymphedema of the lower extremities presenting with 2-week history of shortness of breath, wheezing, and hypoglycemia.  On the evening of 09/01/2018, the patient accidentally took 53 units of NovoLog instead of her 53 units of Levemir.  This is actually what prompted the patient to seek medical care because of concerns of hypoglycemia.  During evaluation in the emergency department, it was revealed that the patient has been having shortness of breath and wheezing for the past 2 weeks.  She complains of a nonproductive cough but denies any fevers, chills, nausea, vomiting, diarrhea.  She states that she has gained nearly 20 pounds in the past 2 weeks.  She complains of increasing abdominal girth.  The patient quit smoking approximately 20 years ago with approximately 20-pack-year history.  In the emergency department, the patient was noted to be afebrile hemodynamically stable.  She was noted to have oxygen saturations of 90% on room air.  The patient was placed on supplemental oxygen and started on IV Solu-Medrol.  Assessment/Plan: Acute respiratory failure with hypoxia -Multifactorial including CHF, COPD exacerbation in the setting of OSA/OHS -Wean oxygen for saturation greater 92% -Presently stable on 3 L -Personally reviewed chest x-ray--increased interstitial markings, chronic RLL opacity  Acute diastolic CHF -07/16/9146 Myoview--negative with EF 79% -Echocardiogram--EF 60-65%, no WMA, G1DD -Daily weights -continue IV furosemide 100 mg bid  COPD exacerbation -Continue Pulmicort -continue duo nebs -continue IV Solu-Medrol  Diabetes mellitus type 2 -07/20/2018 hemoglobin A 1C--7.7 -restart levemir, reduced dose -increase to resistant  sliding scale  Hypertension -increase metoprolol succinate to 50 mg daily  Hypoglycemia -Secondary to accidental NovoLog administration -Discontinue D10 -resolved -At this point, the patient's IV steroids elevate her CBGs  Hypothyroidism -Continue Synthroid   Depression/anxiety -Continue Cymbalta  Constipation -Restart Linzess  ITP -hold ASA for now    Disposition Plan:   ALF 1-2 days  Family Communication: Daughter updated at bedside--Total time spent 35 minutes.  Greater than 50% spent face to face counseling and coordinating care.   Consultants: none   Code Status:  FULL   DVT Prophylaxis:  SCDs   Procedures: As Listed in Progress Note Above  Antibiotics: None        Subjective: Pt is breathing better, but still dyspneic with exertion.  Denies cp, n/v/d, abd pain.  No headache  Objective: Vitals:   09/03/18 0700 09/03/18 0838 09/03/18 0857 09/03/18 0915  BP: (!) 153/86   (!) 161/74  Pulse: 77     Resp: 11     Temp:      TempSrc:      SpO2: 94% 94% 93%   Weight:      Height:        Intake/Output Summary (Last 24 hours) at 09/03/2018 1005 Last data filed at 09/02/2018 2100 Gross per 24 hour  Intake 533.97 ml  Output 4400 ml  Net -3866.03 ml   Weight change: -6.7 kg Exam:   General:  Pt is alert, follows commands appropriately, not in acute distress  HEENT: No icterus, No thrush, No neck mass, [place]/AT  Cardiovascular: RRR, S1/S2, no rubs, no gallops  Respiratory: bibasilar crackles, no wheeze  Abdomen: Soft/+BS, non tender, non distended, no guarding  Extremities: 1 + LE edema, No lymphangitis, No petechiae, No rashes, no  synovitis   Data Reviewed: I have personally reviewed following labs and imaging studies Basic Metabolic Panel: Recent Labs  Lab 09/02/18 0010 09/03/18 0445  NA 138 139  K 3.4* 3.8  CL 108 100  CO2 22 25  GLUCOSE 111* 370*  BUN 21 28*  CREATININE 1.04* 1.22*  CALCIUM 8.8* 9.0  MG  1.8 1.7  PHOS 3.2  --    Liver Function Tests: Recent Labs  Lab 09/02/18 0010  AST 36  ALT 30  ALKPHOS 114  BILITOT 0.7  PROT 6.2*  ALBUMIN 3.7   No results for input(s): LIPASE, AMYLASE in the last 168 hours. No results for input(s): AMMONIA in the last 168 hours. Coagulation Profile: No results for input(s): INR, PROTIME in the last 168 hours. CBC: Recent Labs  Lab 09/02/18 0010  WBC 6.9  NEUTROABS 4.2  HGB 12.9  HCT 39.1  MCV 95.1  PLT 74*   Cardiac Enzymes: Recent Labs  Lab 09/02/18 0010  TROPONINI <0.03   BNP: Invalid input(s): POCBNP CBG: Recent Labs  Lab 09/02/18 0743 09/02/18 1112 09/02/18 1621 09/02/18 2115 09/03/18 0820  GLUCAP 176* 259* 267* 295* 354*   HbA1C: No results for input(s): HGBA1C in the last 72 hours. Urine analysis:    Component Value Date/Time   COLORURINE YELLOW 09/02/2018 0041   APPEARANCEUR CLEAR 09/02/2018 0041   LABSPEC 1.006 09/02/2018 0041   PHURINE 5.0 09/02/2018 0041   GLUCOSEU 50 (A) 09/02/2018 0041   HGBUR NEGATIVE 09/02/2018 0041   BILIRUBINUR NEGATIVE 09/02/2018 0041   KETONESUR NEGATIVE 09/02/2018 0041   PROTEINUR 100 (A) 09/02/2018 0041   NITRITE NEGATIVE 09/02/2018 0041   LEUKOCYTESUR MODERATE (A) 09/02/2018 0041   Sepsis Labs: @LABRCNTIP (procalcitonin:4,lacticidven:4) ) Recent Results (from the past 240 hour(s))  MRSA PCR Screening     Status: None   Collection Time: 09/02/18  5:00 AM  Result Value Ref Range Status   MRSA by PCR NEGATIVE NEGATIVE Final    Comment:        The GeneXpert MRSA Assay (FDA approved for NASAL specimens only), is one component of a comprehensive MRSA colonization surveillance program. It is not intended to diagnose MRSA infection nor to guide or monitor treatment for MRSA infections. Performed at [facility], [address] [place], [place] [address]      Scheduled Meds: . allopurinol  100 mg Oral Daily  . budesonide (PULMICORT) nebulizer solution  0.5 mg  Nebulization BID  . buPROPion  100 mg Oral q morning - 10a  . insulin aspart  0-15 Units Subcutaneous TID WC  . insulin aspart  0-5 Units Subcutaneous QHS  . ipratropium-albuterol  3 mL Nebulization TID  . levothyroxine  75 mcg Oral QAC breakfast  . linaclotide  145 mcg Oral QAC breakfast  . methylPREDNISolone (SOLU-MEDROL) injection  60 mg Intravenous Q6H  . metoprolol succinate  25 mg Oral Daily  . pantoprazole  40 mg Oral Daily  . pregabalin  150 mg Oral BID   Continuous Infusions: . furosemide 100 mg (09/02/18 1827)  . magnesium sulfate 1 - 4 g bolus IVPB      Procedures/Studies: Dg Chest 1 View  Result Date: 09/02/2018 CLINICAL DATA:  84 year old female with shortness of breath. EXAM: CHEST  1 VIEW COMPARISON:  None. FINDINGS: Shallow inspiration. No focal consolidation, pleural effusion or pneumothorax. Mild cardiomegaly with mild vascular congestion. No acute osseous pathology. IMPRESSION: Cardiomegaly with mild vascular congestion.  No focal consolidation. Electronically Signed   By: Laren Everts.D.  On: 09/02/2018 00:16    Orson Eva, DO  [facility] Pager [phone]  If 7PM-7AM, please contact night-coverage [email or web address] Password [email or web address] 09/03/2018, 10:05 AM   LOS: 0 days

## 2018-09-03 NOTE — Progress Notes (Signed)
AVS paperwork reviewed with pt. Assisted pt with packing belongings. PIVs x 2 removed without difficulty. Transferred to private vehicle via w/c.

## 2018-09-03 NOTE — Progress Notes (Signed)
Dr. Denton Brick notified of elevated blood pressure 174/95.  No new orders given.

## 2018-09-04 ENCOUNTER — Inpatient Hospital Stay (HOSPITAL_COMMUNITY): Payer: Medicare Other

## 2018-09-04 DIAGNOSIS — R140 Abdominal distension (gaseous): Secondary | ICD-10-CM

## 2018-09-04 LAB — BASIC METABOLIC PANEL
Anion gap: 12 (ref 5–15)
BUN: 47 mg/dL — ABNORMAL HIGH (ref 8–23)
CHLORIDE: 100 mmol/L (ref 98–111)
CO2: 27 mmol/L (ref 22–32)
Calcium: 9 mg/dL (ref 8.9–10.3)
Creatinine, Ser: 1.48 mg/dL — ABNORMAL HIGH (ref 0.44–1.00)
GFR calc non Af Amer: 32 mL/min — ABNORMAL LOW (ref >60)
GFR, EST AFRICAN AMERICAN: 37 mL/min — AB (ref >60)
Glucose, Bld: 362 mg/dL — ABNORMAL HIGH (ref 70–99)
POTASSIUM: 4.3 mmol/L (ref 3.5–5.1)
SODIUM: 139 mmol/L (ref 135–145)

## 2018-09-04 LAB — GLUCOSE, CAPILLARY
GLUCOSE-CAPILLARY: 212 mg/dL — AB (ref 70–99)
GLUCOSE-CAPILLARY: 246 mg/dL — AB (ref 70–99)
Glucose-Capillary: 354 mg/dL — ABNORMAL HIGH (ref 70–99)
Glucose-Capillary: 323 mg/dL — ABNORMAL HIGH (ref 70–99)

## 2018-09-04 LAB — URINE CULTURE
CULTURE: NO GROWTH
Special Requests: NORMAL

## 2018-09-04 LAB — TROPONIN I

## 2018-09-04 LAB — CBC
HEMATOCRIT: 41.9 % (ref 36.0–46.0)
HEMOGLOBIN: 13.6 g/dL (ref 12.0–15.0)
MCH: 30.8 pg (ref 26.0–34.0)
MCHC: 32.5 g/dL (ref 30.0–36.0)
MCV: 95 fL (ref 78.0–100.0)
Platelets: 91 10*3/uL — ABNORMAL LOW (ref 150–400)
RBC: 4.41 MIL/uL (ref 3.87–5.11)
RDW: 15.2 % (ref 11.5–15.5)
WBC: 7.5 10*3/uL (ref 4.0–10.5)

## 2018-09-04 LAB — MAGNESIUM: MAGNESIUM: 2.2 mg/dL (ref 1.7–2.4)

## 2018-09-04 MED ORDER — PREDNISONE 20 MG PO TABS
60.0000 mg | ORAL_TABLET | Freq: Every day | ORAL | Status: DC
  Administered 2018-09-05: 60 mg via ORAL
  Filled 2018-09-04: qty 3

## 2018-09-04 MED ORDER — BISACODYL 10 MG RE SUPP
10.0000 mg | Freq: Once | RECTAL | Status: AC
  Administered 2018-09-04: 10 mg via RECTAL
  Filled 2018-09-04: qty 1

## 2018-09-04 MED ORDER — TORSEMIDE 20 MG PO TABS
120.0000 mg | ORAL_TABLET | Freq: Every day | ORAL | Status: DC
  Administered 2018-09-04 – 2018-09-05 (×2): 120 mg via ORAL
  Filled 2018-09-04 (×2): qty 1

## 2018-09-04 MED ORDER — HYDRALAZINE HCL 20 MG/ML IJ SOLN: 5.0000 mg | Freq: Four times a day (QID) | INTRAMUSCULAR | Status: DC | PRN

## 2018-09-04 MED ORDER — AMLODIPINE BESYLATE 5 MG PO TABS
5.0000 mg | ORAL_TABLET | Freq: Every day | ORAL | Status: DC
  Administered 2018-09-04 – 2018-09-05 (×2): 5 mg via ORAL
  Filled 2018-09-04 (×2): qty 1

## 2018-09-04 MED ORDER — METHYLPREDNISOLONE SODIUM SUCC 125 MG IJ SOLR
60.0000 mg | Freq: Two times a day (BID) | INTRAMUSCULAR | Status: AC
  Administered 2018-09-04: 60 mg via INTRAVENOUS
  Filled 2018-09-04: qty 2

## 2018-09-04 MED ORDER — GI COCKTAIL ~~LOC~~
30.0000 mL | Freq: Once | ORAL | Status: AC
  Administered 2018-09-04: 30 mL via ORAL
  Filled 2018-09-04: qty 30

## 2018-09-04 MED ORDER — INSULIN ASPART 100 UNIT/ML ~~LOC~~ SOLN
6.0000 [IU] | Freq: Three times a day (TID) | SUBCUTANEOUS | Status: DC
  Administered 2018-09-04 – 2018-09-05 (×4): 6 [IU] via SUBCUTANEOUS

## 2018-09-04 NOTE — Progress Notes (Signed)
PROGRESS NOTE  Sabrina Small TSV:[id number] DOB: 01/27/1933 DOA: 09/01/2018 PCP: [facility]   Brief History: 84 year old female with a history of diabetes mellitus, hypothyroidism, OSA, hyperlipidemia, coronary disease, ITP, lymphedema of the lower extremities presenting with 2-week history of shortness of breath, wheezing, and hypoglycemia. On the evening of 09/01/2018, the patient accidentally took 53 units of NovoLog instead of her 53 units of Levemir. This is actually what prompted the patient to seek medical care because of concerns of hypoglycemia. During evaluation in the emergency department, it was revealed that the patient has been having shortness of breath and wheezing for the past 2 weeks. She complains of a nonproductive cough but denies any fevers, chills, nausea, vomiting, diarrhea. She states that she has gainednearly 20 pounds in the past 2 weeks. She complains of increasing abdominal girth. The patient quit smoking approximately 20 years ago with approximately 20-pack-year history. In the emergency department, the patient was noted to be afebrile hemodynamically stable. She was noted to have oxygen saturations of 90% on room air. The patient was placed on supplemental oxygen and started on IV Solu-Medrol.  Assessment/Plan: Acute respiratory failure with hypoxia -Multifactorial including CHF, COPD exacerbation in the setting of OSA/OHS -Wean oxygen for saturation greater 92% -Presently stable on 3 L -Personally reviewed chest x-ray--increased interstitial markings, chronicRLLopacity  Acute diastolic CHF -08/28/3006 Myoview--negative with EF 79% -Echocardiogram--EF 60-65%, no WMA, G1DD -Daily weights -continue IV furosemide100 mg bid>>>po torsemide 120 mg daily  COPD exacerbation -Continue Pulmicort -continue duo nebs -continue IV Solu-Medrol>>>prednisone  Diabetes mellitus type 2 -07/20/2018 hemoglobin A 1C--7.7 -restart  levemir, reduced dose -increase to resistant sliding scale -add novolog 6 units with meals  Hypertension -increase metoprolol succinate to 50 mg daily -add amlodpine  Hypoglycemia -Secondary to accidental NovoLog administration -Discontinue D10 -resolved -At this point, the patient's IV steroids elevate her CBGs -add novolog 5 units with meals  Hypothyroidism -Continue Synthroid  Depression/anxiety -Continue Cymbalta  Constipation -Restart Linzess  ITP -hold ASA for now  Abdominal pain -acute abdominal series  Chest pain -improved with GI cocktail -troponins x 2    Disposition Plan:ALF 09/05/18 if stable Family Communication:Daughter updated at bedside--Total time spent 35 minutes.  Greater than 50% spent face to face counseling and coordinating care.    Consultants:none  Code Status: FULL   DVT Prophylaxis: SCDs   Procedures: As Listed in Progress Note Above  Antibiotics: None      Subjective: Pt c/o abd pain.  Had BM yesterday.  Breathing better still dyspnea with exertion.  Denies n/v/d.  Had chest pain this am.  No f/c, n/v/d.  No dysuria  Objective: Vitals:   09/04/18 0851 09/04/18 0900 09/04/18 0953 09/04/18 1012  BP:  (!) 185/92 (!) 193/100 (!) 217/96  Pulse:  64 73   Resp:  14    Temp: 98 F (36.7 C)     TempSrc:      SpO2:  93%    Weight:      Height:        Intake/Output Summary (Last 24 hours) at 09/04/2018 1038 Last data filed at 09/04/2018 0054 Gross per 24 hour  Intake -  Output 1500 ml  Net -1500 ml   Weight change: -3 kg Exam:   General:  Pt is alert, follows commands appropriately, not in acute distress  HEENT: No icterus, No thrush, No neck mass, [place]/AT  Cardiovascular: RRR, S1/S2, no rubs, no gallops  Respiratory: bibasilar  rales, minimal basilar wheeze  Abdomen: Soft/+BS, non tender, non distended, no guarding  Extremities: trace LE edema, No lymphangitis, No petechiae, No rashes,  no synovitis   Data Reviewed: I have personally reviewed following labs and imaging studies Basic Metabolic Panel: Recent Labs  Lab 09/02/18 0010 09/03/18 0445 09/04/18 0432  NA 138 139 139  K 3.4* 3.8 4.3  CL 108 100 100  CO2 22 25 27   GLUCOSE 111* 370* 362*  BUN 21 28* 47*  CREATININE 1.04* 1.22* 1.48*  CALCIUM 8.8* 9.0 9.0  MG 1.8 1.7 2.2  PHOS 3.2  --   --    Liver Function Tests: Recent Labs  Lab 09/02/18 0010  AST 36  ALT 30  ALKPHOS 114  BILITOT 0.7  PROT 6.2*  ALBUMIN 3.7   No results for input(s): LIPASE, AMYLASE in the last 168 hours. No results for input(s): AMMONIA in the last 168 hours. Coagulation Profile: No results for input(s): INR, PROTIME in the last 168 hours. CBC: Recent Labs  Lab 09/02/18 0010 09/04/18 0432  WBC 6.9 7.5  NEUTROABS 4.2  --   HGB 12.9 13.6  HCT 39.1 41.9  MCV 95.1 95.0  PLT 74* 91*   Cardiac Enzymes: Recent Labs  Lab 09/02/18 0010  TROPONINI <0.03   BNP: Invalid input(s): POCBNP CBG: Recent Labs  Lab 09/03/18 0820 09/03/18 1134 09/03/18 1647 09/03/18 2151 09/04/18 0809  GLUCAP 354* 395* 268* 296* 354*   HbA1C: No results for input(s): HGBA1C in the last 72 hours. Urine analysis:    Component Value Date/Time   COLORURINE YELLOW 09/02/2018 0041   APPEARANCEUR CLEAR 09/02/2018 0041   LABSPEC 1.006 09/02/2018 0041   PHURINE 5.0 09/02/2018 0041   GLUCOSEU 50 (A) 09/02/2018 0041   HGBUR NEGATIVE 09/02/2018 0041   BILIRUBINUR NEGATIVE 09/02/2018 0041   KETONESUR NEGATIVE 09/02/2018 0041   PROTEINUR 100 (A) 09/02/2018 0041   NITRITE NEGATIVE 09/02/2018 0041   LEUKOCYTESUR MODERATE (A) 09/02/2018 0041   Sepsis Labs: @LABRCNTIP (procalcitonin:4,lacticidven:4) ) Recent Results (from the past 240 hour(s))  Urine culture     Status: None   Collection Time: 09/02/18 12:47 AM  Result Value Ref Range Status   Specimen Description   Final    URINE, CLEAN CATCH Performed at [facility], [address] [place], [place] [address]    Special Requests   Final    Normal Performed at [facility], [address], [place], [place] [address]    Culture   Final    NO GROWTH Performed at [facility], [place] [address], [place], [place] [address]    Report Status 09/04/2018 FINAL  Final  MRSA PCR Screening     Status: None   Collection Time: 09/02/18  5:00 AM  Result Value Ref Range Status   MRSA by PCR NEGATIVE NEGATIVE Final    Comment:        The GeneXpert MRSA Assay (FDA approved for NASAL specimens only), is one component of a comprehensive MRSA colonization surveillance program. It is not intended to diagnose MRSA infection nor to guide or monitor treatment for MRSA infections. Performed at [facility], [address], [place], [place] [address]      Scheduled Meds: . allopurinol  100 mg Oral Daily  . amLODipine  5 mg Oral Daily  . budesonide (PULMICORT) nebulizer solution  0.5 mg Nebulization BID  . buPROPion  100 mg Oral q morning - 10a  . insulin aspart  0-20 Units Subcutaneous TID WC  .  insulin aspart  0-5 Units Subcutaneous QHS  . insulin detemir  25 Units Subcutaneous Daily  . ipratropium-albuterol  3 mL Nebulization BID  . levothyroxine  75 mcg Oral QAC breakfast  . linaclotide  145 mcg Oral QAC breakfast  . methylPREDNISolone (SOLU-MEDROL) injection  60 mg Intravenous Q12H  . metoprolol succinate  50 mg Oral Daily  . pantoprazole  40 mg Oral Daily  . pregabalin  150 mg Oral BID  . torsemide  120 mg Oral Daily   Continuous Infusions:  Procedures/Studies: Dg Chest 1 View  Result Date: 09/02/2018 CLINICAL DATA:  84 year old female with shortness of breath. EXAM: CHEST  1 VIEW COMPARISON:  None. FINDINGS: Shallow inspiration. No focal consolidation, pleural effusion or pneumothorax. Mild cardiomegaly with mild vascular congestion. No acute osseous pathology. IMPRESSION: Cardiomegaly with mild vascular congestion.  No focal consolidation. Electronically Signed    By: Anner Crete M.D.   On: 09/02/2018 00:16    Orson Eva, DO  [facility] Pager [phone]  If 7PM-7AM, please contact night-coverage [email or web address] Password [email or web address] 09/04/2018, 10:38 AM   LOS: 1 day

## 2018-09-04 NOTE — Progress Notes (Signed)
At around 0930, patient complained of 8/10 mid (center) chest and abdominal pain. Patient denied any shortness of breath, vomiting or pain radiating to the arms, neck or jaw. Patient stated the pain felt like a cramping and that she felt a little nauseous.  Cardiac rhythm ran on EKG machine and printed out. Dr. Carles Collet made aware. Patient remained alert and oriented x4. Dr. Carles Collet in to assess and new orders placed and completed. See MAR. Patient expressed relief after PRN Zofran and GI cocktail given. Patient later got up to the bedside commode and reclining chair and stated she felt comfortable and denied any pain or discomfort.

## 2018-09-04 NOTE — Care Management Important Message (Signed)
Important Message  Patient Details  Name: Sabrina Small MRN: [id number] Date of Birth: June 14, 1933   Medicare Important Message Given:  Yes    Shelda Altes 09/04/2018, 11:48 AM

## 2018-09-04 NOTE — Clinical Social Work Note (Signed)
Clinical Social Work Assessment  Patient Details  Name: Sabrina Small MRN: [id number] Date of Birth: 1933/03/13  Date of referral:  09/04/18               Reason for consult:  Discharge Planning                Permission sought to share information with:    Permission granted to share information::     Name::        Agency::     Relationship::     Contact Information:     Housing/Transportation Living arrangements for the past 2 months:  [place] of Information:  Facility Patient Interpreter Needed:  None Criminal Activity/Legal Involvement Pertinent to Current Situation/Hospitalization:  No - Comment as needed Significant Relationships:  Adult Children, Other Family Members Lives with:  Facility Resident Do you feel safe going back to the place where you live?  Yes Need for family participation in patient care:  No (Coment)  Care giving concerns: Pt resides at an ALF.   Social Worker assessment / plan: Pt is an 84 year old female admitted from [place]. Spoke with [facility] at the ALF today. Per [facility] Latino, pt is very independent at the facility. They provide her medications and meals but pt does all her own other ADLs. Pt was apparently out with friends for the evening when she had to come to the ED due to blood sugar issues. The facility states that family will need to transport at dc. Horris Latino states that if pt needs O2 at dc, there is no problem with that. Will follow and assist as needed with dc planning.  Employment status:  Retired Forensic scientist:  Medicare PT Recommendations:  Not assessed at this time Information / Referral to community resources:     Patient/Family's Response to care: Pt accepting of care.  Patient/Family's Understanding of and Emotional Response to Diagnosis, Current Treatment, and Prognosis: Pt appears to have good understanding of diagnosis and treatment recommendations. No emotional distress identified.  Emotional  Assessment Appearance:  Appears stated age Attitude/Demeanor/Rapport:  Engaged Affect (typically observed):  Calm, Pleasant Orientation:  Oriented to Self, Oriented to Place, Oriented to  Time, Oriented to Situation Alcohol / Substance use:  Not Applicable Psych involvement (Current and /or in the community):  No (Comment)  Discharge Needs  Concerns to be addressed:  Discharge Planning Concerns Readmission within the last 30 days:  No Current discharge risk:  None Barriers to Discharge:  No Barriers Identified   Shade Flood, LCSW 09/04/2018, 12:25 PM

## 2018-09-04 NOTE — NC FL2 (Signed)
[place] LEVEL OF CARE SCREENING TOOL     IDENTIFICATION  Patient Name: Sabrina Small Birthdate: September 10, 1933 Sex: female Admission Date (Current Location): 09/01/2018  [facility] [place] and [place] Number:  [employer] and Address:  [place] [address] [place]      Provider Number: [id number]  Attending Physician Name and Address:  Orson Eva, MD  Relative Name and Phone Number:       Current Level of Care: Hospital Recommended Level of Care: [place] Prior Approval Number:    Date Approved/Denied:   PASRR Number:    Discharge Plan: Other (Comment)    Current Diagnoses: Patient Active Problem List   Diagnosis Date Noted  . Abdominal distension   . COPD (chronic obstructive pulmonary disease) ([place]) 09/03/2018  . Hypoglycemia 09/02/2018  . Hyperlipidemia 09/02/2018  . COPD with acute exacerbation ([place]) 09/02/2018  . Tremor of unknown origin 09/02/2018  . Acute diastolic CHF (congestive heart failure) ([place]) 09/02/2018  . Acute respiratory failure with hypoxia ([place]) 09/02/2018  . Insulin overdose   . Venous ulcer of ankle, right ([place]) 07/16/2018  . Chronic venous insufficiency 07/16/2018  . Lymphedema 07/16/2018  . Hyperlipidemia due to type 2 diabetes mellitus ([place]) 09/06/2017  . Chronic ITP (idiopathic thrombocytopenia) ([facility]) 03/22/2017  . Type 2 diabetes mellitus with diabetic foot deformity ([place]) 03/08/2017  . Gout 03/08/2017  . Hypothyroidism 03/08/2017  . Sepsis ([place]) 11/24/2016    Orientation RESPIRATION BLADDER Height & Weight     Self, Time, Situation, Place  O2(see dc summary) Continent Weight: 166 lb 10.7 oz (75.6 kg) Height:  5' (152.4 cm)  BEHAVIORAL SYMPTOMS/MOOD NEUROLOGICAL BOWEL NUTRITION STATUS      Continent Diet(see dc summary)  AMBULATORY STATUS COMMUNICATION OF NEEDS Skin   Independent Verbally Normal                       Personal Care Assistance Level of  Assistance  Bathing, Feeding, Dressing Bathing Assistance: Independent Feeding assistance: Independent Dressing Assistance: Independent     Functional Limitations Info  Sight, Hearing, Speech Sight Info: Adequate Hearing Info: Adequate Speech Info: Adequate    SPECIAL CARE FACTORS FREQUENCY                       Contractures Contractures Info: Not present    Additional Factors Info  Code Status, Allergies Code Status Info: full Allergies Info: Doxycycline, Dust mite mixed allergen, Glipizide, Iodinated Diagnostic Agents, Iodine, Liraglutide, Lortab, Morphine and related, Omnicef, Penicillins, Percocet, Rosuvastatin, Sulfa Antibiotics, Sulfamethoxazole-trimehoprim, Slufasalazine, trimethoprim, Trovaflxacin, Voltaren, Zolpidem           Current Medications (09/04/2018):  This is the current hospital active medication list Current Facility-Administered Medications  Medication Dose Route Frequency Provider Last Rate Last Dose  . acetaminophen (TYLENOL) tablet 650 mg  650 mg Oral Q6H PRN Reubin Milan, MD   650 mg at 09/04/18 6734   Or  . acetaminophen (TYLENOL) suppository 650 mg  650 mg Rectal Q6H PRN Reubin Milan, MD      . allopurinol (ZYLOPRIM) tablet 100 mg  100 mg Oral Daily Tat, Shanon Brow, MD   100 mg at 09/04/18 1013  . amLODipine (NORVASC) tablet 5 mg  5 mg Oral Daily Tat, David, MD   5 mg at 09/04/18 1012  . budesonide (PULMICORT) nebulizer solution 0.5 mg  0.5 mg Nebulization BID Tat, David, MD   0.5 mg at  09/04/18 0851  . buPROPion [facility]) tablet 100 mg  100 mg Oral q morning - 10a Tat, Shanon Brow, MD   100 mg at 09/04/18 1013  . hydrALAZINE (APRESOLINE) injection 5 mg  5 mg Intravenous Q6H PRN Tat, Shanon Brow, MD      . hydrOXYzine (ATARAX/VISTARIL) tablet 25 mg  25 mg Oral Q6H PRN Tat, David, MD      . insulin aspart (novoLOG) injection 0-20 Units  0-20 Units Subcutaneous TID Carlyn Reichert Orson Eva, MD   20 Units at 09/04/18 [phone]  . insulin aspart (novoLOG)  injection 0-5 Units  0-5 Units Subcutaneous Benay Pike, MD   3 Units at 09/03/18 2154  . insulin aspart (novoLOG) injection 6 Units  6 Units Subcutaneous TID WC Tat, David, MD      . insulin detemir (LEVEMIR) injection 25 Units  25 Units Subcutaneous Daily Tat, David, MD   25 Units at 09/04/18 1012  . ipratropium-albuterol (DUONEB) 0.5-2.5 (3) MG/3ML nebulizer solution 3 mL  3 mL Nebulization Q4H PRN Reubin Milan, MD      . ipratropium-albuterol (DUONEB) 0.5-2.5 (3) MG/3ML nebulizer solution 3 mL  3 mL Nebulization BID Tat, Shanon Brow, MD   3 mL at 09/04/18 0851  . levothyroxine (SYNTHROID, LEVOTHROID) tablet 75 mcg  75 mcg Oral QAC breakfast Tat, Shanon Brow, MD   75 mcg at 09/04/18 0636  . linaclotide (LINZESS) capsule 145 mcg  145 mcg Oral QAC breakfast Orson Eva, MD   145 mcg at 09/04/18 0803  . methylPREDNISolone sodium succinate (SOLU-MEDROL) 125 mg/2 mL injection 60 mg  60 mg Intravenous Q12H Tat, Shanon Brow, MD      . metoprolol succinate (TOPROL-XL) 24 hr tablet 50 mg  50 mg Oral Daily Tat, David, MD   50 mg at 09/04/18 0953  . ondansetron (ZOFRAN) tablet 4 mg  4 mg Oral Q6H PRN Reubin Milan, MD       Or  . ondansetron [facility]) injection 4 mg  4 mg Intravenous Q6H PRN Reubin Milan, MD   4 mg at 09/04/18 0948  . pantoprazole (PROTONIX) EC tablet 40 mg  40 mg Oral Daily Tat, David, MD   40 mg at 09/04/18 0953  . [START ON 09/05/2018] predniSONE (DELTASONE) tablet 60 mg  60 mg Oral Q breakfast Tat, David, MD      . pregabalin (LYRICA) capsule 150 mg  150 mg Oral BID Tat, Shanon Brow, MD   150 mg at 09/04/18 1014  . torsemide (DEMADEX) tablet 120 mg  120 mg Oral Daily Tat, David, MD      . traMADol Veat[facility]bon) tablet 50 mg  50 mg Oral Q6H PRN Orson Eva, MD   50 mg at 09/03/18 2205     Discharge Medications: Please see discharge summary for a list of discharge medications.  Relevant Imaging Results:  Relevant Lab Results:   Additional Information    Shade Flood, LCSW

## 2018-09-05 LAB — BASIC METABOLIC PANEL
Anion gap: 11 (ref 5–15)
BUN: 57 mg/dL — AB (ref 8–23)
CALCIUM: 8.7 mg/dL — AB (ref 8.9–10.3)
CO2: 29 mmol/L (ref 22–32)
CREATININE: 1.45 mg/dL — AB (ref 0.44–1.00)
Chloride: 99 mmol/L (ref 98–111)
GFR calc Af Amer: 37 mL/min — ABNORMAL LOW (ref >60)
GFR, EST NON AFRICAN AMERICAN: 32 mL/min — AB (ref >60)
GLUCOSE: 224 mg/dL — AB (ref 70–99)
Potassium: 3.8 mmol/L (ref 3.5–5.1)
Sodium: 139 mmol/L (ref 135–145)

## 2018-09-05 LAB — GLUCOSE, CAPILLARY
GLUCOSE-CAPILLARY: 361 mg/dL — AB (ref 70–99)
Glucose-Capillary: 237 mg/dL — ABNORMAL HIGH (ref 70–99)

## 2018-09-05 LAB — MAGNESIUM: Magnesium: 2 mg/dL (ref 1.7–2.4)

## 2018-09-05 MED ORDER — POTASSIUM CHLORIDE CRYS ER 20 MEQ PO TBCR
20.0000 meq | EXTENDED_RELEASE_TABLET | Freq: Once | ORAL | Status: AC
  Administered 2018-09-05: 20 meq via ORAL
  Filled 2018-09-05: qty 1

## 2018-09-05 MED ORDER — TORSEMIDE 20 MG PO TABS: 120.0000 mg | ORAL_TABLET | Freq: Every day | ORAL | 1 refills | Status: AC

## 2018-09-05 MED ORDER — TRAMADOL-ACETAMINOPHEN 37.5-325 MG PO TABS: 1.0000 | ORAL_TABLET | Freq: Two times a day (BID) | ORAL | 0 refills | Status: DC | PRN

## 2018-09-05 MED ORDER — LINACLOTIDE 145 MCG PO CAPS: 145.0000 ug | ORAL_CAPSULE | Freq: Every day | ORAL | 0 refills | Status: AC

## 2018-09-05 MED ORDER — METOPROLOL SUCCINATE ER 50 MG PO TB24: 50.0000 mg | ORAL_TABLET | Freq: Every day | ORAL | 1 refills | Status: AC

## 2018-09-05 MED ORDER — PREDNISONE 10 MG PO TABS: 60.0000 mg | ORAL_TABLET | Freq: Every day | ORAL | 0 refills | Status: DC

## 2018-09-05 MED ORDER — BUDESONIDE-FORMOTEROL FUMARATE 80-4.5 MCG/ACT IN AERO: 2.0000 | INHALATION_SPRAY | Freq: Two times a day (BID) | RESPIRATORY_TRACT | 1 refills | Status: DC

## 2018-09-05 MED ORDER — PREGABALIN 150 MG PO CAPS: 150.0000 mg | ORAL_CAPSULE | Freq: Every day | ORAL | Status: AC

## 2018-09-05 MED ORDER — AMLODIPINE BESYLATE 5 MG PO TABS: 5.0000 mg | ORAL_TABLET | Freq: Every day | ORAL | 1 refills | Status: DC

## 2018-09-05 NOTE — Clinical Social Work Note (Signed)
Pt stable for dc back to her ALF today per MD. Damaris Schooner with [facility] at the ALF to update. They use [facility] for [place]. RN CM to arrange [facility] PT for pt. Attempted to notify pt's daughter, Caryl Pina, by phone of pt's dc though she was unavailable and her voicemail box was full. Spoke with pt's daughter, [name], by phone to update. Per [name], her sister in in court today and can't answer her phone. They do not know when she will be done at the court. [name] lives in [place]. Explained that Caryl Pina can come pick up pt whenever she gets out of court. [name] stated she would text her sister to let her know she needs to do that.   DC clinical sent to the ALF. Updated pt's RN. There are no other CSW needs for dc.

## 2018-09-05 NOTE — Progress Notes (Signed)
Pt discharged home. PIV removed. No complications. Discharge instructions given to pt and daughter including medications and follow-up appointments. All questions answered. Understanding verbalized. Pt taken out with all belongings via [place] by RN.

## 2018-09-05 NOTE — Care Management Note (Signed)
Case Management Note  Patient Details  Name: Sabrina Small MRN: [id number] Date of Birth: 1933/05/25  Subjective/Objective:                    Action/Plan: DC back to ALF with [facility] PT. Per CSW, Home place ALF would like [facility]. Tim of [facility] given referral and will obtain via Epic.    Expected Discharge Date:  09/05/18               Expected Discharge Plan:  Assisted Living / Rest Home(with HH PT)  In-House Referral:  Clinical Social Work  Discharge planning Services  CM Consult  Post Acute Care Choice:  Home Health Choice offered to:  [facility] POA / Guardian  DME Arranged:    DME Agency:     HH Arranged:  PT [place]:  [facility] (now [facility])  Status of Service:  Completed, signed off  If discussed at [employer], dates discussed:    Additional Comments:  Kaston Faughn, Chauncey [place], RN 09/05/2018, 11:10 AM

## 2018-09-05 NOTE — NC FL2 (Signed)
[place] LEVEL OF CARE SCREENING TOOL     IDENTIFICATION  Patient Name: Sabrina Small Birthdate: 1933/09/30 Sex: female Admission Date (Current Location): 09/01/2018  [facility] and [place] Number:  [employer] and Address:  [facility] [address] [place]      Provider Number: [id number]  Attending Physician Name and Address:  Orson Eva, MD  Relative Name and Phone Number:       Current Level of Care: Hospital Recommended Level of Care: [place] Prior Approval Number:    Date Approved/Denied:   PASRR Number:    Discharge Plan: Other (Comment)    Current Diagnoses: Patient Active Problem List   Diagnosis Date Noted  . Abdominal distension   . COPD (chronic obstructive pulmonary disease) ([place]) 09/03/2018  . Hypoglycemia 09/02/2018  . Hyperlipidemia 09/02/2018  . COPD with acute exacerbation ([place]) 09/02/2018  . Tremor of unknown origin 09/02/2018  . Acute diastolic CHF (congestive heart failure) ([place]) 09/02/2018  . Acute respiratory failure with hypoxia ([place]) 09/02/2018  . Insulin overdose   . Venous ulcer of ankle, right ([place]) 07/16/2018  . Chronic venous insufficiency 07/16/2018  . Lymphedema 07/16/2018  . Hyperlipidemia due to type 2 diabetes mellitus ([place]) 09/06/2017  . Chronic ITP (idiopathic thrombocytopenia) ([facility]) 03/22/2017  . Type 2 diabetes mellitus with diabetic foot deformity ([place]) 03/08/2017  . Gout 03/08/2017  . Hypothyroidism 03/08/2017  . Sepsis ([place]) 11/24/2016    Orientation RESPIRATION BLADDER Height & Weight     Self, Time, Situation, Place  O2(see dc summary) Continent Weight: 171 lb 11.8 oz (77.9 kg) Height:  5' (152.4 cm)  BEHAVIORAL SYMPTOMS/MOOD NEUROLOGICAL BOWEL NUTRITION STATUS      Continent Diet(see dc summary)  AMBULATORY STATUS COMMUNICATION OF NEEDS Skin   Independent Verbally Normal                       Personal Care Assistance Level of  Assistance  Bathing, Feeding, Dressing Bathing Assistance: Independent Feeding assistance: Independent Dressing Assistance: Independent     Functional Limitations Info  Sight, Hearing, Speech Sight Info: Adequate Hearing Info: Adequate Speech Info: Adequate    SPECIAL CARE FACTORS FREQUENCY                       Contractures Contractures Info: Not present    Additional Factors Info  Code Status, Allergies Code Status Info: full Allergies Info: Doxycycline, Dust mite mixed allergen, Glipizide, Iodinated Diagnostic Agents, Iodine, Liraglutide, Lortab, Morphine and related, Omnicef, Penicillins, Percocet, Rosuvastatin, Sulfa Antibiotics, Sulfamethoxazole-trimehoprim, Slufasalazine, trimethoprim, Trovaflxacin, Voltaren, Zolpidem           Current Medications (09/05/2018):  This is the current hospital active medication list Current Facility-Administered Medications  Medication Dose Route Frequency Provider Last Rate Last Dose  . acetaminophen (TYLENOL) tablet 650 mg  650 mg Oral Q6H PRN Reubin Milan, MD   650 mg at 09/04/18 9811   Or  . acetaminophen (TYLENOL) suppository 650 mg  650 mg Rectal Q6H PRN Reubin Milan, MD      . allopurinol (ZYLOPRIM) tablet 100 mg  100 mg Oral Daily Tat, Shanon Brow, MD   100 mg at 09/05/18 0910  . amLODipine (NORVASC) tablet 5 mg  5 mg Oral Daily Tat, David, MD   5 mg at 09/05/18 0910  . budesonide (PULMICORT) nebulizer solution 0.5 mg  0.5 mg Nebulization BID Tat, David, MD   0.5 mg at  09/05/18 0801  . buPROPion [facility]) tablet 100 mg  100 mg Oral q morning - 10a Tat, Shanon Brow, MD   100 mg at 09/05/18 0910  . hydrALAZINE (APRESOLINE) injection 5 mg  5 mg Intravenous Q6H PRN Tat, David, MD      . hydrOXYzine (ATARAX/VISTARIL) tablet 25 mg  25 mg Oral Q6H PRN Orson Eva, MD   25 mg at 09/04/18 2208  . insulin aspart (novoLOG) injection 0-20 Units  0-20 Units Subcutaneous TID WC Orson Eva, MD   7 Units at 09/05/18 0915  . insulin  aspart (novoLOG) injection 0-5 Units  0-5 Units Subcutaneous Benay Pike, MD   2 Units at 09/04/18 2211  . insulin aspart (novoLOG) injection 6 Units  6 Units Subcutaneous TID WC Orson Eva, MD   6 Units at 09/05/18 0915  . insulin detemir (LEVEMIR) injection 25 Units  25 Units Subcutaneous Daily Tat, Shanon Brow, MD   25 Units at 09/05/18 0915  . ipratropium-albuterol (DUONEB) 0.5-2.5 (3) MG/3ML nebulizer solution 3 mL  3 mL Nebulization Q4H PRN Reubin Milan, MD      . ipratropium-albuterol (DUONEB) 0.5-2.5 (3) MG/3ML nebulizer solution 3 mL  3 mL Nebulization BID Tat, David, MD   3 mL at 09/05/18 0801  . levothyroxine (SYNTHROID, LEVOTHROID) tablet 75 mcg  75 mcg Oral QAC breakfast Tat, Shanon Brow, MD   75 mcg at 09/05/18 0557  . linaclotide (LINZESS) capsule 145 mcg  145 mcg Oral QAC breakfast Orson Eva, MD   145 mcg at 09/05/18 0910  . metoprolol succinate (TOPROL-XL) 24 hr tablet 50 mg  50 mg Oral Daily Tat, David, MD   50 mg at 09/05/18 0910  . ondansetron (ZOFRAN) tablet 4 mg  4 mg Oral Q6H PRN Reubin Milan, MD       Or  . ondansetron [facility]) injection 4 mg  4 mg Intravenous Q6H PRN Reubin Milan, MD   4 mg at 09/04/18 0948  . pantoprazole (PROTONIX) EC tablet 40 mg  40 mg Oral Daily Tat, David, MD   40 mg at 09/05/18 0910  . predniSONE (DELTASONE) tablet 60 mg  60 mg Oral Q breakfast Tat, Shanon Brow, MD   60 mg at 09/05/18 0910  . pregabalin (LYRICA) capsule 150 mg  150 mg Oral BID Orson Eva, MD   150 mg at 09/05/18 0910  . torsemide (DEMADEX) tablet 120 mg  120 mg Oral Daily Tat, David, MD   120 mg at 09/05/18 0910  . traMADol (ULTRAM) tablet 50 mg  50 mg Oral Q6H PRN Tat, Shanon Brow, MD   50 mg at 09/04/18 2208     Discharge Medications:  STOP taking these medications   FLUTICASONE PROPIONATE (INHAL) IN   meloxicam 15 MG tablet Commonly known as:  MOBIC     TAKE these medications   allopurinol 100 MG tablet Commonly known as:  ZYLOPRIM allopurinol 100 mg tablet  Take  1 tablet every day by oral route.   amLODipine 5 MG tablet Commonly known as:  NORVASC Take 1 tablet (5 mg total) by mouth daily.   aspirin EC 81 MG tablet Take 81 mg by mouth daily.   BENTYL 10 MG capsule Generic drug:  dicyclomine Take 10 mg by mouth as needed for spasms.   Biotin 1000 MCG tablet Take 1,000 mcg by mouth daily.   budesonide-formoterol 80-4.5 MCG/ACT inhaler Commonly known as:  SYMBICORT Inhale 2 puffs into the lungs 2 (two) times daily.   buPROPion 150 MG 24  hr tablet Commonly known as:  WELLBUTRIN XL Take 150 mg by mouth daily.   WELLBUTRIN PO Take 50 mg by mouth at bedtime.   DULoxetine 60 MG capsule Commonly known as:  CYMBALTA Take 60 mg by mouth daily.   fenofibrate micronized 200 MG capsule Commonly known as:  LOFIBRA Take 200 mg by mouth daily.   Fish Oil 1000 MG Caps Take 1,000 mg by mouth 4 (four) times daily.   fluticasone 50 MCG/ACT nasal spray Commonly known as:  FLONASE Place 1 spray into both nostrils daily.   hydrOXYzine 25 MG tablet Commonly known as:  ATARAX/VISTARIL Take 25 mg by mouth 3 (three) times daily as needed.   insulin aspart 100 UNIT/ML injection Commonly known as:  novoLOG Inject 2 Units into the skin 3 (three) times daily before meals. As needed if BG >200   LEVEMIR FLEXTOUCH 100 UNIT/ML Pen Generic drug:  Insulin Detemir Inject 42 Units into the skin at bedtime.   levothyroxine 50 MCG tablet Commonly known as:  SYNTHROID, LEVOTHROID Take 75 mcg by mouth daily before breakfast.   linaclotide 145 MCG Caps capsule Commonly known as:  LINZESS Take 1 capsule (145 mcg total) by mouth daily before breakfast. What changed:    medication strength  how much to take  when to take this   LIVALO 2 MG Tabs Generic drug:  Pitavastatin Calcium Take 2 mg by mouth every evening.   metoprolol succinate 50 MG 24 hr tablet Commonly known as:  TOPROL-XL Take 1 tablet (50 mg total) by mouth daily.  Take with or immediately following a meal.   omeprazole 20 MG capsule Commonly known as:  PRILOSEC Take 20 mg by mouth daily.   predniSONE 10 MG tablet Commonly known as:  DELTASONE Take 6 tablets (60 mg total) by mouth daily with breakfast. And decrease by one tablet daily   pregabalin 150 MG capsule Commonly known as:  LYRICA Take 1 capsule (150 mg total) by mouth daily. What changed:  when to take this   torsemide 20 MG tablet Commonly known as:  DEMADEX Take 6 tablets (120 mg total) by mouth daily. What changed:    medication strength  how much to take  when to take this   traMADol-acetaminophen 37.5-325 MG tablet Commonly known as:  ULTRACET Take 1 tablet by mouth 2 (two) times daily as needed for moderate pain. At 2 pm and 8 pm   vitamin B-12 1000 MCG tablet Commonly known as:  CYANOCOBALAMIN Take 1,000 mcg by mouth daily.   Vitamin D (Ergocalciferol) 50000 units Caps capsule Commonly known as:  DRISDOL Take 50,000 Units by mouth every 30 (thirty) days.   Vitamin D3 1000 units Caps Take by mouth.   Vitamins/Minerals Tabs Take by mouth.   XIIDRA 5 % Soln Generic drug:  Lifitegrast Apply 1 drop to eye 2 (two) times daily.        Relevant Imaging Results:  Relevant Lab Results:   Additional Information    Shade Flood, LCSW

## 2018-09-05 NOTE — Evaluation (Signed)
Physical Therapy Evaluation Patient Details Name: Sabrina Small MRN: [id number] DOB: [birth year] Today's Date: 09/05/2018   History of Present Illness  HPI: Sabrina Small is a 84 y.o. female with medical history significant of allergy, arthritis, Bell's palsy, colon polyps, depression, type 2 diabetes, diverticulosis, gout, hypothyroidism, sleep apnea who is coming to the emergency department with complaints of mistakenly being given 53 units of NovoLog instead of her usual 13 units of Levemir at 2100.  She was having snacks and sweet drinks prior to coming to the ER.  Her lowest level was 90 mg/dL, per patient's friend.  The patient also complains of having a tremor for the past 2 months or so.  She denies fever, chills, sore throat, dyspnea, hemoptysis, chest pain, palpitations, dizziness, diaphoresis, PND or orthopnea.  She has chronic lower extremity edema.  Denies abdominal pain, nausea, emesis, diarrhea, melena or hematochezia.  She gets frequent constipation.  She denies dysuria, frequency or hematuria.  No polyuria, polydipsia, polyphagia or blurred vision.  No heat or cold intolerance.    Clinical Impression  Patient raised head of bed for sitting up and states she uses a hospital bed with head raised at ALF, demonstrates good return for transferring to commode and chair using her single point cane, ambulated in hallway using cane with step through pattern while on room air with O2 saturations starting at 92%, between 91-95% during ambulation and at 94-96% seated after returning to room.  Patient to be discharged home today.  Plan:  Patient discharged from physical therapy to care of nursing for ambulation daily as tolerated for length of stay with recommendation below.    Follow Up Recommendations Home health PT    Equipment Recommendations  None recommended by PT    Recommendations for Other Services       Precautions / Restrictions Precautions Precautions:  None Restrictions Weight Bearing Restrictions: No      Mobility  Bed Mobility Overal bed mobility: Modified Independent             General bed mobility comments: head of bed raised  Transfers Overall transfer level: Modified independent Equipment used: Straight cane                Ambulation/Gait Ambulation/Gait assistance: Supervision Gait Distance (Feet): 100 Feet Assistive device: Straight cane Gait Pattern/deviations: Step-through pattern;WFL(Within Functional Limits) Gait velocity: decreased   General Gait Details: grossly WFL except slower than normal cadence without loss of balance  Stairs            Wheelchair Mobility    Modified Rankin (Stroke Patients Only)       Balance Overall balance assessment: Mild deficits observed, not formally tested                                           Pertinent Vitals/Pain Pain Assessment: No/denies pain    Home Living Family/patient expects to be discharged to:: Assisted living               Home Equipment: Kasandra Knudsen - single point;Walker - 2 wheels      Prior Function Level of Independence: Independent with assistive device(s)         Comments: household ambulator with [facility]     Hand Dominance        Extremity/Trunk Assessment   Upper Extremity Assessment Upper Extremity Assessment: Overall WFL for tasks  assessed    Lower Extremity Assessment Lower Extremity Assessment: Overall WFL for tasks assessed    Cervical / Trunk Assessment Cervical / Trunk Assessment: Normal  Communication   Communication: No difficulties  Cognition Arousal/Alertness: Awake/alert Behavior During Therapy: WFL for tasks assessed/performed Overall Cognitive Status: Within Functional Limits for tasks assessed                                        General Comments      Exercises     Assessment/Plan    PT Assessment All further PT needs can be met in the next venue  of care  PT Problem List Decreased activity tolerance;Decreased mobility;Decreased balance       PT Treatment Interventions      PT Goals (Current goals can be found in the Care Plan section)  Acute Rehab PT Goals Patient Stated Goal: return home PT Goal Formulation: With patient Time For Goal Achievement: 09/05/18 Potential to Achieve Goals: Good    Frequency     Barriers to discharge        Co-evaluation               AM-PAC PT "6 Clicks" Daily Activity  Outcome Measure Difficulty turning over in bed (including adjusting bedclothes, sheets and blankets)?: None Difficulty moving from lying on back to sitting on the side of the bed? : None Difficulty sitting down on and standing up from a chair with arms (e.g., wheelchair, bedside commode, etc,.)?: None Help needed moving to and from a bed to chair (including a wheelchair)?: None Help needed walking in hospital room?: None Help needed climbing 3-5 steps with a railing? : A Little 6 Click Score: 23    End of Session   Activity Tolerance: Patient tolerated treatment well;Patient limited by fatigue Patient left: in chair;with call bell/phone within reach Nurse Communication: Mobility status PT Visit Diagnosis: Unsteadiness on feet (R26.81);Other abnormalities of gait and mobility (R26.89);Muscle weakness (generalized) (M62.81)    Time: 1833-5825 PT Time Calculation (min) (ACUTE ONLY): 32 min   Charges:   PT Evaluation $PT Eval Moderate Complexity: 1 Mod PT Treatments $Therapeutic Activity: 23-37 mins        2:10 PM, 09/05/18 Lonell Grandchild, MPT Physical Therapist with [facility] [phone] office [phone] mobile phone

## 2018-09-05 NOTE — Discharge Summary (Signed)
Physician Discharge Summary  Sabrina Small MWN:[id number] DOB: [birth year] DOA: 09/01/2018  PCP: Orvis Brill, [facility]  Admit date: 09/01/2018 Discharge date: 09/05/2018  Admitted From: ALF Disposition:  ALF  Recommendations for Outpatient Follow-up:  1. Follow up with PCP in 1-2 weeks 2. Please obtain BMP/CBC in one week   Home Health:YES Equipment/Devices: HHPT  Discharge Condition: Stable CODE STATUS: FULL Diet recommendation: Heart Healthy / Carb Modified   Brief/Interim Summary: 84 year old female with a history of diabetes mellitus, hypothyroidism, OSA, hyperlipidemia, coronary disease, ITP, lymphedema of the lower extremities presenting with 2-week history of shortness of breath, wheezing, and hypoglycemia. On the evening of 09/01/2018, the patient accidentally took 53 units of NovoLog instead of her 53 units of Levemir. This is actually what prompted the patient to seek medical care because of concerns of hypoglycemia. During evaluation in the emergency department, it was revealed that the patient has been having shortness of breath and wheezing for the past 2 weeks. She complains of a nonproductive cough but denies any fevers, chills, nausea, vomiting, diarrhea. She states that she has gainednearly 20 pounds in the past 2 weeks. She complains of increasing abdominal girth. The patient quit smoking approximately 20 years ago with approximately 20-pack-year history. In the emergency department, the patient was noted to be afebrile hemodynamically stable. She was noted to have oxygen saturations of 90% on room air. The patient was placed on supplemental oxygen and started on IVSolu-Medrol and IV lasix with clinical improvement.   Discharge Diagnoses:  Acute respiratory failure with hypoxia -Multifactorial including CHF, COPD exacerbation in the setting of OSA/OHS -Wean oxygen for saturation greater 92% -Presently stable on3L>>>weaned to RA -Personally reviewed  chest x-ray--increased interstitial markings, chronicRLLopacity  Acute diastolic CHF -03/08/4741 Myoview--negative with EF 79% -Echocardiogram--EF 60-65%, no WMA, G1DD -Daily weights--NEG 6.6 lbs -continueIV furosemide100 mg bid>>>po torsemide 120 mg daily -discharge weight 171lbs.  COPD exacerbation -ContinuePulmicort -continueduo nebs -continueIV Solu-Medrol>>>prednisone taper -d/c home with symbicort  Diabetes mellitus type 2 -07/20/2018 hemoglobin A 1C--7.7 -restart levemir, reduced dose-->d/c with home dose -increase to resistant sliding scale -add novolog 6 units with meals during hospitalization  Hypertension -increase metoprolol succinate to 50 mg daily -add amlodpine 5 mg daily  Hypoglycemia -Secondary to accidental NovoLog administration -Discontinue D10 -resolved -At this point, the patient's IV steroids elevate her CBGs -add novolog 5 units with meals  Hypothyroidism -Continue Synthroid  Depression/anxiety -Continue Cymbalta  Constipation -Restart Linzess 145 mg daily  ITP -hold ASA for now  Abdominal pain/distension -acute abdominal series--moderated distension of colon -bisacodyl suppository x 1-->BM-->improved  Chest pain -improved with GI cocktail -troponins x 2 -echo as above     Discharge Instructions   Allergies as of 09/05/2018      Reactions   Doxycycline    Patient cannot recall details of reaction   Dust Mite Mixed Allergen Ext [mite (d. Farinae)]    Glipizide    Iodinated Diagnostic Agents    Iodine Other (See Comments)   Liraglutide Other (See Comments)   Lortab [hydrocodone-acetaminophen]    Morphine And Related    Omnicef [cefdinir] Other (See Comments)   Patient cannot recall details of reaction   Other    Cat dander   Penicillins Other (See Comments)   Has patient had a PCN reaction causing immediate rash, facial/tongue/throat swelling, SOB or lightheadedness with hypotension: Unknown Has  patient had a PCN reaction causing severe rash involving mucus membranes or skin necrosis: Unknown Has patient had a PCN reaction that required hospitalization: Unknown  Has patient had a PCN reaction occurring within the last 10 years: No If all of the above answers are "NO", then may proceed with Cephalosporin use. "I blow up and pass out" Over 10 yrs ago.   Percocet [oxycodone-acetaminophen] Other (See Comments)   Rosuvastatin    Sulfa Antibiotics Other (See Comments)   Patient cannot recall details of reaction   Sulfamethoxazole-trimethoprim    Other reaction(s): Unknown Other reaction(s): Unknown   Sulfasalazine    Other reaction(s): Other (See Comments) Patient cannot recall details of reaction   Trimethoprim    Trovafloxacin    Patient cannot recall details of reaction   Voltaren [diclofenac Sodium] Other (See Comments)   Zolpidem       Medication List    STOP taking these medications   FLUTICASONE PROPIONATE (INHAL) IN   meloxicam 15 MG tablet Commonly known as:  MOBIC     TAKE these medications   allopurinol 100 MG tablet Commonly known as:  ZYLOPRIM allopurinol 100 mg tablet  Take 1 tablet every day by oral route.   amLODipine 5 MG tablet Commonly known as:  NORVASC Take 1 tablet (5 mg total) by mouth daily.   aspirin EC 81 MG tablet Take 81 mg by mouth daily.   BENTYL 10 MG capsule Generic drug:  dicyclomine Take 10 mg by mouth as needed for spasms.   Biotin 1000 MCG tablet Take 1,000 mcg by mouth daily.   budesonide-formoterol 80-4.5 MCG/ACT inhaler Commonly known as:  SYMBICORT Inhale 2 puffs into the lungs 2 (two) times daily.   buPROPion 150 MG 24 hr tablet Commonly known as:  WELLBUTRIN XL Take 150 mg by mouth daily.   WELLBUTRIN PO Take 50 mg by mouth at bedtime.   DULoxetine 60 MG capsule Commonly known as:  CYMBALTA Take 60 mg by mouth daily.   fenofibrate micronized 200 MG capsule Commonly known as:  LOFIBRA Take 200 mg by mouth  daily.   Fish Oil 1000 MG Caps Take 1,000 mg by mouth 4 (four) times daily.   fluticasone 50 MCG/ACT nasal spray Commonly known as:  FLONASE Place 1 spray into both nostrils daily.   hydrOXYzine 25 MG tablet Commonly known as:  ATARAX/VISTARIL Take 25 mg by mouth 3 (three) times daily as needed.   insulin aspart 100 UNIT/ML injection Commonly known as:  novoLOG Inject 2 Units into the skin 3 (three) times daily before meals. As needed if BG >200   LEVEMIR FLEXTOUCH 100 UNIT/ML Pen Generic drug:  Insulin Detemir Inject 42 Units into the skin at bedtime.   levothyroxine 50 MCG tablet Commonly known as:  SYNTHROID, LEVOTHROID Take 75 mcg by mouth daily before breakfast.   linaclotide 145 MCG Caps capsule Commonly known as:  LINZESS Take 1 capsule (145 mcg total) by mouth daily before breakfast. What changed:    medication strength  how much to take  when to take this   LIVALO 2 MG Tabs Generic drug:  Pitavastatin Calcium Take 2 mg by mouth every evening.   metoprolol succinate 50 MG 24 hr tablet Commonly known as:  TOPROL-XL Take 1 tablet (50 mg total) by mouth daily. Take with or immediately following a meal.   omeprazole 20 MG capsule Commonly known as:  PRILOSEC Take 20 mg by mouth daily.   predniSONE 10 MG tablet Commonly known as:  DELTASONE Take 6 tablets (60 mg total) by mouth daily with breakfast. And decrease by one tablet daily   pregabalin 150 MG capsule Commonly  known as:  LYRICA Take 1 capsule (150 mg total) by mouth daily. What changed:  when to take this   torsemide 20 MG tablet Commonly known as:  DEMADEX Take 6 tablets (120 mg total) by mouth daily. What changed:    medication strength  how much to take  when to take this   traMADol-acetaminophen 37.5-325 MG tablet Commonly known as:  ULTRACET Take 1 tablet by mouth 2 (two) times daily as needed for moderate pain. At 2 pm and 8 pm   vitamin B-12 1000 MCG tablet Commonly known as:   CYANOCOBALAMIN Take 1,000 mcg by mouth daily.   Vitamin D (Ergocalciferol) 50000 units Caps capsule Commonly known as:  DRISDOL Take 50,000 Units by mouth every 30 (thirty) days.   Vitamin D3 1000 units Caps Take by mouth.   Vitamins/Minerals Tabs Take by mouth.   XIIDRA 5 % Soln Generic drug:  Lifitegrast Apply 1 drop to eye 2 (two) times daily.       Allergies  Allergen Reactions  . Doxycycline     Patient cannot recall details of reaction  . Dust Mite Mixed Allergen Ext [Mite (D. Farinae)]   . Glipizide   . Iodinated Diagnostic Agents   . Iodine Other (See Comments)  . Liraglutide Other (See Comments)  . Lortab [Hydrocodone-Acetaminophen]   . Morphine And Related   . Omnicef [Cefdinir] Other (See Comments)    Patient cannot recall details of reaction  . Other     Cat dander  . Penicillins Other (See Comments)    Has patient had a PCN reaction causing immediate rash, facial/tongue/throat swelling, SOB or lightheadedness with hypotension: Unknown Has patient had a PCN reaction causing severe rash involving mucus membranes or skin necrosis: Unknown Has patient had a PCN reaction that required hospitalization: Unknown Has patient had a PCN reaction occurring within the last 10 years: No If all of the above answers are "NO", then may proceed with Cephalosporin use. "I blow up and pass out" Over 10 yrs ago.  Marland Kitchen Percocet [Oxycodone-Acetaminophen] Other (See Comments)  . Rosuvastatin   . Sulfa Antibiotics Other (See Comments)    Patient cannot recall details of reaction  . Sulfamethoxazole-Trimethoprim     Other reaction(s): Unknown Other reaction(s): Unknown   . Sulfasalazine     Other reaction(s): Other (See Comments) Patient cannot recall details of reaction  . Trimethoprim   . Trovafloxacin     Patient cannot recall details of reaction  . Voltaren [Diclofenac Sodium] Other (See Comments)  . Zolpidem     Consultations:  none   Procedures/Studies: Dg  Chest 1 View  Result Date: 09/02/2018 CLINICAL DATA:  84 year old female with shortness of breath. EXAM: CHEST  1 VIEW COMPARISON:  None. FINDINGS: Shallow inspiration. No focal consolidation, pleural effusion or pneumothorax. Mild cardiomegaly with mild vascular congestion. No acute osseous pathology. IMPRESSION: Cardiomegaly with mild vascular congestion.  No focal consolidation. Electronically Signed   By: Anner Crete M.D.   On: 09/02/2018 00:16   Dg Abd Acute W/chest  Result Date: 09/04/2018 CLINICAL DATA:  Abdominal discomfort and distention. History of diabetes. EXAM: DG ABDOMEN ACUTE W/ 1V CHEST COMPARISON:  One view chest 09/01/2018. Abdominopelvic CT 05/30/2018. FINDINGS: Stable mild cardiomegaly and aortic atherosclerosis. The lungs are clear. There is stable mild blunting of both costophrenic angles without definite pleural effusion. Abdominal radiographs are limited by body habitus. There is moderate diffuse bowel distension which appears to predominantly reflect the colon and stomach. There are few scattered  air-fluid levels on the erect examination. No evidence of free intraperitoneal air. Cholecystectomy clips, a thoracolumbar scoliosis and spondylosis are noted. IMPRESSION: 1. Moderate diffuse bowel distention, primarily the colon and stomach. Distribution favors an ileus. 2. No acute chest findings.  Stable cardiomegaly. 3. Radiographic follow-up or CT recommended if the patient has obstructive symptoms. Electronically Signed   By: Richardean Sale M.D.   On: 09/04/2018 13:57         Discharge Exam: Vitals:   09/05/18 0600 09/05/18 0801  BP: (!) 143/83   Pulse: (!) 59   Resp: 13   Temp:  97.9 F (36.6 C)  SpO2: 91% 92%   Vitals:   09/05/18 0400 09/05/18 0500 09/05/18 0600 09/05/18 0801  BP: 136/69 (!) 134/93 (!) 143/83   Pulse: 68 62 (!) 59   Resp: 10 12 13    Temp: 97.9 F (36.6 C)   97.9 F (36.6 C)  TempSrc: Oral   Axillary  SpO2: 100% 100% 91% 92%  Weight:   77.9 kg    Height:        General: Pt is alert, awake, not in acute distress Cardiovascular: RRR, S1/S2 +, no rubs, no gallops Respiratory: bibasilar crackles, no wheeze Abdominal: Soft, NT, ND, bowel sounds + Extremities: trace LE edema, no cyanosis   The results of significant diagnostics from this hospitalization (including imaging, microbiology, ancillary and laboratory) are listed below for reference.    Significant Diagnostic Studies: Dg Chest 1 View  Result Date: 09/02/2018 CLINICAL DATA:  84 year old female with shortness of breath. EXAM: CHEST  1 VIEW COMPARISON:  None. FINDINGS: Shallow inspiration. No focal consolidation, pleural effusion or pneumothorax. Mild cardiomegaly with mild vascular congestion. No acute osseous pathology. IMPRESSION: Cardiomegaly with mild vascular congestion.  No focal consolidation. Electronically Signed   By: Anner Crete M.D.   On: 09/02/2018 00:16   Dg Abd Acute W/chest  Result Date: 09/04/2018 CLINICAL DATA:  Abdominal discomfort and distention. History of diabetes. EXAM: DG ABDOMEN ACUTE W/ 1V CHEST COMPARISON:  One view chest 09/01/2018. Abdominopelvic CT 05/30/2018. FINDINGS: Stable mild cardiomegaly and aortic atherosclerosis. The lungs are clear. There is stable mild blunting of both costophrenic angles without definite pleural effusion. Abdominal radiographs are limited by body habitus. There is moderate diffuse bowel distension which appears to predominantly reflect the colon and stomach. There are few scattered air-fluid levels on the erect examination. No evidence of free intraperitoneal air. Cholecystectomy clips, a thoracolumbar scoliosis and spondylosis are noted. IMPRESSION: 1. Moderate diffuse bowel distention, primarily the colon and stomach. Distribution favors an ileus. 2. No acute chest findings.  Stable cardiomegaly. 3. Radiographic follow-up or CT recommended if the patient has obstructive symptoms. Electronically Signed   By:  Richardean Sale M.D.   On: 09/04/2018 13:57     Microbiology: Recent Results (from the past 240 hour(s))  Urine culture     Status: None   Collection Time: 09/02/18 12:47 AM  Result Value Ref Range Status   Specimen Description   Final    URINE, CLEAN CATCH Performed at [facility], [address] [place], [place] [address]    Special Requests   Final    Normal Performed at [facility], [address], [place], [place] [address]    Culture   Final    NO GROWTH Performed at [facility], [place] [address], [place], [place] [address]    Report Status 09/04/2018 FINAL  Final  MRSA PCR Screening     Status: None  Collection Time: 09/02/18  5:00 AM  Result Value Ref Range Status   MRSA by PCR NEGATIVE NEGATIVE Final    Comment:        The GeneXpert MRSA Assay (FDA approved for NASAL specimens only), is one component of a comprehensive MRSA colonization surveillance program. It is not intended to diagnose MRSA infection nor to guide or monitor treatment for MRSA infections. Performed at [facility], [address] [place], [place] [address]      Labs: Basic Metabolic Panel: Recent Labs  Lab 09/02/18 0010 09/03/18 0445 09/04/18 0432 09/05/18 0406  NA 138 139 139 139  K 3.4* 3.8 4.3 3.8  CL 108 100 100 99  CO2 22 25 27 29   GLUCOSE 111* 370* 362* 224*  BUN 21 28* 47* 57*  CREATININE 1.04* 1.22* 1.48* 1.45*  CALCIUM 8.8* 9.0 9.0 8.7*  MG 1.8 1.7 2.2 2.0  PHOS 3.2  --   --   --    Liver Function Tests: Recent Labs  Lab 09/02/18 0010  AST 36  ALT 30  ALKPHOS 114  BILITOT 0.7  PROT 6.2*  ALBUMIN 3.7   No results for input(s): LIPASE, AMYLASE in the last 168 hours. No results for input(s): AMMONIA in the last 168 hours. CBC: Recent Labs  Lab 09/02/18 0010 09/04/18 0432  WBC 6.9 7.5  NEUTROABS 4.2  --   HGB 12.9 13.6  HCT 39.1 41.9  MCV 95.1 95.0  PLT 74* 91*   Cardiac Enzymes: Recent Labs  Lab 09/02/18 0010 09/04/18 1033 09/04/18 1559    TROPONINI <0.03 <0.03 <0.03   BNP: Invalid input(s): POCBNP CBG: Recent Labs  Lab 09/04/18 0809 09/04/18 1150 09/04/18 1703 09/04/18 2102 09/05/18 0812  GLUCAP 354* 323* 212* 246* 237*    Time coordinating discharge:  36 minutes  Signed:  Orson Eva, DO [facility] Pager: [phone] 09/05/2018, 8:44 AM

## 2018-09-06 ENCOUNTER — Encounter: Payer: Self-pay | Admitting: Emergency Medicine

## 2018-09-06 ENCOUNTER — Other Ambulatory Visit: Payer: Self-pay

## 2018-09-06 DIAGNOSIS — R739 Hyperglycemia, unspecified: Secondary | ICD-10-CM | POA: Diagnosis present

## 2018-09-06 DIAGNOSIS — Z87891 Personal history of nicotine dependence: Secondary | ICD-10-CM | POA: Insufficient documentation

## 2018-09-06 DIAGNOSIS — E0965 Drug or chemical induced diabetes mellitus with hyperglycemia: Secondary | ICD-10-CM | POA: Insufficient documentation

## 2018-09-06 DIAGNOSIS — I110 Hypertensive heart disease with heart failure: Secondary | ICD-10-CM | POA: Diagnosis not present

## 2018-09-06 DIAGNOSIS — G510 Bell's palsy: Secondary | ICD-10-CM | POA: Diagnosis not present

## 2018-09-06 DIAGNOSIS — F329 Major depressive disorder, single episode, unspecified: Secondary | ICD-10-CM | POA: Diagnosis not present

## 2018-09-06 DIAGNOSIS — I5030 Unspecified diastolic (congestive) heart failure: Secondary | ICD-10-CM | POA: Diagnosis not present

## 2018-09-06 DIAGNOSIS — N390 Urinary tract infection, site not specified: Secondary | ICD-10-CM | POA: Insufficient documentation

## 2018-09-06 DIAGNOSIS — Z794 Long term (current) use of insulin: Secondary | ICD-10-CM | POA: Diagnosis not present

## 2018-09-06 DIAGNOSIS — E039 Hypothyroidism, unspecified: Secondary | ICD-10-CM | POA: Diagnosis not present

## 2018-09-06 DIAGNOSIS — Z9049 Acquired absence of other specified parts of digestive tract: Secondary | ICD-10-CM | POA: Diagnosis not present

## 2018-09-06 DIAGNOSIS — Z7982 Long term (current) use of aspirin: Secondary | ICD-10-CM | POA: Insufficient documentation

## 2018-09-06 DIAGNOSIS — J449 Chronic obstructive pulmonary disease, unspecified: Secondary | ICD-10-CM | POA: Insufficient documentation

## 2018-09-06 DIAGNOSIS — Z79899 Other long term (current) drug therapy: Secondary | ICD-10-CM | POA: Insufficient documentation

## 2018-09-06 DIAGNOSIS — E1165 Type 2 diabetes mellitus with hyperglycemia: Secondary | ICD-10-CM | POA: Insufficient documentation

## 2018-09-06 LAB — CBC
HEMATOCRIT: 44.6 % (ref 35.0–47.0)
HEMOGLOBIN: 15.4 g/dL (ref 12.0–16.0)
MCH: 32.4 pg (ref 26.0–34.0)
MCHC: 34.5 g/dL (ref 32.0–36.0)
MCV: 94.1 fL (ref 80.0–100.0)
Platelets: 107 10*3/uL — ABNORMAL LOW (ref 150–440)
RBC: 4.74 MIL/uL (ref 3.80–5.20)
RDW: 15.7 % — ABNORMAL HIGH (ref 11.5–14.5)
WBC: 6.3 10*3/uL (ref 3.6–11.0)

## 2018-09-06 LAB — BASIC METABOLIC PANEL
ANION GAP: 12 (ref 5–15)
BUN: 81 mg/dL — ABNORMAL HIGH (ref 8–23)
CO2: 29 mmol/L (ref 22–32)
Calcium: 8.6 mg/dL — ABNORMAL LOW (ref 8.9–10.3)
Chloride: 94 mmol/L — ABNORMAL LOW (ref 98–111)
Creatinine, Ser: 1.69 mg/dL — ABNORMAL HIGH (ref 0.44–1.00)
GFR calc Af Amer: 31 mL/min — ABNORMAL LOW (ref >60)
GFR, EST NON AFRICAN AMERICAN: 27 mL/min — AB (ref >60)
GLUCOSE: 436 mg/dL — AB (ref 70–99)
POTASSIUM: 3.9 mmol/L (ref 3.5–5.1)
Sodium: 135 mmol/L (ref 135–145)

## 2018-09-06 LAB — GLUCOSE, CAPILLARY: Glucose-Capillary: 427 mg/dL — ABNORMAL HIGH (ref 70–99)

## 2018-09-06 NOTE — ED Triage Notes (Addendum)
Patient ambulatory to triage with steady gait, without difficulty or distress noted; pt reports she was sent over for elevated BS; d/c from hosp yesterday at [facility] for hypoglycemia and CHF; pt denies c/o at present; family st there was a recent mix-up with her insulin after discharge that was corrected today; pt st she ate a candy bar this evening

## 2018-09-07 ENCOUNTER — Emergency Department
Admission: EM | Admit: 2018-09-07 | Discharge: 2018-09-07 | Disposition: A | Discharge status: Home / Self Care | Payer: Medicare Other | Attending: Emergency Medicine | Admitting: Emergency Medicine

## 2018-09-07 DIAGNOSIS — T380X5A Adverse effect of glucocorticoids and synthetic analogues, initial encounter: Secondary | ICD-10-CM

## 2018-09-07 DIAGNOSIS — E1165 Type 2 diabetes mellitus with hyperglycemia: Secondary | ICD-10-CM | POA: Diagnosis not present

## 2018-09-07 DIAGNOSIS — N390 Urinary tract infection, site not specified: Secondary | ICD-10-CM

## 2018-09-07 DIAGNOSIS — R739 Hyperglycemia, unspecified: Secondary | ICD-10-CM

## 2018-09-07 LAB — URINALYSIS, COMPLETE (UACMP) WITH MICROSCOPIC
Bacteria, UA: NONE SEEN
Bilirubin Urine: NEGATIVE
GLUCOSE, UA: 50 mg/dL — AB
Hgb urine dipstick: NEGATIVE
KETONES UR: NEGATIVE mg/dL
Nitrite: NEGATIVE
PH: 5 (ref 5.0–8.0)
Protein, ur: 100 mg/dL — AB
Specific Gravity, Urine: 1.011 (ref 1.005–1.030)

## 2018-09-07 LAB — GLUCOSE, CAPILLARY
Glucose-Capillary: 302 mg/dL — ABNORMAL HIGH (ref 70–99)
Glucose-Capillary: 269 mg/dL — ABNORMAL HIGH (ref 70–99)

## 2018-09-07 MED ORDER — INSULIN ASPART 100 UNIT/ML ~~LOC~~ SOLN
8.0000 [IU] | Freq: Once | SUBCUTANEOUS | Status: AC
  Administered 2018-09-07: 8 [IU] via SUBCUTANEOUS
  Filled 2018-09-07: qty 1

## 2018-09-07 MED ORDER — NITROFURANTOIN MONOHYD MACRO 100 MG PO CAPS: 100.0000 mg | ORAL_CAPSULE | Freq: Two times a day (BID) | ORAL | 0 refills | Status: AC

## 2018-09-07 MED ORDER — NITROFURANTOIN MONOHYD MACRO 100 MG PO CAPS
100.0000 mg | ORAL_CAPSULE | Freq: Once | ORAL | Status: AC
  Administered 2018-09-07: 100 mg via ORAL
  Filled 2018-09-07: qty 1

## 2018-09-07 NOTE — ED Provider Notes (Signed)
[facility] Emergency Department Provider Note   ____________________________________________   First MD Initiated Contact with Patient 09/07/18 [phone]     (approximate)  I have reviewed the triage vital signs and the nursing notes.   HISTORY  Chief Complaint Hyperglycemia    HPI Sabrina Small is a 84 y.o. female who presents to the ED from facility with a chief complaint of hyperglycemia.  Patient was discharged from any pen yesterday for hypoglycemia and COPD/CHF exacerbation.  She was discharged on prednisone.  Daughter states there was a mixup with her insulin after discharge at the facility.  Evidently that was corrected last evening.  Patient reports she ate a candy bar in route to the hospital.  Currently voicing no complaints other than feeling tired.  Denies fever, chills, chest pain, shortness of breath, abdominal pain, nausea, vomiting, diarrhea.  Daughter thought patient had a UTI in the hospital but was not aware that she was on antibiotics.   Past Medical History:  Diagnosis Date  . Allergy   . Arthritis   . Bell's palsy   . Colon polyps   . Depression   . Diabetes mellitus without complication ([place])   . Diverticulitis   . Gout   . Hypothyroidism   . Sleep apnea     Patient Active Problem List   Diagnosis Date Noted  . Abdominal distension   . COPD (chronic obstructive pulmonary disease) ([place]) 09/03/2018  . Hypoglycemia 09/02/2018  . Hyperlipidemia 09/02/2018  . COPD with acute exacerbation ([place]) 09/02/2018  . Tremor of unknown origin 09/02/2018  . Acute diastolic CHF (congestive heart failure) ([place]) 09/02/2018  . Acute respiratory failure with hypoxia ([place]) 09/02/2018  . Insulin overdose   . Venous ulcer of ankle, right ([place]) 07/16/2018  . Chronic venous insufficiency 07/16/2018  . Lymphedema 07/16/2018  . Hyperlipidemia due to type 2 diabetes mellitus ([place]) 09/06/2017  . Chronic ITP (idiopathic thrombocytopenia) ([facility])  03/22/2017  . Type 2 diabetes mellitus with diabetic foot deformity ([place]) 03/08/2017  . Gout 03/08/2017  . Hypothyroidism 03/08/2017  . Sepsis ([place]) 11/24/2016    Past Surgical History:  Procedure Laterality Date  . ABDOMINAL HYSTERECTOMY    . CHOLECYSTECTOMY    . COLONOSCOPY WITH PROPOFOL N/A 02/04/2017   Procedure: COLONOSCOPY WITH PROPOFOL;  Surgeon: Lollie Sails, MD;  Location: [facility] ENDOSCOPY;  Service: Endoscopy;  Laterality: N/A;  . EXCISION OF BREAST BIOPSY    . HERNIA REPAIR      Prior to Admission medications   Medication Sig Start Date End Date Taking? Authorizing Provider  allopurinol (ZYLOPRIM) 100 MG tablet allopurinol 100 mg tablet  Take 1 tablet every day by oral route.    [provider]  amLODipine (NORVASC) 5 MG tablet Take 1 tablet (5 mg total) by mouth daily. 09/05/18   Orson Eva, MD  aspirin EC 81 MG tablet Take 81 mg by mouth daily.    [provider]  Biotin 1000 MCG tablet Take 1,000 mcg by mouth daily.    [provider]  budesonide-formoterol (SYMBICORT) 80-4.5 MCG/ACT inhaler Inhale 2 puffs into the lungs 2 (two) times daily. 09/05/18   Orson Eva, MD  buPROPion (WELLBUTRIN XL) 150 MG 24 hr tablet Take 150 mg by mouth daily.    [provider]  buPROPion HCl (WELLBUTRIN PO) Take 50 mg by mouth at bedtime.    [provider]  Cholecalciferol (VITAMIN D3) 1000 units CAPS Take by mouth.    [provider]  dicyclomine (BENTYL) 10 MG capsule Take 10 mg by mouth as needed for spasms.    [provider]  DULoxetine (CYMBALTA) 60 MG capsule Take 60 mg by mouth daily.    [provider]  fenofibrate micronized (LOFIBRA) 200 MG capsule Take 200 mg by mouth daily.    [provider]  fluticasone (FLONASE) 50 MCG/ACT nasal spray Place 1 spray into both nostrils daily.    [provider]  hydrOXYzine (ATARAX/VISTARIL) 25 MG tablet Take 25 mg by mouth 3 (three) times daily as  needed.    [provider]  insulin aspart (NOVOLOG) 100 UNIT/ML injection Inject 2 Units into the skin 3 (three) times daily before meals. As needed if BG >200    [provider]  Insulin Detemir (LEVEMIR FLEXTOUCH) 100 UNIT/ML Pen Inject 42 Units into the skin at bedtime.     [provider]  levothyroxine (SYNTHROID, LEVOTHROID) 50 MCG tablet Take 75 mcg by mouth daily before breakfast.     [provider]  Lifitegrast (XIIDRA) 5 % SOLN Apply 1 drop to eye 2 (two) times daily.    [provider]  linaclotide Rolan Lipa) 145 MCG CAPS capsule Take 1 capsule (145 mcg total) by mouth daily before breakfast. 09/05/18   Tat, Shanon Brow, MD  metoprolol succinate (TOPROL-XL) 50 MG 24 hr tablet Take 1 tablet (50 mg total) by mouth daily. Take with or immediately following a meal. 09/05/18   Tat, Shanon Brow, MD  nitrofurantoin, macrocrystal-monohydrate, (MACROBID) 100 MG capsule Take 1 capsule (100 mg total) by mouth 2 (two) times daily for 7 days. 09/07/18 09/14/18  Paulette Blanch, MD  Omega-3 Fatty Acids (FISH OIL) 1000 MG CAPS Take 1,000 mg by mouth 4 (four) times daily.    [provider]  omeprazole (PRILOSEC) 20 MG capsule Take 20 mg by mouth daily.    [provider]  Pitavastatin Calcium (LIVALO) 2 MG TABS Take 2 mg by mouth every evening.     [provider]  predniSONE (DELTASONE) 10 MG tablet Take 6 tablets (60 mg total) by mouth daily with breakfast. And decrease by one tablet daily 09/05/18   Tat, Shanon Brow, MD  pregabalin (LYRICA) 150 MG capsule Take 1 capsule (150 mg total) by mouth daily. 09/05/18   Orson Eva, MD  torsemide (DEMADEX) 20 MG tablet Take 6 tablets (120 mg total) by mouth daily. 09/05/18   Orson Eva, MD  traMADol-acetaminophen (ULTRACET) 37.5-325 MG tablet Take 1 tablet by mouth 2 (two) times daily as needed for moderate pain. At 2 pm and 8 pm 09/05/18   Tat, Shanon Brow, MD  vitamin B-12 (CYANOCOBALAMIN) 1000 MCG tablet Take 1,000 mcg  by mouth daily.    [provider]  Vitamin D, Ergocalciferol, (DRISDOL) 50000 units CAPS capsule Take 50,000 Units by mouth every 30 (thirty) days.     [provider]  Vitamins/Minerals TABS Take by mouth.    [provider]    Allergies Doxycycline; Dust mite mixed allergen ext [mite (d. farinae)]; Glipizide; Iodinated diagnostic agents; Iodine; Liraglutide; Lortab [hydrocodone-acetaminophen]; Morphine and related; Omnicef [cefdinir]; Other; Penicillins; Percocet [oxycodone-acetaminophen]; Rosuvastatin; Sulfa antibiotics; Sulfamethoxazole-trimethoprim; Sulfasalazine; Trimethoprim; Trovafloxacin; Voltaren [diclofenac sodium]; and Zolpidem  Family History  Adopted: Yes    Social History Social History   Tobacco Use  . Smoking status: Former Smoker    Packs/day: 2.00    Years: 30.00    Pack years: 60.00    Last attempt to quit: 03/08/1985    Years since quitting: 33.5  .  Smokeless tobacco: Never Used  Substance Use Topics  . Alcohol use: No  . Drug use: No    Review of Systems  Constitutional: Positive for generalized weakness.  No fever/chills Eyes: No visual changes. ENT: No sore throat. Cardiovascular: Denies chest pain. Respiratory: Denies shortness of breath. Gastrointestinal: No abdominal pain.  No nausea, no vomiting.  No diarrhea.  No constipation. Genitourinary: Negative for dysuria. Musculoskeletal: Negative for back pain. Skin: Negative for rash. Neurological: Negative for headaches, focal weakness or numbness.   ____________________________________________   PHYSICAL EXAM:  VITAL SIGNS: ED Triage Vitals  Enc Vitals Group     BP 09/06/18 2229 (!) 158/100     Pulse Rate 09/06/18 2229 67     Resp 09/06/18 2229 18     Temp 09/06/18 2229 98.3 F (36.8 C)     Temp Source 09/06/18 2229 Oral     SpO2 09/06/18 2229 95 %     Weight 09/06/18 2230 171 lb 11.8 oz (77.9 kg)     Height 09/06/18 2230 5' (1.524 m)     Head Circumference  --      Peak Flow --      Pain Score 09/06/18 2229 0     Pain Loc --      Pain Edu? --      Excl. in [place]? --     Constitutional: Alert and oriented. Well appearing and in no acute distress. Eyes: Conjunctivae are normal. PERRL. EOMI. Head: Atraumatic. Nose: No congestion/rhinnorhea. Mouth/Throat: Mucous membranes are moist.  Oropharynx non-erythematous. Neck: No stridor.   Cardiovascular: Normal rate, regular rhythm. Grossly normal heart sounds.  Good peripheral circulation. Respiratory: Normal respiratory effort.  No retractions. Lungs CTAB. Gastrointestinal: Soft and nontender. No distention. No abdominal bruits. No CVA tenderness. Musculoskeletal: No lower extremity tenderness nor edema.  No joint effusions. Neurologic:  Normal speech and language. No gross focal neurologic deficits are appreciated. No gait instability. Skin:  Skin is warm, dry and intact. No rash noted. Psychiatric: Mood and affect are normal. Speech and behavior are normal.  ____________________________________________   LABS (all labs ordered are listed, but only abnormal results are displayed)  Labs Reviewed  BASIC METABOLIC PANEL - Abnormal; Notable for the following components:      Result Value   Chloride 94 (*)    Glucose, Bld 436 (*)    BUN 81 (*)    Creatinine, Ser 1.69 (*)    Calcium 8.6 (*)    GFR calc non Af Amer 27 (*)    GFR calc Af Amer 31 (*)    All other components within normal limits  CBC - Abnormal; Notable for the following components:   RDW 15.7 (*)    Platelets 107 (*)    All other components within normal limits  URINALYSIS, COMPLETE (UACMP) WITH MICROSCOPIC - Abnormal; Notable for the following components:   Color, Urine YELLOW (*)    APPearance HAZY (*)    Glucose, UA 50 (*)    Protein, ur 100 (*)    Leukocytes, UA LARGE (*)    All other components within normal limits  GLUCOSE, CAPILLARY - Abnormal; Notable for the following components:   Glucose-Capillary 427 (*)     All other components within normal limits  GLUCOSE, CAPILLARY - Abnormal; Notable for the following components:   Glucose-Capillary 302 (*)    All other components within normal limits  GLUCOSE, CAPILLARY - Abnormal; Notable for the following components:   Glucose-Capillary 269 (*)  All other components within normal limits  URINE CULTURE  CBG MONITORING, ED   ____________________________________________  EKG  ED ECG REPORT I, Thersa Mohiuddin J, the attending physician, personally viewed and interpreted this ECG.   Date: 09/07/2018  EKG Time: 0405  Rate: 56  Rhythm: sinus bradycardia  Axis: Normal  Intervals:none  ST&T Change: Nonspecific  ____________________________________________  RADIOLOGY  ED MD interpretation: None  Official radiology report(s): No results found.  ____________________________________________   PROCEDURES  Procedure(s) performed: None  Procedures  Critical Care performed: No  ____________________________________________   INITIAL IMPRESSION / ASSESSMENT AND PLAN / ED COURSE  As part of my medical decision making, I reviewed the following data within the electronic MEDICAL RECORD NUMBER History obtained from family, Nursing notes reviewed and incorporated, Labs reviewed, EKG interpreted, Old chart reviewed and Notes from prior ED visits   84 year old female with a history of diabetes who presents with hyperglycemia following a hospitalization; currently on prednisone.  Differential diagnosis includes but is not limited to steroid-induced hyperglycemia, infectious, metabolic etiologies, etc.  Daughter states this is happened before.  In addition there was a mixup with her insulin which is now rectified.  Laboratory results remarkable for slightly worsened renal insufficiency, hyperglycemia without elevation of anion gap.  Given her recent tenuous fluid status, will hold IV fluids.  Instead will administer subcu insulin.  Check urinalysis and  reassess.  Clinical Course as of Sep 07 617  Thu Sep 07, 2018  0802 Urinalysis noted.  Patient has many allergies and adverse reactions to medicines.  Will obtain urine culture.  Will start [place].   [JS]  0513 Repeat FSBS 269.  Will write instructions for facility to administer sliding scale insulin.  Will discharge back on Macrobid.  Urine culture is pending.  Strict return precautions given.  Patient and daughter verbalized understanding and agree with plan of care.   [JS]    Clinical Course User Index [JS] Paulette Blanch, MD     ____________________________________________   FINAL CLINICAL IMPRESSION(S) / ED DIAGNOSES  Final diagnoses:  Hyperglycemia  Steroid-induced hyperglycemia  Lower urinary tract infectious disease     ED Discharge Orders         Ordered    nitrofurantoin, macrocrystal-monohydrate, (MACROBID) 100 MG capsule  2 times daily     09/07/18 0516           Note:  This document was prepared using Dragon voice recognition software and may include unintentional dictation errors.    Paulette Blanch, MD 09/07/18 [phone]

## 2018-09-07 NOTE — Discharge Instructions (Signed)
1.  Take antibiotic as prescribed (Macrobid 100 mg twice daily x7 days). 2.  Urine culture is pending.  You will be notified of any positive results requiring a change in your antibiotic. 3.  Your blood sugar will be temporarily high because you are on the steroid and also have an infection.  Continue your current insulin regimen. 4.  Facility to cover with sliding scale regular insulin per the following instructions: BS < 60           Notify MD   BS 60-150       0 insulin BS 151-200     2 units SQ insulin BS 201-250     4 units SQ insulin BS 251-300     6 units SQ insulin BS 301-350     8 units SQ insulin BS 351-400     10 units SQ insulin BS > 400          Call MD 5.  Return to the ER for worsening symptoms, persistent vomiting, difficulty breathing or other concerns.

## 2018-09-08 LAB — URINE CULTURE: Culture: <10000 — AB

## 2018-09-22 ENCOUNTER — Ambulatory Visit
Admission: RE | Admit: 2018-09-22 | Discharge: 2018-09-22 | Disposition: A | Discharge status: Home / Self Care | Payer: Medicare Other | Source: Ambulatory Visit | Attending: Physician Assistant | Admitting: Physician Assistant

## 2018-09-22 DIAGNOSIS — Z1231 Encounter for screening mammogram for malignant neoplasm of breast: Secondary | ICD-10-CM | POA: Diagnosis present

## 2018-10-03 ENCOUNTER — Inpatient Hospital Stay: Payer: Medicare Other | Admitting: Oncology

## 2018-10-03 ENCOUNTER — Inpatient Hospital Stay: Payer: Medicare Other

## 2018-10-06 ENCOUNTER — Other Ambulatory Visit: Payer: Self-pay

## 2018-10-06 ENCOUNTER — Inpatient Hospital Stay: Payer: Medicare Other | Attending: Oncology

## 2018-10-06 ENCOUNTER — Inpatient Hospital Stay (HOSPITAL_BASED_OUTPATIENT_CLINIC_OR_DEPARTMENT_OTHER): Payer: Medicare Other | Admitting: Oncology

## 2018-10-06 ENCOUNTER — Encounter: Payer: Self-pay | Admitting: Oncology

## 2018-10-06 VITALS — BP 137/84 | HR 71 | Temp 97.9°F | Resp 18 | Ht 60.0 in | Wt 167.9 lb

## 2018-10-06 DIAGNOSIS — Z794 Long term (current) use of insulin: Secondary | ICD-10-CM

## 2018-10-06 DIAGNOSIS — E119 Type 2 diabetes mellitus without complications: Secondary | ICD-10-CM

## 2018-10-06 DIAGNOSIS — Z79899 Other long term (current) drug therapy: Secondary | ICD-10-CM

## 2018-10-06 DIAGNOSIS — D693 Immune thrombocytopenic purpura: Secondary | ICD-10-CM

## 2018-10-06 DIAGNOSIS — Z7982 Long term (current) use of aspirin: Secondary | ICD-10-CM | POA: Insufficient documentation

## 2018-10-06 DIAGNOSIS — Z87891 Personal history of nicotine dependence: Secondary | ICD-10-CM

## 2018-10-06 LAB — CBC WITH DIFFERENTIAL/PLATELET
Abs Immature Granulocytes: 0.04 10*3/uL (ref 0.00–0.07)
BASOS PCT: 1 %
Basophils Absolute: 0 10*3/uL (ref 0.0–0.1)
EOS ABS: 0.2 10*3/uL (ref 0.0–0.5)
Eosinophils Relative: 3 %
HCT: 43.3 % (ref 36.0–46.0)
Hemoglobin: 14.1 g/dL (ref 12.0–15.0)
IMMATURE GRANULOCYTES: 1 %
Lymphocytes Relative: 24 %
Lymphs Abs: 1.3 10*3/uL (ref 0.7–4.0)
MCH: 30.3 pg (ref 26.0–34.0)
MCHC: 32.6 g/dL (ref 30.0–36.0)
MCV: 92.9 fL (ref 80.0–100.0)
MONOS PCT: 8 %
Monocytes Absolute: 0.5 10*3/uL (ref 0.1–1.0)
NEUTROS PCT: 63 %
Neutro Abs: 3.6 10*3/uL (ref 1.7–7.7)
PLATELETS: 100 10*3/uL — AB (ref 150–400)
RBC: 4.66 MIL/uL (ref 3.87–5.11)
RDW: 14.7 % (ref 11.5–15.5)
WBC: 5.6 10*3/uL (ref 4.0–10.5)
nRBC: 0 % (ref 0.0–0.2)

## 2018-10-09 NOTE — Progress Notes (Signed)
Hematology/Oncology Consult note [facility]  [phone] [phone]  Patient Care Team: [facility] as PCP - General (Geriatric Medicine)   Name of the patient: Sabrina Small  [id number]  01-02-1933   Date of visit: 10/09/18  Diagnosis- thrombocytopenia likely due to ITP  Chief complaint/ Reason for visit-routine follow-up of thrombocytopenia  Heme/Onc history: patient is a 84 year old female who follows up with Dr. Gustavo Lah from [facility] for fatty liver. Recent ultrasound of the abdomen on 02/18/2017 showed prominent liver with increased liver echogenicity. These findings are indicated of hepatic steatosis. Also recent CBC from 03/02/2017 showed white count of 3.2, H&H of 13.8/38.4 and a platelet count of 98. She has been referred to [place] for evaluation and management of thrombocytopenia.Her platelet count has been ranging between 80's- 120's since December 2017. She denies any bleeding or bruising  Results of blood work from 03/08/2017 were as follows: CBC showed white count of 3.7 with a normal differential, H&H of 14.3/41.5 and a platelet count of 94. B12 and folate were within normal limits. Review of peripheral smear showed mild thrombocytopenia and normal lipid morphology.   Interval history- patient was admitted recently to the hospital for pneumonia and heart failure. She is slowly recovering from that. Denies any bleeding/ bruising  ECOG PS- 2 Pain scale- 0 Opioid associated constipation- no  Review of systems- Review of Systems  Constitutional: Positive for malaise/fatigue. Negative for chills, fever and weight loss.  HENT: Negative for congestion, ear discharge and nosebleeds.   Eyes: Negative for blurred vision.  Respiratory: Positive for shortness of breath. Negative for cough, hemoptysis, sputum production and wheezing.   Cardiovascular: Negative for chest pain, palpitations, orthopnea and  claudication.  Gastrointestinal: Negative for abdominal pain, blood in stool, constipation, diarrhea, heartburn, melena, nausea and vomiting.  Genitourinary: Negative for dysuria, flank pain, frequency, hematuria and urgency.  Musculoskeletal: Negative for back pain, joint pain and myalgias.  Skin: Negative for rash.  Neurological: Negative for dizziness, tingling, focal weakness, seizures, weakness and headaches.  Endo/Heme/Allergies: Does not bruise/bleed easily.  Psychiatric/Behavioral: Negative for depression and suicidal ideas. The patient does not have insomnia.       Allergies  Allergen Reactions  . Doxycycline     Patient cannot recall details of reaction  . Dust Mite Mixed Allergen Ext [Mite (D. Farinae)]   . Glipizide   . Iodinated Diagnostic Agents   . Iodine Other (See Comments)  . Liraglutide Other (See Comments)  . Lortab [Hydrocodone-Acetaminophen]   . Morphine And Related   . Omnicef [Cefdinir] Other (See Comments)    Patient cannot recall details of reaction  . Other     Cat dander  . Penicillins Other (See Comments)    Has patient had a PCN reaction causing immediate rash, facial/tongue/throat swelling, SOB or lightheadedness with hypotension: Unknown Has patient had a PCN reaction causing severe rash involving mucus membranes or skin necrosis: Unknown Has patient had a PCN reaction that required hospitalization: Unknown Has patient had a PCN reaction occurring within the last 10 years: No If all of the above answers are "NO", then may proceed with Cephalosporin use. "I blow up and pass out" Over 10 yrs ago.  Marland Kitchen Percocet [Oxycodone-Acetaminophen] Other (See Comments)  . Rosuvastatin   . Sulfa Antibiotics Other (See Comments)    Patient cannot recall details of reaction  . Sulfamethoxazole-Trimethoprim     Other reaction(s): Unknown Other reaction(s): Unknown   . Sulfasalazine  Other reaction(s): Other (See Comments) Patient cannot recall details of  reaction  . Trimethoprim   . Trovafloxacin     Patient cannot recall details of reaction  . Voltaren [Diclofenac Sodium] Other (See Comments)  . Zolpidem      Past Medical History:  Diagnosis Date  . Allergy   . Arthritis   . Bell's palsy   . Colon polyps   . Depression   . Diabetes mellitus without complication ([place])   . Diverticulitis   . Gout   . Hypothyroidism   . Sleep apnea      Past Surgical History:  Procedure Laterality Date  . ABDOMINAL HYSTERECTOMY    . CHOLECYSTECTOMY    . COLONOSCOPY WITH PROPOFOL N/A 02/04/2017   Procedure: COLONOSCOPY WITH PROPOFOL;  Surgeon: Lollie Sails, MD;  Location: [facility] ENDOSCOPY;  Service: Endoscopy;  Laterality: N/A;  . HERNIA REPAIR      Social History   Socioeconomic History  . Marital status: Widowed    Spouse name: Not on file  . Number of children: Not on file  . Years of education: Not on file  . Highest education level: Not on file  Occupational History  . Not on file  Social Needs  . Financial resource strain: Not on file  . Food insecurity:    Worry: Not on file    Inability: Not on file  . Transportation needs:    Medical: Not on file    Non-medical: Not on file  Tobacco Use  . Smoking status: Former Smoker    Packs/day: 2.00    Years: 30.00    Pack years: 60.00    Last attempt to quit: 03/08/1985    Years since quitting: 33.6  . Smokeless tobacco: Never Used  Substance and Sexual Activity  . Alcohol use: No  . Drug use: No  . Sexual activity: Not on file  Lifestyle  . Physical activity:    Days per week: Not on file    Minutes per session: Not on file  . Stress: Not on file  Relationships  . Social connections:    Talks on phone: Not on file    Gets together: Not on file    Attends religious service: Not on file    Active member of club or organization: Not on file    Attends meetings of clubs or organizations: Not on file    Relationship status: Not on file  . Intimate partner violence:     Fear of current or ex partner: Not on file    Emotionally abused: Not on file    Physically abused: Not on file    Forced sexual activity: Not on file  Other Topics Concern  . Not on file  Social History Narrative  . Not on file    Family History  Adopted: Yes     Current Outpatient Medications:  .  allopurinol (ZYLOPRIM) 100 MG tablet, allopurinol 100 mg tablet  Take 1 tablet every day by oral route., Disp: , Rfl:  .  amLODipine (NORVASC) 5 MG tablet, Take 1 tablet (5 mg total) by mouth daily., Disp: 30 tablet, Rfl: 1 .  aspirin EC 81 MG tablet, Take 81 mg by mouth daily., Disp: , Rfl:  .  Biotin 1000 MCG tablet, Take 1,000 mcg by mouth daily., Disp: , Rfl:  .  budesonide-formoterol (SYMBICORT) 80-4.5 MCG/ACT inhaler, Inhale 2 puffs into the lungs 2 (two) times daily., Disp: 1 Inhaler, Rfl: 1 .  buPROPion (WELLBUTRIN XL)  150 MG 24 hr tablet, Take 150 mg by mouth daily., Disp: , Rfl:  .  buPROPion HCl (WELLBUTRIN PO), Take 50 mg by mouth at bedtime., Disp: , Rfl:  .  Cholecalciferol (VITAMIN D3) 1000 units CAPS, Take by mouth., Disp: , Rfl:  .  DULoxetine (CYMBALTA) 60 MG capsule, Take 60 mg by mouth daily., Disp: , Rfl:  .  fenofibrate micronized (LOFIBRA) 200 MG capsule, Take 200 mg by mouth daily., Disp: , Rfl:  .  fluticasone (FLONASE) 50 MCG/ACT nasal spray, Place 1 spray into both nostrils daily., Disp: , Rfl:  .  insulin aspart (NOVOLOG) 100 UNIT/ML injection, Inject 2 Units into the skin 3 (three) times daily before meals. As needed if BG >200, Disp: , Rfl:  .  Insulin Detemir (LEVEMIR FLEXTOUCH) 100 UNIT/ML Pen, Inject 42 Units into the skin at bedtime. , Disp: , Rfl:  .  levothyroxine (SYNTHROID, LEVOTHROID) 50 MCG tablet, Take 75 mcg by mouth daily before breakfast. , Disp: , Rfl:  .  Lifitegrast (XIIDRA) 5 % SOLN, Apply 1 drop to eye 2 (two) times daily., Disp: , Rfl:  .  linaclotide (LINZESS) 145 MCG CAPS capsule, Take 1 capsule (145 mcg total) by mouth daily before  breakfast., Disp: 30 capsule, Rfl: 0 .  metoprolol succinate (TOPROL-XL) 50 MG 24 hr tablet, Take 1 tablet (50 mg total) by mouth daily. Take with or immediately following a meal., Disp: 30 tablet, Rfl: 1 .  Omega-3 Fatty Acids (FISH OIL) 1000 MG CAPS, Take 1,000 mg by mouth 4 (four) times daily., Disp: , Rfl:  .  omeprazole (PRILOSEC) 20 MG capsule, Take 20 mg by mouth daily., Disp: , Rfl:  .  Pitavastatin Calcium (LIVALO) 2 MG TABS, Take 2 mg by mouth every evening. , Disp: , Rfl:  .  predniSONE (DELTASONE) 10 MG tablet, Take 6 tablets (60 mg total) by mouth daily with breakfast. And decrease by one tablet daily, Disp: 21 tablet, Rfl: 0 .  pregabalin (LYRICA) 150 MG capsule, Take 1 capsule (150 mg total) by mouth daily., Disp: , Rfl:  .  torsemide (DEMADEX) 20 MG tablet, Take 6 tablets (120 mg total) by mouth daily., Disp: 180 tablet, Rfl: 1 .  traMADol-acetaminophen (ULTRACET) 37.5-325 MG tablet, Take 1 tablet by mouth 2 (two) times daily as needed for moderate pain. At 2 pm and 8 pm, Disp: 8 tablet, Rfl: 0 .  vitamin B-12 (CYANOCOBALAMIN) 1000 MCG tablet, Take 1,000 mcg by mouth daily., Disp: , Rfl:  .  Vitamin D, Ergocalciferol, (DRISDOL) 50000 units CAPS capsule, Take 50,000 Units by mouth every 30 (thirty) days. , Disp: , Rfl:  .  Vitamins/Minerals TABS, Take by mouth., Disp: , Rfl:  .  dicyclomine (BENTYL) 10 MG capsule, Take 10 mg by mouth as needed for spasms., Disp: , Rfl:  .  hydrOXYzine (ATARAX/VISTARIL) 25 MG tablet, Take 25 mg by mouth 3 (three) times daily as needed., Disp: , Rfl:   Physical exam:  Vitals:   10/06/18 1038  BP: 137/84  Pulse: 71  Resp: 18  Temp: 97.9 F (36.6 C)  TempSrc: Tympanic  SpO2: 94%  Weight: 167 lb 14.4 oz (76.2 kg)  Height: 5' (1.524 m)   Physical Exam  Constitutional: She is oriented to person, place, and time.  Elderly female in no acute distress  HENT:  Head: Normocephalic and atraumatic.  Eyes: Pupils are equal, round, and reactive to  light. EOM are normal.  Neck: Normal range of motion.  Cardiovascular: Normal  rate, regular rhythm and normal heart sounds.  Pulmonary/Chest: Effort normal and breath sounds normal.  Abdominal: Soft. Bowel sounds are normal.  Musculoskeletal: She exhibits edema.  Neurological: She is alert and oriented to person, place, and time.  Skin: Skin is warm and dry.     CMP Latest Ref Rng & Units 09/06/2018  Glucose 70 - 99 mg/dL 436(H)  BUN 8 - 23 mg/dL 81(H)  Creatinine 0.44 - 1.00 mg/dL 1.69(H)  Sodium 135 - 145 mmol/L 135  Potassium 3.5 - 5.1 mmol/L 3.9  Chloride 98 - 111 mmol/L 94(L)  CO2 22 - 32 mmol/L 29  Calcium 8.9 - 10.3 mg/dL 8.6(L)  Total Protein 6.5 - 8.1 g/dL -  Total Bilirubin 0.3 - 1.2 mg/dL -  Alkaline Phos 38 - 126 U/L -  AST 15 - 41 U/L -  ALT 0 - 44 U/L -   CBC Latest Ref Rng & Units 10/06/2018  WBC 4.0 - 10.5 K/uL 5.6  Hemoglobin 12.0 - 15.0 g/dL 14.1  Hematocrit 36.0 - 46.0 % 43.3  Platelets 150 - 400 K/uL 100(L)    No images are attached to the encounter.  Mm 3d Screen Breast Bilateral  Result Date: 09/29/2018 CLINICAL DATA:  Screening. EXAM: DIGITAL SCREENING BILATERAL MAMMOGRAM WITH TOMO AND CAD COMPARISON:  Previous exam(s). ACR Breast Density Category b: There are scattered areas of fibroglandular density. FINDINGS: There are no findings suspicious for malignancy. Images were processed with CAD. IMPRESSION: No mammographic evidence of malignancy. A result letter of this screening mammogram will be mailed directly to the patient. RECOMMENDATION: Screening mammogram in one year. (Code:SM-B-01Y) BI-RADS CATEGORY  1: Negative. Electronically Signed   By: Franki Cabot M.D.   On: 09/29/2018 08:39     Assessment and plan- Patient is a 84 y.o. female with isolated thrombocytopenia due to ITP.  Patients platelet count has remained stable in the 100's over last 2 years. No other cytopenias. This can be monitored conservatively and I will see her back in 1 years time  with cbc.     Visit Diagnosis 1. Chronic ITP (idiopathic thrombocytopenia) (HCC)      Dr. Randa Evens, MD, MPH [facility] [id number] 10/09/2018 9:16 AM

## 2018-11-09 ENCOUNTER — Encounter

## 2018-11-09 ENCOUNTER — Ambulatory Visit (INDEPENDENT_AMBULATORY_CARE_PROVIDER_SITE_OTHER): Payer: Medicare Other | Admitting: Vascular Surgery

## 2018-11-09 ENCOUNTER — Encounter (INDEPENDENT_AMBULATORY_CARE_PROVIDER_SITE_OTHER): Payer: Medicare Other

## 2018-11-22 ENCOUNTER — Other Ambulatory Visit: Payer: Self-pay

## 2018-11-22 ENCOUNTER — Ambulatory Visit (INDEPENDENT_AMBULATORY_CARE_PROVIDER_SITE_OTHER): Payer: Medicare Other | Admitting: Psychiatry

## 2018-11-22 ENCOUNTER — Encounter: Payer: Self-pay | Admitting: Psychiatry

## 2018-11-22 VITALS — BP 92/58 | HR 76 | Temp 99.1°F | Wt 165.4 lb

## 2018-11-22 DIAGNOSIS — K5900 Constipation, unspecified: Secondary | ICD-10-CM | POA: Insufficient documentation

## 2018-11-22 DIAGNOSIS — E1140 Type 2 diabetes mellitus with diabetic neuropathy, unspecified: Secondary | ICD-10-CM | POA: Insufficient documentation

## 2018-11-22 DIAGNOSIS — H04129 Dry eye syndrome of unspecified lacrimal gland: Secondary | ICD-10-CM | POA: Insufficient documentation

## 2018-11-22 DIAGNOSIS — I509 Heart failure, unspecified: Secondary | ICD-10-CM | POA: Insufficient documentation

## 2018-11-22 DIAGNOSIS — E099 Drug or chemical induced diabetes mellitus without complications: Secondary | ICD-10-CM | POA: Insufficient documentation

## 2018-11-22 DIAGNOSIS — I10 Essential (primary) hypertension: Secondary | ICD-10-CM | POA: Insufficient documentation

## 2018-11-22 DIAGNOSIS — M2010 Hallux valgus (acquired), unspecified foot: Secondary | ICD-10-CM | POA: Insufficient documentation

## 2018-11-22 DIAGNOSIS — F330 Major depressive disorder, recurrent, mild: Secondary | ICD-10-CM | POA: Diagnosis not present

## 2018-11-22 DIAGNOSIS — L304 Erythema intertrigo: Secondary | ICD-10-CM | POA: Insufficient documentation

## 2018-11-22 DIAGNOSIS — M1991 Primary osteoarthritis, unspecified site: Secondary | ICD-10-CM | POA: Insufficient documentation

## 2018-11-22 DIAGNOSIS — E669 Obesity, unspecified: Secondary | ICD-10-CM | POA: Insufficient documentation

## 2018-11-22 DIAGNOSIS — F329 Major depressive disorder, single episode, unspecified: Secondary | ICD-10-CM | POA: Insufficient documentation

## 2018-11-22 DIAGNOSIS — R932 Abnormal findings on diagnostic imaging of liver and biliary tract: Secondary | ICD-10-CM | POA: Insufficient documentation

## 2018-11-22 DIAGNOSIS — D696 Thrombocytopenia, unspecified: Secondary | ICD-10-CM | POA: Insufficient documentation

## 2018-11-22 DIAGNOSIS — F419 Anxiety disorder, unspecified: Secondary | ICD-10-CM | POA: Insufficient documentation
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
## 2018-12-13 ENCOUNTER — Ambulatory Visit (INDEPENDENT_AMBULATORY_CARE_PROVIDER_SITE_OTHER): Payer: Medicare Other | Admitting: Licensed Clinical Social Worker

## 2018-12-13 DIAGNOSIS — F330 Major depressive disorder, recurrent, mild: Secondary | ICD-10-CM | POA: Diagnosis not present

## 2018-12-13 NOTE — Progress Notes (Signed)
Comprehensive Clinical Assessment (CCA) Note  12/13/2018 Sabrina Small [id number]  Visit Diagnosis:      ICD-10-CM   1. MDD (major depressive disorder), recurrent episode, mild (HCC) F33.0       CCA Part One  Part One has been completed on paper by the patient.  (See scanned document in Chart Review)  CCA Part Two A  Intake/Chief Complaint:  CCA Intake With Chief Complaint CCA Part Two Date: 12/13/18 CCA Part Two Time: 41 Chief Complaint/Presenting Problem: I saw the Psychiatrist and she wants me to see a therapist.  My husband died about a year and a half ago.  After he died my daughter sold my house, 3 cars and placed me in a home. Individual's Strengths: making cards, computers,  Individual's Preferences: to be in my own home in my own space Individual's Abilities: communicates well  Mental Health Symptoms Depression:  Depression: Increase/decrease in appetite, Tearfulness  Mania:  Mania: N/A  Anxiety:   Anxiety: N/A  Psychosis:  Psychosis: N/A  Trauma:  Trauma: N/A  Obsessions:  Obsessions: N/A  Compulsions:  Compulsions: N/A  Inattention:  Inattention: N/A  Hyperactivity/Impulsivity:  Hyperactivity/Impulsivity: N/A  Oppositional/Defiant Behaviors:  Oppositional/Defiant Behaviors: N/A  Borderline Personality:  Emotional Irregularity: N/A  Other Mood/Personality Symptoms:      Mental Status Exam Appearance and self-care  Stature:     Weight:  Weight: Overweight  Clothing:  Clothing: Neat/clean  Grooming:  Grooming: Normal  Cosmetic use:  Cosmetic Use: None  Posture/gait:  Posture/Gait: Normal  Motor activity:  Motor Activity: Not Remarkable  Sensorium  Attention:  Attention: Normal  Concentration:  Concentration: Normal  Orientation:  Orientation: X5  Recall/memory:  Recall/Memory: Normal  Affect and Mood  Affect:  Affect: Appropriate  Mood:  Mood: Euthymic  Relating  Eye contact:  Eye Contact: Normal  Facial expression:  Facial Expression: Responsive   Attitude toward examiner:  Attitude Toward Examiner: Cooperative  Thought and Language  Speech flow: Speech Flow: Normal  Thought content:     Preoccupation:     Hallucinations:     Organization:     Transport planner of Knowledge:  Fund of Knowledge: Average  Intelligence:  Intelligence: Average  Abstraction:  Abstraction: Normal  Judgement:  Judgement: Fair  Art therapist:  Reality Testing: Adequate  Insight:  Insight: Fair  Decision Making:  Decision Making: Normal  Social Functioning  Social Maturity:  Social Maturity: Responsible  Social Judgement:  Social Judgement: Normal  Stress  Stressors:  Stressors: Transitions  Coping Ability:  Coping Ability: Normal  Skill Deficits:     Supports:      Family and Psychosocial History: Family history Marital status: Widowed Widowed, when?: year and a half ago Are you sexually active?: No What is your sexual orientation?: heterosexual Does patient have children?: Yes How many children?: 5([name] 45, Paul 57, Greg 56, Shawn 54, Angel 52) How is patient's relationship with their children?: Rpts a good relationship with her children, grandchildren and [place] Grandchildren  Childhood History:  Childhood History Additional childhood history information: Rpts that she was born in [place].  Raised by various Family members (Aunt, Cousin, [place], [place]) Description of patient's relationship with caregiver when they were a child: Mother: minimal contact; lot of broked promises Father: no contact Patient's description of current relationship with people who raised him/her: Both deceased How were you disciplined when you got in trouble as a child/adolescent?: stick from shade Does patient have siblings?: Yes Number of Siblings: 1(Olive )  Description of patient's current relationship with siblings: my sister [place] 40 yrs older; deceased Did patient suffer any verbal/emotional/physical/sexual abuse as a child?: No Did patient  suffer from severe childhood neglect?: No Has patient ever been sexually abused/assaulted/raped as an adolescent or adult?: No Was the patient ever a victim of a crime or a disaster?: No Witnessed domestic violence?: No Has patient been effected by domestic violence as an adult?: No  CCA Part Two B  Employment/Work Situation: Employment / Work Copywriter, advertising Employment situation: On disability Why is patient on disability: survivor's benefits How long has patient been on disability: 1.5 yrs What is the longest time patient has a held a job?: 50yrs Where was the patient employed at that time?: [employer]  Education: Education Name of [place]: [place] Did [employer] Graduate From [employer]?: Yes Did Physicist, medical?: (business school) Did You Have An Individualized Education Program (IIEP): No Did You Have Any Difficulty At [employer]?: No  Religion: Religion/Spirituality Are You A Religious Person?: Yes What is Your Religious Affiliation?: Catholic How Might This Affect Treatment?: denies  Leisure/Recreation: Leisure / Recreation Leisure and Hobbies: bingo, window shopping  Exercise/Diet: Exercise/Diet Do You Exercise?: Yes What Type of Exercise Do You Do?: Run/Walk How Many Times a Week Do You Exercise?: Daily Have You Gained or Lost A Significant Amount of Weight in the Past Six Months?: No Do You Follow a Special Diet?: No Do You Have Any Trouble Sleeping?: No  CCA Part Two C  Alcohol/Drug Use: Alcohol / Drug Use Pain Medications: see record Prescriptions: see record Over the Counter: see record History of alcohol / drug use?: No history of alcohol / drug abuse                      CCA Part Three  ASAM's:  Six Dimensions of Multidimensional Assessment  Dimension 1:  Acute Intoxication and/or Withdrawal Potential:     Dimension 2:  Biomedical Conditions and Complications:     Dimension 3:  Emotional, Behavioral, or  Cognitive Conditions and Complications:     Dimension 4:  Readiness to Change:     Dimension 5:  Relapse, Continued use, or Continued Problem Potential:     Dimension 6:  Recovery/Living Environment:      Substance use Disorder (SUD)    Social Function:  Social Functioning Social Maturity: Responsible Social Judgement: Normal  Stress:  Stress Stressors: Transitions Coping Ability: Normal Patient Takes Medications The Way The Doctor Instructed?: Yes Priority Risk: Low Acuity  Risk Assessment- Self-Harm Potential: Risk Assessment For Self-Harm Potential Thoughts of Self-Harm: No current thoughts Method: No plan Availability of Means: No access/NA  Risk Assessment -Dangerous to Others Potential: Risk Assessment For Dangerous to Others Potential Method: No Plan Availability of Means: No access or NA Intent: Vague intent or NA Notification Required: No need or identified person  DSM5 Diagnoses: Patient Active Problem List   Diagnosis Date Noted  . Abnormal findings on diagnostic imaging of liver and biliary tract 11/22/2018  . Anxiety 11/22/2018  . Acquired hallux valgus 11/22/2018  . Congestive heart failure ([place]) 11/22/2018  . Constipation 11/22/2018  . Diabetic neuropathy ([place]) 11/22/2018  . Drug-induced diabetes mellitus ([place]) 11/22/2018  . Essential hypertension 11/22/2018  . Obesity 11/22/2018  . Major depression, single episode 11/22/2018  . Intertrigo 11/22/2018  . Thrombocytopenia ([place]) 11/22/2018  . Tear film insufficiency 11/22/2018  . Primary osteoarthritis 11/22/2018  . Abdominal distension   .  COPD (chronic obstructive pulmonary disease) ([place]) 09/03/2018  . Hypoglycemia 09/02/2018  . Hyperlipidemia 09/02/2018  . COPD with acute exacerbation ([place]) 09/02/2018  . Tremor of unknown origin 09/02/2018  . Acute diastolic CHF (congestive heart failure) ([place]) 09/02/2018  . Acute respiratory failure with hypoxia ([place]) 09/02/2018  . Insulin overdose   . Venous  ulcer of ankle, right ([place]) 07/16/2018  . Chronic venous insufficiency 07/16/2018  . Lymphedema 07/16/2018  . Osteoarthritis of left knee 03/22/2018  . History of total knee arthroplasty 03/22/2018  . Hyperlipidemia due to type 2 diabetes mellitus ([place]) 09/06/2017  . Vitamin D deficiency 09/06/2017  . Chronic ITP (idiopathic thrombocytopenia) ([facility]) 03/22/2017  . Type 2 diabetes mellitus with diabetic foot deformity ([place]) 03/08/2017  . Gout 03/08/2017  . Hypothyroidism 03/08/2017  . Sepsis ([place]) 11/24/2016    Patient Centered Plan: Patient is on the following Treatment Plan(s):  Depression  Recommendations for Services/Supports/Treatments: Recommendations for Services/Supports/Treatments Recommendations For Services/Supports/Treatments: Medication Management, Individual Therapy  Treatment Plan Summary:    Referrals to Alternative Service(s): Referred to Alternative Service(s):   Place:   Date:   Time:    Referred to Alternative Service(s):   Place:   Date:   Time:    Referred to Alternative Service(s):   Place:   Date:   Time:    Referred to Alternative Service(s):   Place:   Date:   Time:     [place]

## 2018-12-22 ENCOUNTER — Other Ambulatory Visit: Payer: Self-pay | Admitting: Internal Medicine

## 2019-01-10 ENCOUNTER — Ambulatory Visit (INDEPENDENT_AMBULATORY_CARE_PROVIDER_SITE_OTHER): Payer: Medicare Other | Admitting: Licensed Clinical Social Worker

## 2019-01-10 DIAGNOSIS — F330 Major depressive disorder, recurrent, mild: Secondary | ICD-10-CM | POA: Diagnosis not present

## 2019-01-23 ENCOUNTER — Ambulatory Visit: Payer: Medicare Other | Admitting: Psychiatry

## 2019-01-31 ENCOUNTER — Ambulatory Visit: Payer: Medicare Other | Admitting: Licensed Clinical Social Worker

## 2019-02-02 ENCOUNTER — Emergency Department: Payer: Medicare Other

## 2019-02-02 ENCOUNTER — Ambulatory Visit: Payer: Medicare Other | Admitting: Psychiatry

## 2019-02-02 ENCOUNTER — Encounter: Payer: Self-pay | Admitting: Emergency Medicine

## 2019-02-02 ENCOUNTER — Emergency Department
Admission: EM | Admit: 2019-02-02 | Discharge: 2019-02-02 | Disposition: A | Discharge status: Home / Self Care | Payer: Medicare Other | Attending: Emergency Medicine | Admitting: Emergency Medicine

## 2019-02-02 DIAGNOSIS — W010XXA Fall on same level from slipping, tripping and stumbling without subsequent striking against object, initial encounter: Secondary | ICD-10-CM | POA: Insufficient documentation

## 2019-02-02 DIAGNOSIS — N390 Urinary tract infection, site not specified: Secondary | ICD-10-CM | POA: Diagnosis not present

## 2019-02-02 DIAGNOSIS — E860 Dehydration: Secondary | ICD-10-CM | POA: Diagnosis not present

## 2019-02-02 DIAGNOSIS — Y999 Unspecified external cause status: Secondary | ICD-10-CM | POA: Diagnosis not present

## 2019-02-02 DIAGNOSIS — Z794 Long term (current) use of insulin: Secondary | ICD-10-CM | POA: Insufficient documentation

## 2019-02-02 DIAGNOSIS — Y9389 Activity, other specified: Secondary | ICD-10-CM | POA: Diagnosis not present

## 2019-02-02 DIAGNOSIS — I509 Heart failure, unspecified: Secondary | ICD-10-CM | POA: Diagnosis not present

## 2019-02-02 DIAGNOSIS — G510 Bell's palsy: Secondary | ICD-10-CM | POA: Diagnosis not present

## 2019-02-02 DIAGNOSIS — Z87891 Personal history of nicotine dependence: Secondary | ICD-10-CM | POA: Diagnosis not present

## 2019-02-02 DIAGNOSIS — W19XXXA Unspecified fall, initial encounter: Secondary | ICD-10-CM

## 2019-02-02 DIAGNOSIS — S81811A Laceration without foreign body, right lower leg, initial encounter: Secondary | ICD-10-CM | POA: Diagnosis present

## 2019-02-02 DIAGNOSIS — K5900 Constipation, unspecified: Secondary | ICD-10-CM

## 2019-02-02 DIAGNOSIS — E1140 Type 2 diabetes mellitus with diabetic neuropathy, unspecified: Secondary | ICD-10-CM | POA: Insufficient documentation

## 2019-02-02 DIAGNOSIS — R1032 Left lower quadrant pain: Secondary | ICD-10-CM | POA: Diagnosis not present

## 2019-02-02 DIAGNOSIS — Y92009 Unspecified place in unspecified non-institutional (private) residence as the place of occurrence of the external cause: Secondary | ICD-10-CM | POA: Insufficient documentation

## 2019-02-02 DIAGNOSIS — I110 Hypertensive heart disease with heart failure: Secondary | ICD-10-CM | POA: Insufficient documentation

## 2019-02-02 LAB — CBC WITH DIFFERENTIAL/PLATELET
Abs Immature Granulocytes: 0.02 10*3/uL (ref 0.00–0.07)
BASOS ABS: 0 10*3/uL (ref 0.0–0.1)
Basophils Relative: 1 %
EOS ABS: 0.2 10*3/uL (ref 0.0–0.5)
EOS PCT: 5 %
HEMATOCRIT: 42.6 % (ref 36.0–46.0)
HEMOGLOBIN: 14 g/dL (ref 12.0–15.0)
Immature Granulocytes: 1 %
LYMPHS ABS: 1.1 10*3/uL (ref 0.7–4.0)
LYMPHS PCT: 24 %
MCH: 31.2 pg (ref 26.0–34.0)
MCHC: 32.9 g/dL (ref 30.0–36.0)
MCV: 94.9 fL (ref 80.0–100.0)
MONOS PCT: 8 %
Monocytes Absolute: 0.3 10*3/uL (ref 0.1–1.0)
Neutro Abs: 2.7 10*3/uL (ref 1.7–7.7)
Neutrophils Relative %: 61 %
Platelets: 103 10*3/uL — ABNORMAL LOW (ref 150–400)
RBC: 4.49 MIL/uL (ref 3.87–5.11)
RDW: 15.2 % (ref 11.5–15.5)
WBC: 4.4 10*3/uL (ref 4.0–10.5)
nRBC: 0 % (ref 0.0–0.2)

## 2019-02-02 LAB — URINALYSIS, COMPLETE (UACMP) WITH MICROSCOPIC
Bilirubin Urine: NEGATIVE
GLUCOSE, UA: NEGATIVE mg/dL
KETONES UR: NEGATIVE mg/dL
NITRITE: NEGATIVE
PROTEIN: 100 mg/dL — AB
Specific Gravity, Urine: 1.011 (ref 1.005–1.030)
pH: 5 (ref 5.0–8.0)

## 2019-02-02 LAB — COMPREHENSIVE METABOLIC PANEL
ALT: 23 U/L (ref 0–44)
ANION GAP: 11 (ref 5–15)
AST: 37 U/L (ref 15–41)
Albumin: 4.1 g/dL (ref 3.5–5.0)
Alkaline Phosphatase: 47 U/L (ref 38–126)
BILIRUBIN TOTAL: 0.7 mg/dL (ref 0.3–1.2)
BUN: 49 mg/dL — ABNORMAL HIGH (ref 8–23)
CHLORIDE: 101 mmol/L (ref 98–111)
CO2: 27 mmol/L (ref 22–32)
Calcium: 9.1 mg/dL (ref 8.9–10.3)
Creatinine, Ser: 1.89 mg/dL — ABNORMAL HIGH (ref 0.44–1.00)
GFR, EST AFRICAN AMERICAN: 28 mL/min — AB (ref >60)
GFR, EST NON AFRICAN AMERICAN: 24 mL/min — AB (ref >60)
Glucose, Bld: 105 mg/dL — ABNORMAL HIGH (ref 70–99)
POTASSIUM: 3.7 mmol/L (ref 3.5–5.1)
Sodium: 139 mmol/L (ref 135–145)
TOTAL PROTEIN: 6.5 g/dL (ref 6.5–8.1)

## 2019-02-02 LAB — GLUCOSE, CAPILLARY
GLUCOSE-CAPILLARY: 78 mg/dL (ref 70–99)
Glucose-Capillary: 65 mg/dL — ABNORMAL LOW (ref 70–99)
Glucose-Capillary: 98 mg/dL (ref 70–99)

## 2019-02-02 MED ORDER — FOSFOMYCIN TROMETHAMINE 3 G PO PACK
3.0000 g | PACK | Freq: Once | ORAL | Status: AC
  Administered 2019-02-02: 3 g via ORAL
  Filled 2019-02-02: qty 3

## 2019-02-02 MED ORDER — POLYETHYLENE GLYCOL 3350 17 G PO PACK: 17.0000 g | PACK | Freq: Every day | ORAL | 0 refills | Status: DC

## 2019-02-02 MED ORDER — ACETAMINOPHEN 325 MG PO TABS
650.0000 mg | ORAL_TABLET | Freq: Once | ORAL | Status: AC
  Administered 2019-02-02: 650 mg via ORAL
  Filled 2019-02-02: qty 2

## 2019-02-02 NOTE — ED Triage Notes (Addendum)
Arrives from [facility], patient had an unwitnessed fall at breakfast this morning.  EMS report that initial blood sugar 42.  D10 - 75 cc given.  Repeat CBG:  67.  Patient more awake.  skin tear to right lower leg.  Patient states she lost her balance while reaching down to get something off the floor.  Patient states she had tests scheduled for this morning, so she was not eating for the test.  Per MAR review, patient was given 18 units of Novolog at 0730.

## 2019-02-02 NOTE — ED Notes (Signed)
Return from CT scan.  AAOx3.  Skin warm and dry.  Repositioned.  C/O left lower quadrant / left lower back pain with any position change.  Once movement has stopped, pain resolved.  Breakfast tray given, patient eating tray without difficulty at this time.  Continue to monitor.

## 2019-02-02 NOTE — ED Notes (Addendum)
8 oz orange juice given.

## 2019-02-02 NOTE — ED Notes (Signed)
Patient transported to CT 

## 2019-02-02 NOTE — ED Provider Notes (Addendum)
[facility] Emergency Department Provider Note  ____________________________________________   I have reviewed the triage vital signs and the nursing notes. Where available I have reviewed prior notes and, if possible and indicated, outside hospital notes.    HISTORY  Chief Complaint No chief complaint on file.    HPI Sabrina Small is a 85 y.o. female  With a history of CHF Bell's palsy depression diabetes mellitus diverticulitis gout multiple other medical problems who has 22 different listed medical environmental intolerances allergies, presents today complaining of having fallen.  She is leaning over to pick something up and she lost her balance and fell.  EMS found her with a sugar in the 40s.  Patient was to be going to a doctor's appointment this morning because she is been having left lower quadrant abdominal pain and they are worried about constipation.  For the the visit to the doctor apparently she was kept n.p.o., however, they still gave her her morning and last night insulin.  EMS therefore shown with a sugar of 45 they did give her some glucose and is up to 65 here.  She does not have any chest pain shortness of breath or any complaints of significant trauma.  No hip pain no knee pain.  She has a old bruise to her right leg from prior fall and there is now a skin tear there which is new.  She also complains of ongoing abdominal pain "when you touch it" most on the left.  Does have a history of diverticulitis.  States she cannot have any IV or oral contrast.  Patient states that she has had no closed head injury with this fall but she does have a history of abdominal pain she is on aspirin.    Past Medical History:  Diagnosis Date  . Allergy   . Arthritis   . Bell's palsy   . CHF (congestive heart failure) ([place])   . Colon polyps   . Depression   . Diabetes mellitus without complication ([place])   . Diabetes mellitus, type II ([place])   . Diverticulitis    . Gout   . Hypothyroidism   . Sleep apnea     Patient Active Problem List   Diagnosis Date Noted  . Abnormal findings on diagnostic imaging of liver and biliary tract 11/22/2018  . Anxiety 11/22/2018  . Acquired hallux valgus 11/22/2018  . Congestive heart failure ([place]) 11/22/2018  . Constipation 11/22/2018  . Diabetic neuropathy ([place]) 11/22/2018  . Drug-induced diabetes mellitus ([place]) 11/22/2018  . Essential hypertension 11/22/2018  . Obesity 11/22/2018  . Major depression, single episode 11/22/2018  . Intertrigo 11/22/2018  . Thrombocytopenia ([place]) 11/22/2018  . Tear film insufficiency 11/22/2018  . Primary osteoarthritis 11/22/2018  . Abdominal distension   . COPD (chronic obstructive pulmonary disease) ([place]) 09/03/2018  . Hypoglycemia 09/02/2018  . Hyperlipidemia 09/02/2018  . COPD with acute exacerbation ([place]) 09/02/2018  . Tremor of unknown origin 09/02/2018  . Acute diastolic CHF (congestive heart failure) ([place]) 09/02/2018  . Acute respiratory failure with hypoxia ([place]) 09/02/2018  . Insulin overdose   . Venous ulcer of ankle, right ([place]) 07/16/2018  . Chronic venous insufficiency 07/16/2018  . Lymphedema 07/16/2018  . Osteoarthritis of left knee 03/22/2018  . History of total knee arthroplasty 03/22/2018  . Hyperlipidemia due to type 2 diabetes mellitus ([place]) 09/06/2017  . Vitamin D deficiency 09/06/2017  . Chronic ITP (idiopathic thrombocytopenia) ([facility]) 03/22/2017  . Type 2 diabetes mellitus with diabetic foot deformity ([facility])  03/08/2017  . Gout 03/08/2017  . Hypothyroidism 03/08/2017  . Sepsis ([place]) 11/24/2016    Past Surgical History:  Procedure Laterality Date  . ABDOMINAL HYSTERECTOMY    . CHOLECYSTECTOMY    . COLONOSCOPY WITH PROPOFOL N/A 02/04/2017   Procedure: COLONOSCOPY WITH PROPOFOL;  Surgeon: Lollie Sails, MD;  Location: [facility] ENDOSCOPY;  Service: Endoscopy;  Laterality: N/A;  . HERNIA REPAIR      Prior to Admission medications   Medication  Sig Start Date End Date Taking? Authorizing Provider  albuterol (PROVENTIL) (2.5 MG/3ML) 0.083% nebulizer solution 3 ml 10/16/18   [provider]  albuterol (VENTOLIN HFA) 108 (90 Base) MCG/ACT inhaler 2 puffs as needed    [provider]  allopurinol (ZYLOPRIM) 100 MG tablet allopurinol 100 mg tablet  Take 1 tablet every day by oral route.    [provider]  amLODipine (NORVASC) 5 MG tablet Take 1 tablet (5 mg total) by mouth daily. 09/05/18   Orson Eva, MD  aspirin EC 81 MG tablet Take 81 mg by mouth daily.    [provider]  BD AUTOSHIELD DUO 30G X 5 MM MISC USE WITH LEVEMIR AND NOVOLOG 12/25/18   Crecencio Mc, MD  Biotin 1000 MCG tablet Take 1,000 mcg by mouth daily.    [provider]  budesonide-formoterol (SYMBICORT) 80-4.5 MCG/ACT inhaler Inhale 2 puffs into the lungs 2 (two) times daily. 09/05/18   Orson Eva, MD  buPROPion (WELLBUTRIN XL) 150 MG 24 hr tablet Take 150 mg by mouth daily.    [provider]  Cholecalciferol (VITAMIN D3) 1000 units CAPS Take by mouth.    [provider]  dicyclomine (BENTYL) 10 MG capsule Take 10 mg by mouth as needed for spasms.    [provider]  DULoxetine (CYMBALTA) 60 MG capsule Take 60 mg by mouth daily.    [provider]  fenofibrate micronized (LOFIBRA) 200 MG capsule Take 200 mg by mouth daily.    [provider]  fluticasone (FLONASE) 50 MCG/ACT nasal spray Place 1 spray into both nostrils daily.    [provider]  fluticasone-salmeterol (ADVAIR HFA) 230-21 MCG/ACT inhaler 2 puffs    [provider]  hydrOXYzine (ATARAX/VISTARIL) 25 MG tablet Take 25 mg by mouth 3 (three) times daily as needed.    [provider]  insulin aspart (NOVOLOG) 100 UNIT/ML injection Inject 2 Units into the skin 3 (three) times daily before meals. As needed if BG >200    [provider]  Insulin Detemir (LEVEMIR FLEXTOUCH) 100 UNIT/ML Pen  Inject 42 Units into the skin at bedtime.     [provider]  levothyroxine (SYNTHROID, LEVOTHROID) 50 MCG tablet Take 75 mcg by mouth daily before breakfast.     [provider]  Lifitegrast (XIIDRA) 5 % SOLN Apply 1 drop to eye 2 (two) times daily.    [provider]  linaclotide Rolan Lipa) 145 MCG CAPS capsule Take 1 capsule (145 mcg total) by mouth daily before breakfast. 09/05/18   Tat, Shanon Brow, MD  metoprolol succinate (TOPROL-XL) 50 MG 24 hr tablet Take 1 tablet (50 mg total) by mouth daily. Take with or immediately following a meal. 09/05/18   Tat, Shanon Brow, MD  Omega-3 Fatty Acids (FISH OIL) 1000 MG CAPS Take 1,000 mg by mouth 4 (four) times daily.    [provider]  omeprazole (PRILOSEC) 20 MG capsule Take 20 mg by mouth daily.    [provider]  Pitavastatin Calcium (LIVALO) 2  MG TABS Take 2 mg by mouth every evening.     [provider]  Polyethyl Glycol-Propyl Glycol (SYSTANE) 0.4-0.3 % GEL ophthalmic gel instill 1 drop into both eyes 07/24/18   [provider]  potassium chloride (K-DUR) 10 MEQ tablet 1 tablet with food 09/07/18   [provider]  pregabalin (LYRICA) 150 MG capsule Take 1 capsule (150 mg total) by mouth daily. 09/05/18   Orson Eva, MD  torsemide (DEMADEX) 20 MG tablet Take 6 tablets (120 mg total) by mouth daily. 09/05/18   Orson Eva, MD  traMADol-acetaminophen (ULTRACET) 37.5-325 MG tablet Take 1 tablet by mouth 2 (two) times daily as needed for moderate pain. At 2 pm and 8 pm 09/05/18   Tat, Shanon Brow, MD  vitamin B-12 (CYANOCOBALAMIN) 1000 MCG tablet Take 1,000 mcg by mouth daily.    [provider]  Vitamin D, Ergocalciferol, (DRISDOL) 50000 units CAPS capsule Take 50,000 Units by mouth every 30 (thirty) days.     [provider]  Vitamins/Minerals TABS Take by mouth.    [provider]    Allergies Ciprocin-fluocin-procin  [fluocinolone]; Doxycycline; Dust mite mixed allergen  ext [mite (d. farinae)]; Glipizide; Iodinated diagnostic agents; Iodine; Liraglutide; Lortab [hydrocodone-acetaminophen]; Morphine and related; Omnicef [cefdinir]; Other; Penicillins; Percocet [oxycodone-acetaminophen]; Rosuvastatin; Sulfa antibiotics; Sulfamethoxazole-trimethoprim; Sulfasalazine; Trimethoprim; Trovafloxacin; Vancomycin; Voltaren [diclofenac sodium]; and Zolpidem  Family History  Adopted: Yes  Problem Relation Age of Onset  . Mental illness Son     Social History Social History   Tobacco Use  . Smoking status: Former Smoker    Packs/day: 2.00    Years: 30.00    Pack years: 60.00    Last attempt to quit: 03/08/1985    Years since quitting: 33.9  . Smokeless tobacco: Never Used  Substance Use Topics  . Alcohol use: No  . Drug use: No    Review of Systems Constitutional: No fever/chills Eyes: No visual changes. ENT: No sore throat. No stiff neck no neck pain Cardiovascular: Denies chest pain. Respiratory: Denies shortness of breath. Gastrointestinal:   no vomiting.  No diarrhea.  No constipation. Genitourinary: Negative for dysuria. Musculoskeletal: Negative lower extremity swelling Skin: Negative for rash. Neurological: Negative for severe headaches, focal weakness or numbness.   ____________________________________________   PHYSICAL EXAM:  VITAL SIGNS: ED Triage Vitals  Enc Vitals Group     BP 02/02/19 0949 (!) 158/70     Pulse Rate 02/02/19 0949 60     Resp 02/02/19 0949 16     Temp 02/02/19 0949 97.8 F (36.6 C)     Temp src --      SpO2 02/02/19 0949 91 %     Weight 02/02/19 0945 165 lb 5.5 oz (75 kg)     Height --      Head Circumference --      Peak Flow --      Pain Score --      Pain Loc --      Pain Edu? --      Excl. in [place]? --     Constitutional: Alert and oriented. Well appearing and in no acute distress.  Daughter states she is at her baseline in terms of her mentation Eyes: Conjunctivae are normal Head: Atraumatic HEENT: No  congestion/rhinnorhea. Mucous membranes are moist.  Oropharynx non-erythematous Neck:   Nontender with no meningismus, no masses, no stridor Cardiovascular: Normal rate, regular rhythm. Grossly normal heart sounds.  Good peripheral circulation. Respiratory: Normal respiratory effort.  No retractions. Lungs CTAB.  Abdominal: Soft and positive left-sided abdominal discomfort. No distention. No guarding no rebound Back:  There is no focal tenderness or step off.  there is no midline tenderness there are no lesions noted. there is is positive left mild CVA tenderness Musculoskeletal: No lower extremity tenderness, no upper extremity tenderness. No joint effusions, no DVT signs strong distal pulses no edema Neurologic:  Normal speech and language. No gross focal neurologic deficits are appreciated.  Skin:  Skin is warm, dry and intact.  There is a palm sized superficial skin tear with missing skin to the right pretibial region.  No bony tenderness, no evidence of infection.  There is old bruising noted around this area as well. Psychiatric: Mood and affect are normal. Speech and behavior are normal.  ____________________________________________   LABS (all labs ordered are listed, but only abnormal results are displayed)  Labs Reviewed  GLUCOSE, CAPILLARY - Abnormal; Notable for the following components:      Result Value   Glucose-Capillary 65 (*)    All other components within normal limits  CBC WITH DIFFERENTIAL/PLATELET  URINALYSIS, COMPLETE (UACMP) WITH MICROSCOPIC  COMPREHENSIVE METABOLIC PANEL    Pertinent labs  results that were available during my care of the patient were reviewed by me and considered in my medical decision making (see chart for details). ____________________________________________  EKG  I personally interpreted any EKGs ordered by me or triage  ____________________________________________  RADIOLOGY  Pertinent labs & imaging results that were available  during my care of the patient were reviewed by me and considered in my medical decision making (see chart for details). If possible, patient and/or family made aware of any abnormal findings.  No results found. ____________________________________________    PROCEDURES  Procedure(s) performed: None  Procedures  Critical Care performed: None  ____________________________________________   INITIAL IMPRESSION / ASSESSMENT AND PLAN / ED COURSE  Pertinent labs & imaging results that were available during my care of the patient were reviewed by me and considered in my medical decision making (see chart for details).  With a non-syncopal fall here today.  Several different issues are to be addressed.  First, fall.  Patient was hypoglycemic because she had had nothing to eat but had insulin.  She feels fine and her sugar is correcting we are going to feed her.  That is likely why she fell.  T\wo, the reason she was going to the doctor's office today was because she was having abdominal pain for the last couple days.  She is tender, history of diverticulitis, cannot have IV contrast, we will obtain a noncontrasted CT scan to see if there is kidney stone issues, we will check a urine and we will check basic blood work.  3, patient has a skin tear but no other obvious injury I do not think imaging is indicated but because of her age and the mechanism I will obtain CT head.  No evidence of hip or extremity fracture however.  I can fully range her hips bilaterally.  ----------------------------------------- 11:32 AM on 02/02/2019 -----------------------------------------  In no acute distress, family's concerns about constipation appear to be borne out by CT scan no other pathology noted them to my read there is significant constipation or least a significant stool burden.  We can certainly treat this orally at home there is nothing to suggest disimpaction is indicated.      ____________________________________________   FINAL CLINICAL IMPRESSION(S) / ED DIAGNOSES  Final diagnoses:  None      This chart was  dictated using voice recognition software.  Despite best efforts to proofread,  errors can occur which can change meaning.      Schuyler Amor, MD 02/02/19 1011    Schuyler Amor, MD 02/02/19 [phone]

## 2019-02-02 NOTE — ED Notes (Signed)
Report to [place]

## 2019-02-03 LAB — URINE CULTURE: Culture: NO GROWTH

## 2019-02-07 ENCOUNTER — Other Ambulatory Visit: Payer: Self-pay | Admitting: Gastroenterology

## 2019-02-07 DIAGNOSIS — R1310 Dysphagia, unspecified: Secondary | ICD-10-CM

## 2019-02-15 ENCOUNTER — Other Ambulatory Visit: Payer: Self-pay

## 2019-02-15 ENCOUNTER — Ambulatory Visit
Admission: RE | Admit: 2019-02-15 | Discharge: 2019-02-15 | Disposition: A | Discharge status: Home / Self Care | Payer: Medicare Other | Source: Ambulatory Visit | Attending: Gastroenterology | Admitting: Gastroenterology

## 2019-02-15 DIAGNOSIS — R1310 Dysphagia, unspecified: Secondary | ICD-10-CM | POA: Insufficient documentation

## 2019-02-21 ENCOUNTER — Ambulatory Visit: Payer: Medicare Other | Admitting: Psychiatry

## 2019-02-22 ENCOUNTER — Ambulatory Visit (INDEPENDENT_AMBULATORY_CARE_PROVIDER_SITE_OTHER): Payer: Medicare Other | Admitting: Vascular Surgery

## 2019-03-01 NOTE — Progress Notes (Signed)
   THERAPIST PROGRESS NOTE  Session Time: 60 min  Participation Level: Active  Behavioral Response: CasualAlertEuthymic  Type of Therapy: Individual Therapy  Treatment Goals addressed: Coping  Interventions: CBT and Motivational Interviewing  Summary: Sabrina Small is a 85 y.o. female who presents with continued symptoms.  Therapist met with Patient in an initial therapy session to assess current mood and to build rapport. Therapist engaged Patient in discussion about her life and what is going well for her. Therapist provided support for Patient as she shared details about her life, her current stressors, mood, coping skills, her past, and her children. Therapist prompted Patient to discuss her support system and ways that she manages her daily stress, anger, and frustrations.  LCSW discussed what psychotherapy is and is not and the importance of the therapeutic relationship to include open and honest communication between client and therapist and building trust.  Reviewed advantages and disadvantages of the therapeutic process and limitations to the therapeutic relationship including LCSW's role in maintaining the safety of the client, others and those in client's care.    Suicidal/Homicidal: No  Plan: Return again in 2 weeks.  Diagnosis: Axis I: Depression    Axis II: No diagnosis    Lubertha South, LCSW 01/10/2019

## 2019-04-02 ENCOUNTER — Ambulatory Visit (INDEPENDENT_AMBULATORY_CARE_PROVIDER_SITE_OTHER): Payer: Medicare Other | Admitting: Vascular Surgery

## 2019-04-05 ENCOUNTER — Ambulatory Visit: Admission: RE | Admit: 2019-04-05 | Payer: Medicare Other | Source: Home / Self Care | Admitting: Gastroenterology

## 2019-04-05 ENCOUNTER — Encounter: Admission: RE | Payer: Self-pay | Source: Home / Self Care

## 2019-04-05 SURGERY — ESOPHAGOGASTRODUODENOSCOPY (EGD) WITH PROPOFOL
Anesthesia: General

## 2019-05-07 ENCOUNTER — Ambulatory Visit (INDEPENDENT_AMBULATORY_CARE_PROVIDER_SITE_OTHER): Payer: Medicare Other | Admitting: Vascular Surgery

## 2019-06-18 ENCOUNTER — Ambulatory Visit (INDEPENDENT_AMBULATORY_CARE_PROVIDER_SITE_OTHER): Payer: Medicare Other | Admitting: Vascular Surgery

## 2019-06-29 ENCOUNTER — Other Ambulatory Visit: Payer: Medicare Other | Attending: Gastroenterology

## 2019-07-03 ENCOUNTER — Encounter: Admission: RE | Payer: Self-pay | Source: Home / Self Care

## 2019-07-03 ENCOUNTER — Ambulatory Visit: Admission: RE | Admit: 2019-07-03 | Payer: Medicare Other | Source: Home / Self Care | Admitting: Gastroenterology

## 2019-07-03 SURGERY — ESOPHAGOGASTRODUODENOSCOPY (EGD) WITH PROPOFOL
Anesthesia: General

## 2019-08-16 ENCOUNTER — Ambulatory Visit (INDEPENDENT_AMBULATORY_CARE_PROVIDER_SITE_OTHER): Payer: Medicare Other | Admitting: Vascular Surgery

## 2019-08-27 ENCOUNTER — Ambulatory Visit (INDEPENDENT_AMBULATORY_CARE_PROVIDER_SITE_OTHER): Payer: Medicare Other | Admitting: Vascular Surgery

## 2019-10-08 NOTE — Progress Notes (Deleted)
Called patient daughter no answer left message

## 2019-10-09 ENCOUNTER — Telehealth: Payer: Self-pay | Admitting: Emergency Medicine

## 2019-10-09 ENCOUNTER — Inpatient Hospital Stay: Payer: Medicare Other

## 2019-10-09 ENCOUNTER — Inpatient Hospital Stay: Payer: Medicare Other | Admitting: Oncology

## 2019-10-09 NOTE — Telephone Encounter (Signed)
Pt's daughter called and left message regarding pt's appointment today. Asked that we reschedule it due to pt not wanting to go to any appointments right now because of her concern over [place]. Message sent to scheduling team.

## 2019-11-29 IMAGING — RF ESOPHAGUS/BARIUM SWALLOW/TABLET STUDY
8 series · 14 of 18 positions shown · non-contrast
Comparison: None.

CLINICAL DATA: Dysphagia

EXAM:
ESOPHOGRAM / BARIUM SWALLOW / BARIUM TABLET STUDY
TECHNIQUE: Combined double contrast and single contrast examination performed
using thick barium liquid and thin barium liquid. The patient was
observed with fluoroscopy swallowing a 13 mm barium sulphate tablet.
FLUOROSCOPY TIME:  Fluoroscopy Time:  1 minutes 42 seconds
Radiation Exposure Index (if provided by the fluoroscopic device):
67.60 mGy
Number of Acquired Spot Images: 24

[Series 1: fluoro_barium 2fps_bw · 0.17mm/px · 3 of 8 frames shown (1 of 8)]
[frame 2/8]
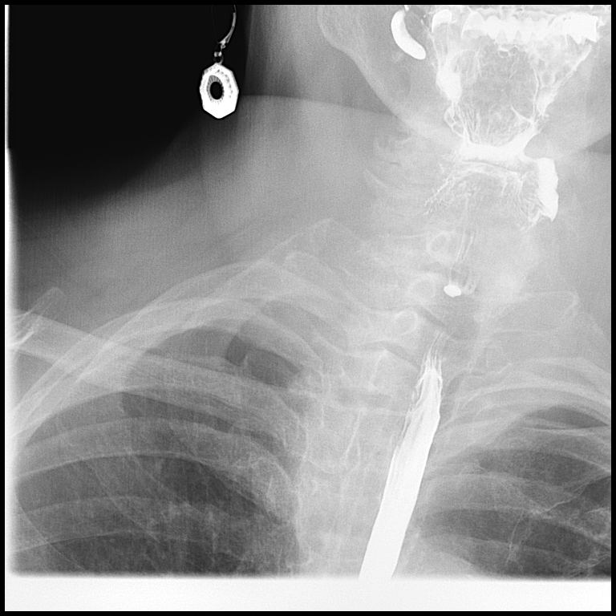
[frame 5/8]
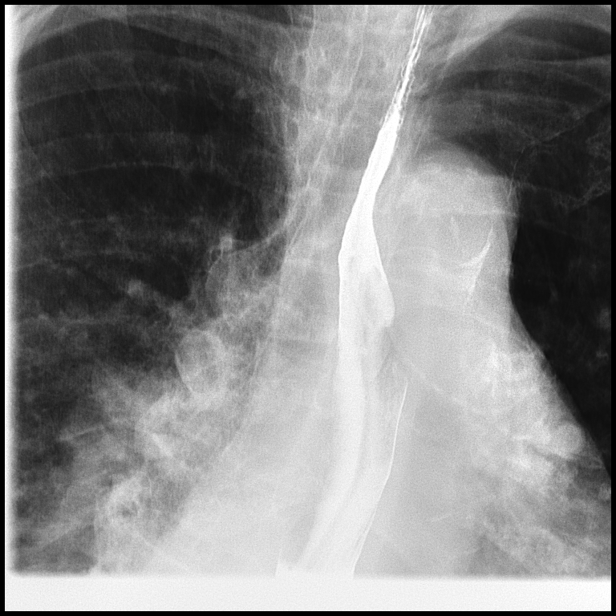
[frame 7/8]
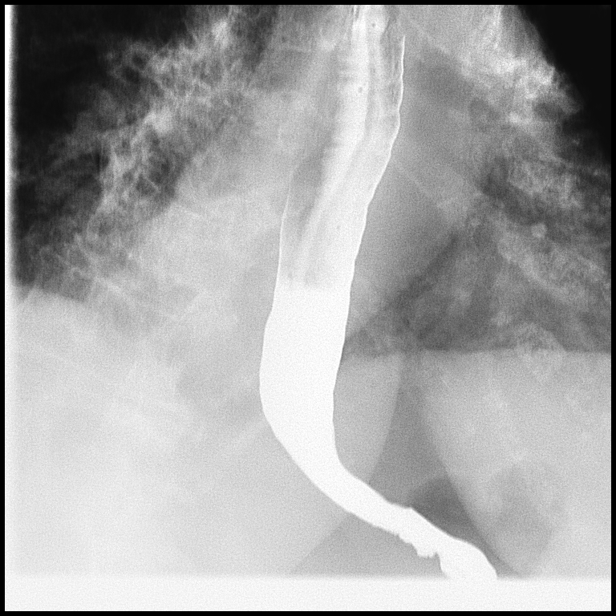

[Series 2: fluoro_barium 2fps_bw · 0.17mm/px · 1 of 1 slices shown (2 of 8)]
[im 1/1]
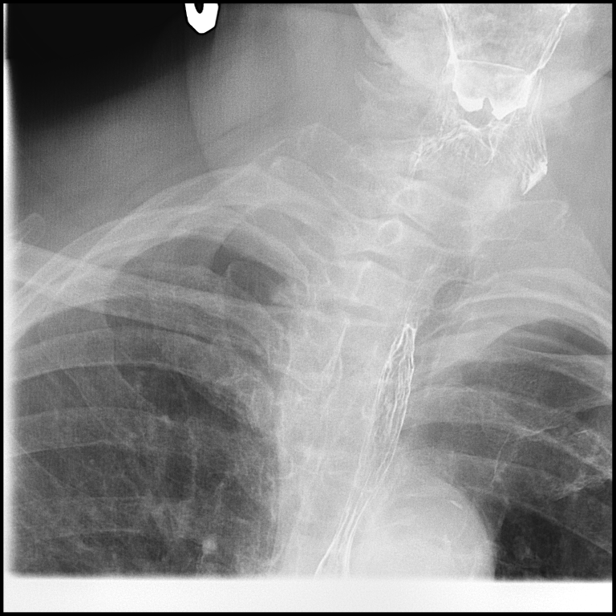

[Series 3: fluoro_barium 2fps_bw · 0.17mm/px · 3 of 8 frames shown (3 of 8)]
[frame 2/8]
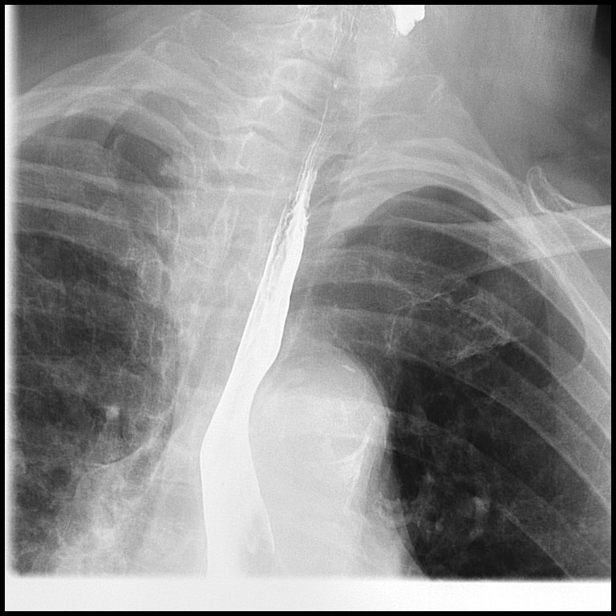
[frame 5/8]
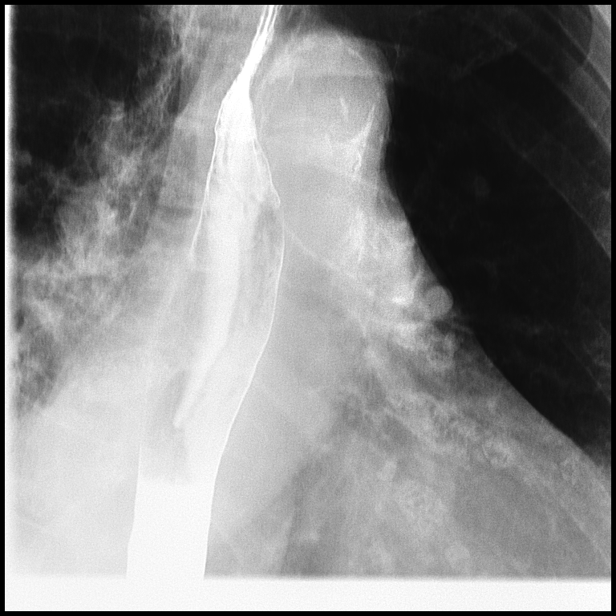
[frame 7/8]
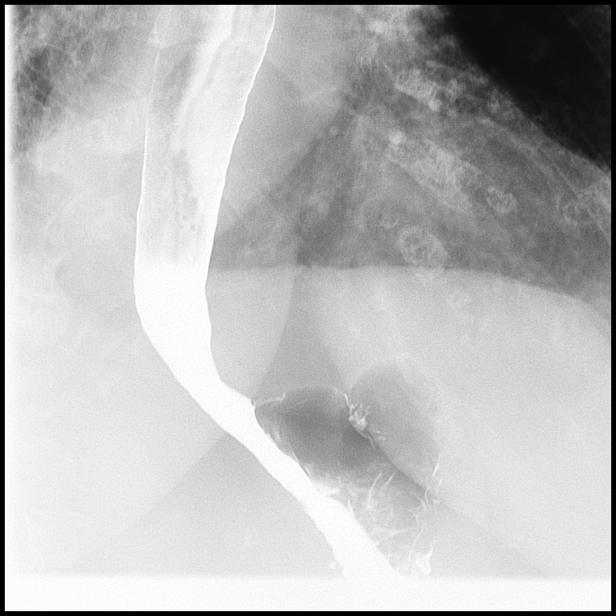

[Series 4: fluoro_barium 2fps_bw · 0.18mm/px · 1 of 1 slices shown (4 of 8)]
[im 1/1]
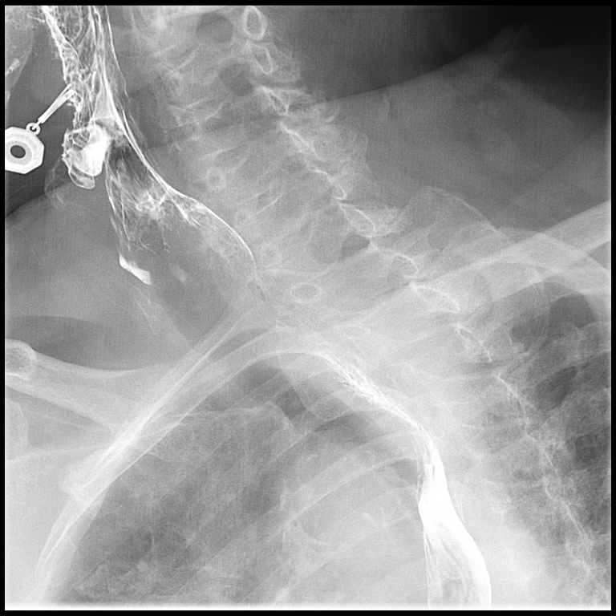

[Series 5: fluoro_barium 2fps_bw · 0.18mm/px · 3 of 20 frames shown (5 of 8)]
[frame 1/20]
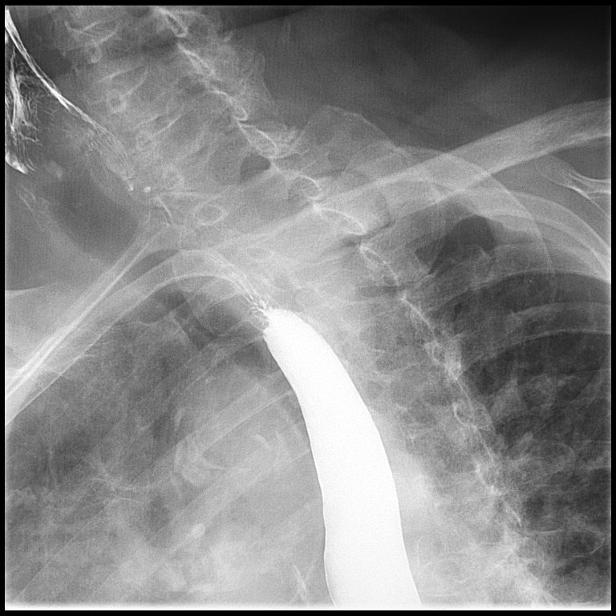
[frame 11/20]
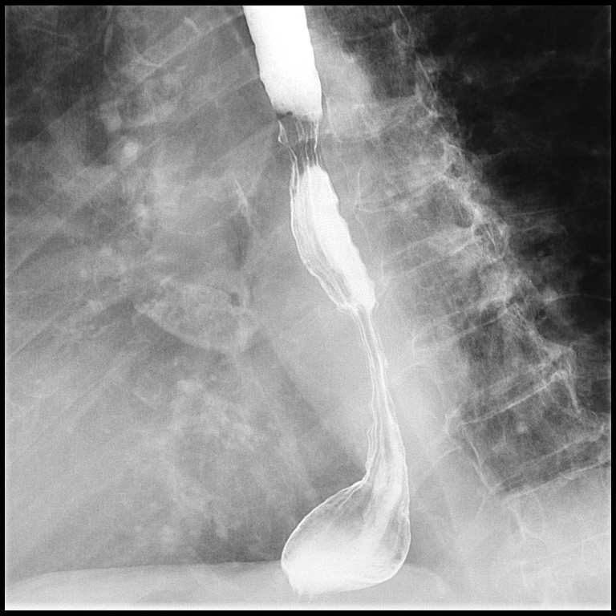
[frame 18/20]
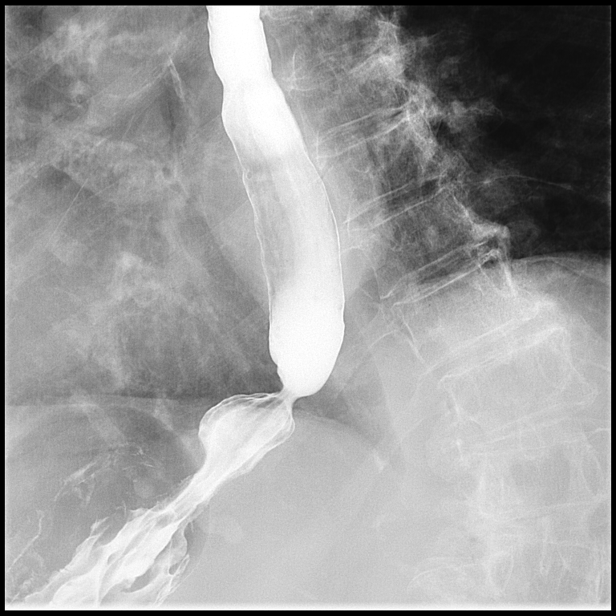

[Series 6: fluoro_barium 2fps_bw · 0.19mm/px · 1 of 2 frames shown (6 of 8)]
[frame 1/2]
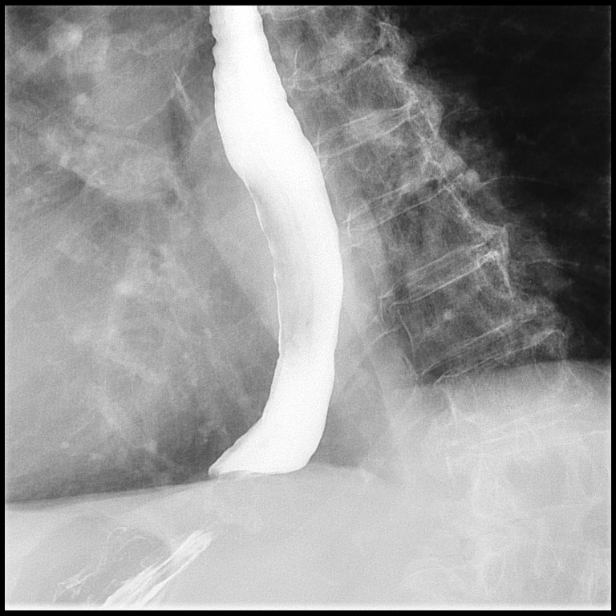

[Series 7: fluoro_barium 2fps_bw · 0.19mm/px · 1 of 1 slices shown (7 of 8)]
[im 1/1]
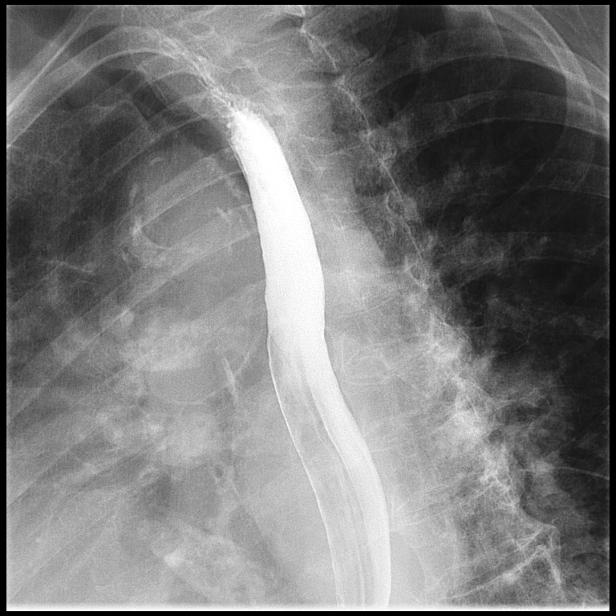

[Series 8: fluoro_barium 2fps_bw · 0.18mm/px · 1 of 1 slices shown (8 of 8)]
[im 1/1]
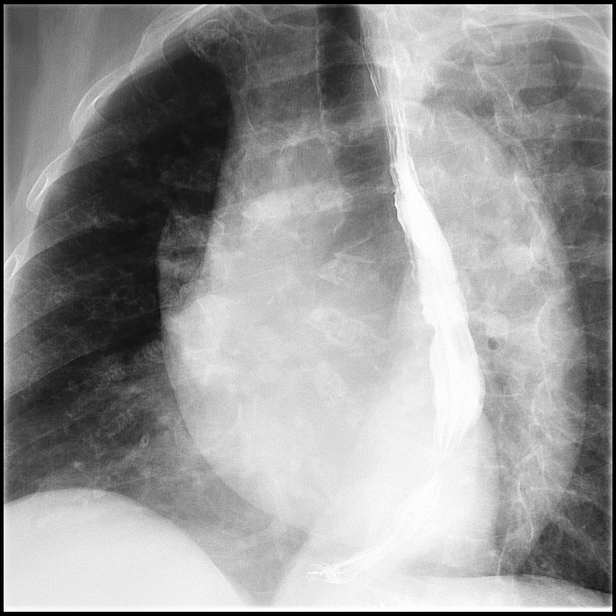

[14 of 18 positions shown; findings below may reference images not displayed]

FINDINGS: The patient swallows without difficulty. There is somewhat
diminished primary peristalsis with intermittent tertiary
contractions.

There is no appreciable hiatal hernia. There is intermittent spasm
in the distal esophagus which resolves during the course of the
study. There is no appreciable esophageal stricture. Note that there
is reflux of a near complete column of barium to the upper esophagus
which empties rather slowly. There is no esophageal mass or
ulceration. The pharynx appears normal.

13 mm barium tablet passed readily to the level of the aortic arch.
The tablet remained for several seconds in this area before passing
into the distal esophagus at the site of previously noted spasm. The
tablet remained in this area for nearly 1 minute before passing
intact into the stomach.
IMPRESSION: 1.  Moderate esophageal dysmotility.

2. No hiatal hernia. Intermittent spasm in the distal esophagus but
no fixed stricture. Moderate generalized spontaneous esophageal
reflux.

3.  No mass or ulceration.  No evident mucosal lesions.

## 2019-12-17 ENCOUNTER — Ambulatory Visit (INDEPENDENT_AMBULATORY_CARE_PROVIDER_SITE_OTHER): Payer: Medicare Other | Admitting: Vascular Surgery

## 2020-03-03 ENCOUNTER — Ambulatory Visit (INDEPENDENT_AMBULATORY_CARE_PROVIDER_SITE_OTHER): Payer: Medicare Other | Admitting: Vascular Surgery

## 2020-03-17 ENCOUNTER — Ambulatory Visit (INDEPENDENT_AMBULATORY_CARE_PROVIDER_SITE_OTHER): Payer: Medicare Other | Admitting: Vascular Surgery

## 2020-03-24 ENCOUNTER — Ambulatory Visit (INDEPENDENT_AMBULATORY_CARE_PROVIDER_SITE_OTHER): Payer: Medicare Other | Admitting: Vascular Surgery

## 2020-03-31 ENCOUNTER — Ambulatory Visit (INDEPENDENT_AMBULATORY_CARE_PROVIDER_SITE_OTHER): Payer: Medicare Other | Admitting: Vascular Surgery

## 2020-04-04 ENCOUNTER — Telehealth: Payer: Self-pay | Admitting: Oncology

## 2020-04-04 NOTE — Telephone Encounter (Signed)
Writer spoke with patient's daughter on this date. Daughter stated that she would not be able to transport patient and wanted to reschedule. Daughter also mentioned that patient lives at an [facility] and that she recently had lab work. Daughter stated that she would like to see if they could do her lab work there and if so, then she would change patient's appt to a virtual visit. Message sent to team RN to check on labs. Appts have been rescheduled to in person at this time, pending out coming of lab work location.

## 2020-04-04 NOTE — Telephone Encounter (Signed)
I called [facility] and spoke to med tech alia and told her that daughter wants her to have labs there with md making house calls. It is cbc/d.  She will call them and ask and call me back. I have called the daughter to tell her that I am working on it and will give her update when I hear back from [place]. The pt's appt is 5/17. Daughter agreeable.

## 2020-04-07 ENCOUNTER — Inpatient Hospital Stay: Payer: Medicare Other

## 2020-04-07 ENCOUNTER — Inpatient Hospital Stay: Payer: Medicare Other | Admitting: Oncology

## 2020-04-21 ENCOUNTER — Telehealth: Payer: Self-pay | Admitting: Oncology

## 2020-04-21 ENCOUNTER — Inpatient Hospital Stay: Payer: Medicare Other | Admitting: Oncology

## 2020-04-21 ENCOUNTER — Inpatient Hospital Stay: Payer: Medicare Other

## 2020-04-21 NOTE — Telephone Encounter (Signed)
Patient's daughter phoned writer on this date and stated that patient had a fall over the weekend and wanted to reschedule her appt. Appt rescheduled for 04-29-20.

## 2020-04-23 ENCOUNTER — Telehealth: Payer: Self-pay | Admitting: *Deleted

## 2020-04-23 NOTE — Telephone Encounter (Signed)
Called and spoke to daughter and she was agreeable to do video visit and let pt stay in [facility]. She suggested that we use ipad with the [facility] because her phone is so small. I called [facility] and spoke to [place] and he states that we can use [employer] I pad. The email address is [email or web address]. I have changed the appt to be my chart and gave her the email address. The time will stay the same as 5/25 at 11:15. We do not need a lab, dr Janese Banks has reviewed the one from 02/2020 and it is good.I have also called Horris Latino at [facility] and told her the appt time and it will be on her computer . She is agreeable to this

## 2020-04-29 ENCOUNTER — Encounter: Payer: Self-pay | Admitting: Oncology

## 2020-04-29 ENCOUNTER — Inpatient Hospital Stay: Payer: Medicare Other

## 2020-04-29 ENCOUNTER — Inpatient Hospital Stay: Payer: Medicare Other | Attending: Oncology | Admitting: Oncology

## 2020-04-29 ENCOUNTER — Telehealth: Payer: Self-pay

## 2020-04-29 DIAGNOSIS — D7589 Other specified diseases of blood and blood-forming organs: Secondary | ICD-10-CM

## 2020-04-29 DIAGNOSIS — D693 Immune thrombocytopenic purpura: Secondary | ICD-10-CM

## 2020-04-29 NOTE — Progress Notes (Signed)
RN called to access/pre- screen patient for virtual appoinment today with oncologist. Unable to speech to patient. Daughter answered some questions, she stated patient fell and has extreme back pain. Nurse at [facility] will fax medication list.

## 2020-05-02 NOTE — Progress Notes (Signed)
I connected with Sabrina Small on 05/02/20 at 11:15 AM EDT by video enabled telemedicine visit and verified that I am speaking with the correct person using two identifiers.   I discussed the limitations, risks, security and privacy concerns of performing an evaluation and management service by telemedicine and the availability of in-person appointments. I also discussed with the patient that there may be a patient responsible charge related to this service. The patient expressed understanding and agreed to proceed.  Other persons participating in the visit and their role in the encounter:  none  Patient's location:  home Provider's location:  work  Risk analyst Complaint: Routine follow-up of thrombocytopenia  History of present illness: patient is a 86 year old female who follows up with Dr. Gustavo Lah from [facility] for fatty liver. Recent ultrasound of the abdomen on 02/18/2017 showed prominent liver with increased liver echogenicity. These findings are indicated of hepatic steatosis. Also recent CBC from 03/02/2017 showed white count of 3.2, H&H of 13.8/38.4 and a platelet count of 98. She has been referred to [place] for evaluation and management of thrombocytopenia.Her platelet count has been ranging between 80's- 120's since December 2017. She denies any bleeding or bruising  Results of blood work from 03/08/2017 were as follows: CBC showed white count of 3.7 with a normal differential, H&H of 14.3/41.5 and a platelet count of 94. B12 and folate were within normal limits. Review of peripheral smear showed mild thrombocytopenia and normal morphology  Interval history: Patient is currently doing well.  She has not had any bleeding or bruising.  No recent hospitalizations.   Review of Systems  Constitutional: Negative for chills, fever, malaise/fatigue and weight loss.  HENT: Negative for congestion, ear discharge and nosebleeds.   Eyes: Negative for blurred vision.  Respiratory: Negative for  cough, hemoptysis, sputum production, shortness of breath and wheezing.   Cardiovascular: Negative for chest pain, palpitations, orthopnea and claudication.  Gastrointestinal: Negative for abdominal pain, blood in stool, constipation, diarrhea, heartburn, melena, nausea and vomiting.  Genitourinary: Negative for dysuria, flank pain, frequency, hematuria and urgency.  Musculoskeletal: Negative for back pain, joint pain and myalgias.  Skin: Negative for rash.  Neurological: Negative for dizziness, tingling, focal weakness, seizures, weakness and headaches.  Endo/Heme/Allergies: Does not bruise/bleed easily.  Psychiatric/Behavioral: Negative for depression and suicidal ideas. The patient does not have insomnia.     Allergies  Allergen Reactions  . Ciprocin-Fluocin-Procin  [Fluocinolone] Other (See Comments)  . Doxycycline     Patient cannot recall details of reaction  . Dust Mite Mixed Allergen Ext [Mite (D. Farinae)]   . Glipizide   . Iodinated Diagnostic Agents   . Iodine Other (See Comments)  . Liraglutide Other (See Comments)  . Lortab [Hydrocodone-Acetaminophen]   . Morphine And Related   . Omnicef [Cefdinir] Other (See Comments)    Patient cannot recall details of reaction  . Other     Cat dander  . Penicillins Other (See Comments)    Has patient had a PCN reaction causing immediate rash, facial/tongue/throat swelling, SOB or lightheadedness with hypotension: Unknown Has patient had a PCN reaction causing severe rash involving mucus membranes or skin necrosis: Unknown Has patient had a PCN reaction that required hospitalization: Unknown Has patient had a PCN reaction occurring within the last 10 years: No If all of the above answers are "NO", then may proceed with Cephalosporin use. "I blow up and pass out" Over 10 yrs ago.  Marland Kitchen Percocet [Oxycodone-Acetaminophen] Other (See Comments)  . Rosuvastatin   .  Sulfa Antibiotics Other (See Comments)    Patient cannot recall details of  reaction  . Sulfamethoxazole-Trimethoprim     Other reaction(s): Unknown Other reaction(s): Unknown   . Sulfasalazine     Other reaction(s): Other (See Comments) Patient cannot recall details of reaction  . Trimethoprim   . Trovafloxacin     Patient cannot recall details of reaction  . Vancomycin Other (See Comments)  . Voltaren [Diclofenac Sodium] Other (See Comments)  . Zolpidem     Past Medical History:  Diagnosis Date  . Allergy   . Arthritis   . Bell's palsy   . CHF (congestive heart failure) ([place])   . Colon polyps   . Depression   . Diabetes mellitus without complication ([place])   . Diabetes mellitus, type II ([place])   . Diverticulitis   . Gout   . Hypothyroidism   . Sleep apnea     Past Surgical History:  Procedure Laterality Date  . ABDOMINAL HYSTERECTOMY    . CHOLECYSTECTOMY    . COLONOSCOPY WITH PROPOFOL N/A 02/04/2017   Procedure: COLONOSCOPY WITH PROPOFOL;  Surgeon: Lollie Sails, MD;  Location: [facility] ENDOSCOPY;  Service: Endoscopy;  Laterality: N/A;  . HERNIA REPAIR      Social History   Socioeconomic History  . Marital status: Widowed    Spouse name: Not on file  . Number of children: 5  . Years of education: Not on file  . Highest education level: Some college, no degree  Occupational History  . Not on file  Tobacco Use  . Smoking status: Former Smoker    Packs/day: 2.00    Years: 30.00    Pack years: 60.00    Quit date: 03/08/1985    Years since quitting: 35.1  . Smokeless tobacco: Never Used  Substance and Sexual Activity  . Alcohol use: No  . Drug use: No  . Sexual activity: Not Currently  Other Topics Concern  . Not on file  Social History Narrative  . Not on file   Social Determinants of Health   Financial Resource Strain:   . Difficulty of Paying Living Expenses:   Food Insecurity:   . Worried About Charity fundraiser in the Last Year:   . Arboriculturist in the Last Year:   Transportation Needs:   . Film/video editor  (Medical):   Marland Kitchen Lack of Transportation (Non-Medical):   Physical Activity:   . Days of Exercise per Week:   . Minutes of Exercise per Session:   Stress:   . Feeling of Stress :   Social Connections:   . Frequency of Communication with Friends and Family:   . Frequency of Social Gatherings with Friends and Family:   . Attends Religious Services:   . Active Member of Clubs or Organizations:   . Attends Archivist Meetings:   Marland Kitchen Marital Status:   Intimate Partner Violence:   . Fear of Current or Ex-Partner:   . Emotionally Abused:   Marland Kitchen Physically Abused:   . Sexually Abused:     Family History  Adopted: Yes  Problem Relation Age of Onset  . Mental illness Son      Current Outpatient Medications:  .  acetaminophen (TYLENOL) 325 MG tablet, Take 650 mg by mouth 2 (two) times a week., Disp: , Rfl:  .  Albuterol Sulfate 2.5 MG/0.5ML NEBU, Inhale 1 each into the lungs in the morning and at bedtime., Disp: , Rfl:  .  allopurinol (ZYLOPRIM)  100 MG tablet, Take 100 mg by mouth daily., Disp: , Rfl:  .  amLODipine (NORVASC) 10 MG tablet, Take 10 mg by mouth daily., Disp: , Rfl:  .  aspirin EC 81 MG tablet, Take 81 mg by mouth daily., Disp: , Rfl:  .  azelastine (ASTELIN) 0.1 % nasal spray, Place 2 sprays into both nostrils 2 (two) times daily. Use in each nostril as directed, Disp: , Rfl:  .  bisacodyl (DULCOLAX) 5 MG EC tablet, Take 5 mg by mouth daily as needed for moderate constipation., Disp: , Rfl:  .  buPROPion (WELLBUTRIN XL) 150 MG 24 hr tablet, Take 150 mg by mouth daily., Disp: , Rfl:  .  DULoxetine (CYMBALTA) 20 MG capsule, Take 20 mg by mouth daily., Disp: , Rfl:  .  Emollient (GOLD BOND MEDICATED BODY) 5-0.5 % LOTN, Apply 1 Dose topically daily., Disp: , Rfl:  .  fenofibrate micronized (LOFIBRA) 200 MG capsule, Take 200 mg by mouth daily., Disp: , Rfl:  .  fluconazole (DIFLUCAN) 150 MG tablet, Take 150 mg by mouth every other day., Disp: , Rfl:  .   fluticasone-salmeterol (ADVAIR HFA) 230-21 MCG/ACT inhaler, Inhale 2 puffs into the lungs 2 (two) times daily., Disp: , Rfl:  .  insulin aspart (NOVOLOG) 100 UNIT/ML FlexPen, Inject 8 Units into the skin 3 (three) times daily before meals. As needed if BG >200, Disp: , Rfl:  .  Insulin Detemir (LEVEMIR FLEXTOUCH) 100 UNIT/ML Pen, Inject 40 Units into the skin at bedtime. , Disp: , Rfl:  .  levothyroxine (SYNTHROID, LEVOTHROID) 75 MCG tablet, Take 75 mcg by mouth daily before breakfast. , Disp: , Rfl:  .  linaclotide (LINZESS) 145 MCG CAPS capsule, Take 1 capsule (145 mcg total) by mouth daily before breakfast., Disp: 30 capsule, Rfl: 0 .  meloxicam (MOBIC) 7.5 MG tablet, Take 7.5 mg by mouth 2 (two) times a week., Disp: , Rfl:  .  metoprolol succinate (TOPROL-XL) 50 MG 24 hr tablet, Take 1 tablet (50 mg total) by mouth daily. Take with or immediately following a meal., Disp: 30 tablet, Rfl: 1 .  nystatin cream (MYCOSTATIN), Apply 1 application topically 2 (two) times daily., Disp: , Rfl:  .  Omega-3 Fatty Acids (FISH OIL) 1000 MG CAPS, Take 1,000 mg by mouth 2 (two) times daily. , Disp: , Rfl:  .  omeprazole (PRILOSEC) 20 MG capsule, Take 20 mg by mouth daily., Disp: , Rfl:  .  Pitavastatin Calcium (LIVALO) 2 MG TABS, Take 2 mg by mouth every evening. , Disp: , Rfl:  .  polyvinyl alcohol (LIQUIFILM TEARS) 1.4 % ophthalmic solution, Place 2 drops into both eyes in the morning and at bedtime., Disp: , Rfl:  .  potassium chloride (K-DUR) 10 MEQ tablet, Take 10 mEq by mouth 3 (three) times a week. , Disp: , Rfl:  .  pregabalin (LYRICA) 150 MG capsule, Take 1 capsule (150 mg total) by mouth daily., Disp: , Rfl:  .  senna (SENOKOT) 8.6 MG TABS tablet, Take 2 tablets by mouth daily as needed for mild constipation., Disp: , Rfl:  .  torsemide (DEMADEX) 20 MG tablet, Take 6 tablets (120 mg total) by mouth daily. (Patient taking differently: Take 60 mg by mouth daily. ), Disp: 180 tablet, Rfl: 1 .  traMADol  (ULTRAM) 50 MG tablet, Take 50 mg by mouth 2 (two) times a week., Disp: , Rfl:  .  Vitamin D, Ergocalciferol, (DRISDOL) 50000 units CAPS capsule, Take 50,000 Units by mouth  every 30 (thirty) days. , Disp: , Rfl:  .  albuterol (VENTOLIN HFA) 108 (90 Base) MCG/ACT inhaler, Inhale 2 puffs into the lungs every 6 (six) hours as needed for shortness of breath. , Disp: , Rfl:  .  BD AUTOSHIELD DUO 30G X 5 MM MISC, USE WITH LEVEMIR AND NOVOLOG, Disp: 100 each, Rfl: 1 .  Biotin 1000 MCG tablet, Take 1,000 mcg by mouth daily., Disp: , Rfl:  .  fluticasone (FLONASE) 50 MCG/ACT nasal spray, Place 1 spray into both nostrils daily., Disp: , Rfl:  .  hydroxypropyl methylcellulose / hypromellose (ISOPTO TEARS / GONIOVISC) 2.5 % ophthalmic solution, Place 2 drops into both eyes 2 (two) times daily., Disp: , Rfl:  .  hydrOXYzine (ATARAX/VISTARIL) 25 MG tablet, Take 25 mg by mouth 3 (three) times daily as needed., Disp: , Rfl:  .  liver oil-zinc oxide (DESITIN) 40 % ointment, Apply 1 application topically 3 (three) times daily., Disp: , Rfl:  .  loratadine (CLARITIN) 5 MG chewable tablet, Chew 5 mg by mouth daily., Disp: , Rfl:  .  polyethylene glycol (MIRALAX / GLYCOLAX) packet, Take 17 g by mouth daily., Disp: , Rfl:  .  polyethylene glycol (MIRALAX) packet, Take 17 g by mouth daily., Disp: 14 each, Rfl: 0  No results found.  No images are attached to the encounter.   CMP Latest Ref Rng & Units 02/02/2019  Glucose 70 - 99 mg/dL 105(H)  BUN 8 - 23 mg/dL 49(H)  Creatinine 0.44 - 1.00 mg/dL 1.89(H)  Sodium 135 - 145 mmol/L 139  Potassium 3.5 - 5.1 mmol/L 3.7  Chloride 98 - 111 mmol/L 101  CO2 22 - 32 mmol/L 27  Calcium 8.9 - 10.3 mg/dL 9.1  Total Protein 6.5 - 8.1 g/dL 6.5  Total Bilirubin 0.3 - 1.2 mg/dL 0.7  Alkaline Phos 38 - 126 U/L 47  AST 15 - 41 U/L 37  ALT 0 - 44 U/L 23   CBC Latest Ref Rng & Units 02/02/2019  WBC 4.0 - 10.5 K/uL 4.4  Hemoglobin 12.0 - 15.0 g/dL 14.0  Hematocrit 36.0 - 46.0 %  42.6  Platelets 150 - 400 K/uL 103(L)     Observation/objective: Appears in no acute distress over video visit today.  Breathing is nonlabored  Assessment and plan: Patient is a 86 year old female with chronic isolated thrombocytopenia likely secondary to ITP.  She is here for routine follow-up visit  On review of recent labs which were done at outside facility showed patient's white cell count was 4.6, H&H 12.5/42 with an MCV of 110 and a platelet count of 12/15/2017.  B12 and folate levels were checked and were within normal limits.  In terms of her thrombocytopenia and has remained stable over the last 5 years and I suspect it is secondary to ITP.  However patient's hemoglobin is slightly dropped from 14-12 with an MCV of 110 indicating a macrocytic anemia.  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  This does not require any intervention at this time.  I will continue to monitor her CBC every 6 months and see her back in 1 year mainly for her macrocytosis.  Labs will be checked at [facility] diagnostics through doctors making house calls  Follow-up instructions: As above  I discussed the assessment and treatment plan with the patient. The patient was provided an opportunity to ask questions and all were answered. The patient agreed with the plan and demonstrated an understanding of the instructions.   The patient  was advised to call back or seek an in-person evaluation if the symptoms worsen or if the condition fails to improve as anticipated.    Visit Diagnosis: 1. Chronic ITP (idiopathic thrombocytopenia) (HCC)   2. Macrocytosis     Dr. Randa Evens, MD, MPH [place] [facility] Tel- [phone] 05/02/2020 1:24 PM

## 2020-05-07 NOTE — Addendum Note (Signed)
Addended by: Luella Cook on: 05/07/2020 09:56 PM   Modules accepted: Orders

## 2020-05-08 ENCOUNTER — Ambulatory Visit (INDEPENDENT_AMBULATORY_CARE_PROVIDER_SITE_OTHER): Payer: Medicare Other | Admitting: Vascular Surgery

## 2020-05-08 ENCOUNTER — Other Ambulatory Visit: Payer: Self-pay

## 2020-05-08 ENCOUNTER — Encounter (INDEPENDENT_AMBULATORY_CARE_PROVIDER_SITE_OTHER): Payer: Self-pay | Admitting: Vascular Surgery

## 2020-05-08 VITALS — BP 132/78 | HR 66 | Ht 59.0 in | Wt 164.0 lb

## 2020-05-08 DIAGNOSIS — I83013 Varicose veins of right lower extremity with ulcer of ankle: Secondary | ICD-10-CM

## 2020-05-08 DIAGNOSIS — J449 Chronic obstructive pulmonary disease, unspecified: Secondary | ICD-10-CM

## 2020-05-08 DIAGNOSIS — I739 Peripheral vascular disease, unspecified: Secondary | ICD-10-CM | POA: Diagnosis not present

## 2020-05-08 DIAGNOSIS — L97319 Non-pressure chronic ulcer of right ankle with unspecified severity: Secondary | ICD-10-CM | POA: Diagnosis not present

## 2020-05-08 DIAGNOSIS — I10 Essential (primary) hypertension: Secondary | ICD-10-CM | POA: Diagnosis not present

## 2020-05-08 DIAGNOSIS — I872 Venous insufficiency (chronic) (peripheral): Secondary | ICD-10-CM

## 2020-05-08 NOTE — Progress Notes (Signed)
MRN : [id number]  Sabrina Small is a 86 y.o. (1933/03/01) female who presents with chief complaint of  Chief Complaint  Patient presents with  . Follow-up    6 Mo F/u  .  History of Present Illness:   Patient is seen for follow up evaluation of leg pain and swelling associated with venous ulceration. The patient was treated with Unna boot therapy.  The patient has also noted a progressive worsening of the discoloration in the ankle and shin area.   The right ankle ulcer has recurred and the patient is now back in [employer].  The patient states that they have been elevating as much as possible. The patient denies any recent changes in medications.  The patient denies a history of DVT or PE. There is no prior history of phlebitis. There is no history of primary lymphedema.  No SOB or increased cough.  No sputum production.  No recent episodes of CHF exacerbation.  No outpatient medications have been marked as taking for the 05/08/20 encounter (Office Visit) with Delana Meyer, Dolores Lory, MD.    Past Medical History:  Diagnosis Date  . Allergy   . Arthritis   . Bell's palsy   . CHF (congestive heart failure) ([place])   . Colon polyps   . Depression   . Diabetes mellitus without complication ([place])   . Diabetes mellitus, type II ([place])   . Diverticulitis   . Gout   . Hypothyroidism   . Sleep apnea     Past Surgical History:  Procedure Laterality Date  . ABDOMINAL HYSTERECTOMY    . CHOLECYSTECTOMY    . COLONOSCOPY WITH PROPOFOL N/A 02/04/2017   Procedure: COLONOSCOPY WITH PROPOFOL;  Surgeon: Lollie Sails, MD;  Location: [facility] ENDOSCOPY;  Service: Endoscopy;  Laterality: N/A;  . HERNIA REPAIR      Social History Social History   Tobacco Use  . Smoking status: Former Smoker    Packs/day: 2.00    Years: 30.00    Pack years: 60.00    Quit date: 03/08/1985    Years since quitting: 35.1  . Smokeless tobacco: Never Used  Substance Use Topics  . Alcohol use: No  . Drug  use: No    Family History Family History  Adopted: Yes  Problem Relation Age of Onset  . Mental illness Son     Allergies  Allergen Reactions  . Ciprocin-Fluocin-Procin  [Fluocinolone] Other (See Comments)  . Doxycycline     Patient cannot recall details of reaction  . Dust Mite Mixed Allergen Ext [Mite (D. Farinae)]   . Glipizide   . Iodinated Diagnostic Agents   . Iodine Other (See Comments)  . Liraglutide Other (See Comments)  . Lortab [Hydrocodone-Acetaminophen]   . Morphine And Related   . Omnicef [Cefdinir] Other (See Comments)    Patient cannot recall details of reaction  . Other     Cat dander  . Penicillins Other (See Comments)    Has patient had a PCN reaction causing immediate rash, facial/tongue/throat swelling, SOB or lightheadedness with hypotension: Unknown Has patient had a PCN reaction causing severe rash involving mucus membranes or skin necrosis: Unknown Has patient had a PCN reaction that required hospitalization: Unknown Has patient had a PCN reaction occurring within the last 10 years: No If all of the above answers are "NO", then may proceed with Cephalosporin use. "I blow up and pass out" Over 10 yrs ago.  Marland Kitchen Percocet [Oxycodone-Acetaminophen] Other (See Comments)  . Rosuvastatin   .  Sulfa Antibiotics Other (See Comments)    Patient cannot recall details of reaction  . Sulfamethoxazole-Trimethoprim     Other reaction(s): Unknown Other reaction(s): Unknown   . Sulfasalazine     Other reaction(s): Other (See Comments) Patient cannot recall details of reaction  . Trimethoprim   . Trovafloxacin     Patient cannot recall details of reaction  . Vancomycin Other (See Comments)  . Voltaren [Diclofenac Sodium] Other (See Comments)  . Zolpidem      REVIEW OF SYSTEMS (Negative unless checked)  Constitutional: [] Weight loss  [] Fever  [] Chills Cardiac: [] Chest pain   [] Chest pressure   [] Palpitations   [] Shortness of breath when laying flat    [] Shortness of breath with exertion. Vascular:  [] Pain in legs with walking   [x] Pain in legs at rest  [] History of DVT   [] Phlebitis   [x] Swelling in legs   [] Varicose veins   [x] Non-healing ulcers Pulmonary:   [] Uses home oxygen   [] Productive cough   [] Hemoptysis   [] Wheeze  [] COPD   [] Asthma Neurologic:  [] Dizziness   [] Seizures   [] History of stroke   [] History of TIA  [] Aphasia   [] Vissual changes   [] Weakness or numbness in arm   [] Weakness or numbness in leg Musculoskeletal:   [] Joint swelling   [x] Joint pain   [] Low back pain Hematologic:  [] Easy bruising  [] Easy bleeding   [] Hypercoagulable state   [] Anemic Gastrointestinal:  [] Diarrhea   [] Vomiting  [] Gastroesophageal reflux/heartburn   [] Difficulty swallowing. Genitourinary:  [] Chronic kidney disease   [] Difficult urination  [] Frequent urination   [] Blood in urine Skin:  [] Rashes   [] Ulcers  Psychological:  [] History of anxiety   []  History of major depression.  Physical Examination  Vitals:   05/08/20 1321  BP: 132/78  Pulse: 66  Weight: 164 lb (74.4 kg)  Height: 4\' 11"  (1.499 m)   Body mass index is 33.12 kg/m. Gen: WD/WN, NAD Head: [place]/AT, No temporalis wasting.  Ear/Nose/Throat: Hearing grossly intact, nares w/o erythema or drainage Eyes: PER, EOMI, sclera nonicteric.  Neck: Supple, no large masses.   Pulmonary:  Good air movement, no audible wheezing bilaterally, no use of accessory muscles.  Cardiac: RRR, no JVD Vascular: 2-3+ edema of the right leg with severe venous changes of the right leg.  Venous ulcer noted in the ankle area on the right, noninfected Vessel Right Left  Radial Palpable Palpable  PT Not Palpable Not Palpable  DP Not Palpable Not Palpable  Gastrointestinal: Non-distended. No guarding/no peritoneal signs.  Musculoskeletal: M/S 5/5 throughout.  No deformity or atrophy.  Neurologic: CN 2-12 intact. Symmetrical.  Speech is fluent. Motor exam as listed above. Psychiatric: Judgment intact, Mood &  affect appropriate for pt's clinical situation. Dermatologic: Venous stasis dermatitis with ulcers present on the right.  No changes consistent with cellulitis. Lymph : No lichenification or skin changes of chronic lymphedema.  CBC Lab Results  Component Value Date   WBC 4.4 02/02/2019   HGB 14.0 02/02/2019   HCT 42.6 02/02/2019   MCV 94.9 02/02/2019   PLT 103 (L) 02/02/2019    BMET    Component Value Date/Time   NA 139 02/02/2019 1228   K 3.7 02/02/2019 1228   CL 101 02/02/2019 1228   CO2 27 02/02/2019 1228   GLUCOSE 105 (H) 02/02/2019 1228   BUN 49 (H) 02/02/2019 1228   CREATININE 1.89 (H) 02/02/2019 1228   CALCIUM 9.1 02/02/2019 1228   GFRNONAA 24 (L) 02/02/2019 1228   GFRAA 28 (  L) 02/02/2019 1228   CrCl cannot be calculated (Patient's most recent lab result is older than the maximum 21 days allowed.).  COAG Lab Results  Component Value Date   INR 1.23 02/04/2017   INR 1.20 11/24/2016    Radiology No results found.   Assessment/Plan 1. Venous ulcer of ankle, right (HCC) No surgery or intervention at this point in time.    I have had a long discussion with the patient regarding venous insufficiency and why it  causes symptoms, specifically venous ulceration . I have discussed with the patient the chronic skin changes that accompany venous insufficiency and the long term sequela such as infection and recurring  ulceration.  Patient will be placed in [employer] which will be changed weekly drainage permitting.  In addition, behavioral modification including several periods of elevation of the lower extremities during the day will be continued. Achieving a position with the ankles at heart level was stressed to the patient  The patient is instructed to begin routine exercise, especially walking on a daily basis  Patient should undergo duplex ultrasound of the venous system to ensure that DVT or reflux is not present.  Following the review of the ultrasound the  patient will follow up in one week to reassess the degree of swelling and the control that Unna therapy is offering.   The patient can be assessed for graduated compression stockings or wraps as well as a Lymph Pump once the ulcers are healed.  - VAS [place] ABI WITH/WO TBI; Future  2. Chronic venous insufficiency No surgery or intervention at this point in time.    I have had a long discussion with the patient regarding venous insufficiency and why it  causes symptoms. I have discussed with the patient the chronic skin changes that accompany venous insufficiency and the long term sequela such as infection and ulceration.  Patient will begin wearing graduated compression stockings class 1 (20-30 mmHg) or compression wraps on a daily basis a prescription was given. The patient will put the stockings on first thing in the morning and removing them in the evening. The patient is instructed specifically not to sleep in the stockings.    In addition, behavioral modification including several periods of elevation of the lower extremities during the day will be continued. I have demonstrated that proper elevation is a position with the ankles at heart level.  The patient is instructed to begin routine exercise, especially walking on a daily basis   3. PAD (peripheral artery disease) (HCC) Recommend:  Patient should undergo ABI of the lower extremity because there has been recurrence of the ulcer in the patient's lower extremity symptoms.  The patient states they are having increased pain and a marked decrease in the distance that they can walk.  The patient will follow up with me in the office to review the studies.  - VAS [place] ABI WITH/WO TBI; Future  4. Essential hypertension Continue antihypertensive medications as already ordered, these medications have been reviewed and there are no changes at this time.   5. Chronic obstructive pulmonary disease, unspecified COPD type ([place]) Continue pulmonary  medications and aerosols as already ordered, these medications have been reviewed and there are no changes at this time.      Hortencia Pilar, MD  05/08/2020 1:24 PM

## 2020-05-12 ENCOUNTER — Ambulatory Visit (INDEPENDENT_AMBULATORY_CARE_PROVIDER_SITE_OTHER): Payer: Medicare Other | Admitting: Vascular Surgery

## 2020-05-14 ENCOUNTER — Encounter (INDEPENDENT_AMBULATORY_CARE_PROVIDER_SITE_OTHER): Payer: Self-pay | Admitting: Vascular Surgery

## 2020-05-14 DIAGNOSIS — I739 Peripheral vascular disease, unspecified: Secondary | ICD-10-CM | POA: Insufficient documentation

## 2020-05-27 ENCOUNTER — Emergency Department
Admission: EM | Admit: 2020-05-27 | Discharge: 2020-05-27 | Disposition: A | Discharge status: Home / Self Care | Payer: Medicare Other | Source: Home / Self Care | Attending: Emergency Medicine | Admitting: Emergency Medicine

## 2020-05-27 ENCOUNTER — Emergency Department: Payer: Medicare Other

## 2020-05-27 ENCOUNTER — Other Ambulatory Visit: Payer: Self-pay

## 2020-05-27 ENCOUNTER — Encounter: Payer: Self-pay | Admitting: *Deleted

## 2020-05-27 DIAGNOSIS — S0990XA Unspecified injury of head, initial encounter: Secondary | ICD-10-CM

## 2020-05-27 DIAGNOSIS — Y999 Unspecified external cause status: Secondary | ICD-10-CM | POA: Insufficient documentation

## 2020-05-27 DIAGNOSIS — Y92019 Unspecified place in single-family (private) house as the place of occurrence of the external cause: Secondary | ICD-10-CM | POA: Insufficient documentation

## 2020-05-27 DIAGNOSIS — S0083XA Contusion of other part of head, initial encounter: Secondary | ICD-10-CM

## 2020-05-27 DIAGNOSIS — I110 Hypertensive heart disease with heart failure: Secondary | ICD-10-CM | POA: Insufficient documentation

## 2020-05-27 DIAGNOSIS — J440 Chronic obstructive pulmonary disease with acute lower respiratory infection: Secondary | ICD-10-CM | POA: Insufficient documentation

## 2020-05-27 DIAGNOSIS — Z87891 Personal history of nicotine dependence: Secondary | ICD-10-CM | POA: Insufficient documentation

## 2020-05-27 DIAGNOSIS — Z794 Long term (current) use of insulin: Secondary | ICD-10-CM | POA: Insufficient documentation

## 2020-05-27 DIAGNOSIS — R600 Localized edema: Secondary | ICD-10-CM | POA: Diagnosis not present

## 2020-05-27 DIAGNOSIS — W19XXXA Unspecified fall, initial encounter: Secondary | ICD-10-CM

## 2020-05-27 DIAGNOSIS — I5031 Acute diastolic (congestive) heart failure: Secondary | ICD-10-CM | POA: Insufficient documentation

## 2020-05-27 DIAGNOSIS — E119 Type 2 diabetes mellitus without complications: Secondary | ICD-10-CM | POA: Insufficient documentation

## 2020-05-27 DIAGNOSIS — Z7982 Long term (current) use of aspirin: Secondary | ICD-10-CM | POA: Insufficient documentation

## 2020-05-27 DIAGNOSIS — S92154G Nondisplaced avulsion fracture (chip fracture) of right talus, subsequent encounter for fracture with delayed healing: Secondary | ICD-10-CM | POA: Diagnosis not present

## 2020-05-27 DIAGNOSIS — S098XXA Other specified injuries of head, initial encounter: Secondary | ICD-10-CM | POA: Insufficient documentation

## 2020-05-27 DIAGNOSIS — Y9389 Activity, other specified: Secondary | ICD-10-CM | POA: Insufficient documentation

## 2020-05-27 DIAGNOSIS — Z79899 Other long term (current) drug therapy: Secondary | ICD-10-CM | POA: Insufficient documentation

## 2020-05-27 DIAGNOSIS — W010XXA Fall on same level from slipping, tripping and stumbling without subsequent striking against object, initial encounter: Secondary | ICD-10-CM | POA: Insufficient documentation

## 2020-05-27 DIAGNOSIS — E039 Hypothyroidism, unspecified: Secondary | ICD-10-CM | POA: Insufficient documentation

## 2020-05-27 LAB — GLUCOSE, CAPILLARY: Glucose-Capillary: 86 mg/dL (ref 70–99)

## 2020-05-27 MED ORDER — TRAMADOL HCL 50 MG PO TABS
50.0000 mg | ORAL_TABLET | Freq: Once | ORAL | Status: AC
  Administered 2020-05-27: 50 mg via ORAL
  Filled 2020-05-27: qty 1

## 2020-05-27 MED ORDER — ACETAMINOPHEN 325 MG PO TABS
650.0000 mg | ORAL_TABLET | Freq: Once | ORAL | Status: AC
  Administered 2020-05-27: 650 mg via ORAL
  Filled 2020-05-27: qty 2

## 2020-05-27 MED ORDER — TRAMADOL HCL 50 MG PO TABS: 50.0000 mg | ORAL_TABLET | Freq: Four times a day (QID) | ORAL | 0 refills | Status: AC | PRN

## 2020-05-27 NOTE — ED Provider Notes (Signed)
[facility] Emergency Department Provider Note   ____________________________________________   First MD Initiated Contact with Patient 05/27/20 0403     (approximate)  I have reviewed the triage vital signs and the nursing notes.   HISTORY  Chief Complaint Head Injury    HPI Sabrina Small is a 86 y.o. female who presents to the ED from home place status post mechanical fall.  Patient stood up to stretch a cramp in her leg and lost her balance, falling forward.  She struck her face on the ground.  Denies LOC.  She was able to get up afterwards and ambulate.  Denies anticoagulant use other than a baby aspirin daily.  Presents with swelling to her right forehead.  Complains of headache.  Denies vision changes, neck pain, chest pain, shortness of breath, abdominal pain, nausea, vomiting or dizziness.  Tetanus up-to-date.     Past Medical History:  Diagnosis Date  . Allergy   . Arthritis   . Bell's palsy   . CHF (congestive heart failure) ([place])   . Colon polyps   . Depression   . Diabetes mellitus without complication ([place])   . Diabetes mellitus, type II ([place])   . Diverticulitis   . Gout   . Hypothyroidism   . Sleep apnea     Patient Active Problem List   Diagnosis Date Noted  . PAD (peripheral artery disease) ([place]) 05/14/2020  . Abnormal findings on diagnostic imaging of liver and biliary tract 11/22/2018  . Anxiety 11/22/2018  . Acquired hallux valgus 11/22/2018  . Congestive heart failure ([place]) 11/22/2018  . Constipation 11/22/2018  . Diabetic neuropathy ([place]) 11/22/2018  . Drug-induced diabetes mellitus ([place]) 11/22/2018  . Essential hypertension 11/22/2018  . Obesity 11/22/2018  . Major depression, single episode 11/22/2018  . Intertrigo 11/22/2018  . Thrombocytopenia ([place]) 11/22/2018  . Tear film insufficiency 11/22/2018  . Primary osteoarthritis 11/22/2018  . Abdominal distension   . COPD (chronic obstructive pulmonary disease)  ([place]) 09/03/2018  . Hypoglycemia 09/02/2018  . Hyperlipidemia 09/02/2018  . COPD with acute exacerbation ([place]) 09/02/2018  . Tremor of unknown origin 09/02/2018  . Acute diastolic CHF (congestive heart failure) ([place]) 09/02/2018  . Acute respiratory failure with hypoxia ([place]) 09/02/2018  . Insulin overdose   . Venous ulcer of ankle, right ([place]) 07/16/2018  . Chronic venous insufficiency 07/16/2018  . Lymphedema 07/16/2018  . Osteoarthritis of left knee 03/22/2018  . History of total knee arthroplasty 03/22/2018  . Hyperlipidemia due to type 2 diabetes mellitus ([place]) 09/06/2017  . Vitamin D deficiency 09/06/2017  . Chronic ITP (idiopathic thrombocytopenia) ([facility]) 03/22/2017  . Type 2 diabetes mellitus with diabetic foot deformity ([place]) 03/08/2017  . Gout 03/08/2017  . Hypothyroidism 03/08/2017  . Sepsis ([place]) 11/24/2016    Past Surgical History:  Procedure Laterality Date  . ABDOMINAL HYSTERECTOMY    . CHOLECYSTECTOMY    . COLONOSCOPY WITH PROPOFOL N/A 02/04/2017   Procedure: COLONOSCOPY WITH PROPOFOL;  Surgeon: Lollie Sails, MD;  Location: [facility] ENDOSCOPY;  Service: Endoscopy;  Laterality: N/A;  . HERNIA REPAIR      Prior to Admission medications   Medication Sig Start Date End Date Taking? Authorizing Provider  Acetaminophen (TYLENOL EXTRA STRENGTH PO) Take 1 tablet by mouth every 8 (eight) hours as needed (pain).    [provider]  acetaminophen (TYLENOL) 325 MG tablet Take 650 mg by mouth 2 (two) times a week.    [provider]  albuterol (VENTOLIN HFA) 108 (90 Base)  MCG/ACT inhaler Inhale 2 puffs into the lungs every 6 (six) hours as needed for shortness of breath.     [provider]  Albuterol Sulfate 2.5 MG/0.5ML NEBU Inhale 1 each into the lungs in the morning and at bedtime.    [provider]  allopurinol (ZYLOPRIM) 100 MG tablet Take 100 mg by mouth daily.    [provider]  amLODipine (NORVASC) 10 MG tablet Take 10 mg  by mouth daily.    [provider]  aspirin EC 81 MG tablet Take 81 mg by mouth every other day.     [provider]  azelastine (ASTELIN) 0.1 % nasal spray Place 2 sprays into both nostrils 2 (two) times daily as needed (daily and as needed). Use in each nostril as directed     [provider]  BD AUTOSHIELD DUO 30G X 5 MM MISC USE WITH LEVEMIR AND NOVOLOG 12/25/18   Crecencio Mc, MD  bisacodyl (DULCOLAX) 5 MG EC tablet Take 5 mg by mouth 2 (two) times daily as needed for moderate constipation.     [provider]  buPROPion (WELLBUTRIN XL) 150 MG 24 hr tablet Take 150 mg by mouth daily.    [provider]  dicyclomine (BENTYL) 10 MG capsule Take 10 mg by mouth daily as needed for spasms (abdominal pain).    [provider]  DULoxetine (CYMBALTA) 20 MG capsule Take 20 mg by mouth daily.    [provider]  Emollient (GOLD BOND MEDICATED BODY) 5-0.5 % LOTN Apply 1 Dose topically daily.    [provider]  fenofibrate micronized (LOFIBRA) 200 MG capsule Take 200 mg by mouth daily.    [provider]  fluconazole (DIFLUCAN) 150 MG tablet Take 150 mg by mouth every other day.    [provider]  Fluticasone-Salmeterol (ADVAIR DISKUS) 250-50 MCG/DOSE AEPB USE 1 PUFF VIA INHALATION 2 TIMES A DAY    [provider]  fluticasone-salmeterol (ADVAIR HFA) 230-21 MCG/ACT inhaler Inhale 2 puffs into the lungs 2 (two) times daily as needed.     [provider]  fluticasone-salmeterol (ADVAIR HFA) 230-21 MCG/ACT inhaler 2 puffs    [provider]  hydroxypropyl methylcellulose / hypromellose (ISOPTO TEARS / GONIOVISC) 2.5 % ophthalmic solution Place 2 drops into both eyes 2 (two) times daily.    [provider]  hydrOXYzine (ATARAX/VISTARIL) 25 MG tablet Take 25 mg by mouth 3 (three) times daily as needed.    [provider]  insulin aspart (NOVOLOG) 100 UNIT/ML FlexPen Inject 8  Units into the skin 3 (three) times daily before meals. And As needed if BG >200    [provider]  Insulin Detemir (LEVEMIR FLEXTOUCH) 100 UNIT/ML Pen Inject 40 Units into the skin at bedtime.     [provider]  levothyroxine (SYNTHROID, LEVOTHROID) 75 MCG tablet Take 75 mcg by mouth daily before breakfast.     [provider]  linaclotide (LINZESS) 145 MCG CAPS capsule Take 1 capsule (145 mcg total) by mouth daily before breakfast. 09/05/18   Tat, Shanon Brow, MD  liver oil-zinc oxide (DESITIN) 40 % ointment Apply 1 application topically 3 (three) times daily.    [provider]  loperamide (IMODIUM A-D) 2 MG tablet Take 2 mg by mouth every 8 (eight) hours as needed for diarrhea or loose stools.    [provider]  loratadine (CLARITIN) 5 MG chewable tablet Chew 5 mg by mouth daily as needed.  [provider]  meloxicam (MOBIC) 7.5 MG tablet Take 7.5 mg by mouth 2 (two) times a week.    [provider]  metoprolol succinate (TOPROL-XL) 50 MG 24 hr tablet Take 1 tablet (50 mg total) by mouth daily. Take with or immediately following a meal. 09/05/18   Tat, Shanon Brow, MD  nystatin cream (MYCOSTATIN) Apply 1 application topically 2 (two) times daily.    [provider]  Omega-3 Fatty Acids (FISH OIL) 1000 MG CAPS Take 1,000 mg by mouth 2 (two) times daily.     [provider]  omeprazole (PRILOSEC) 20 MG capsule Take 20 mg by mouth daily.    [provider]  Pitavastatin Calcium (LIVALO) 2 MG TABS Take 2 mg by mouth every evening.     [provider]  polyethylene glycol (MIRALAX / GLYCOLAX) packet Take 17 g by mouth daily as needed.     [provider]  polyvinyl alcohol (LIQUIFILM TEARS) 1.4 % ophthalmic solution Place 2 drops into both eyes in the morning and at bedtime.    [provider]  potassium chloride (K-DUR) 10 MEQ tablet Take 10 mEq by mouth 3 (three) times a week.  09/07/18    [provider]  pregabalin (LYRICA) 150 MG capsule Take 1 capsule (150 mg total) by mouth daily. 09/05/18   Orson Eva, MD  senna (SENOKOT) 8.6 MG TABS tablet Take 2 tablets by mouth daily as needed for mild constipation.    [provider]  torsemide (DEMADEX) 20 MG tablet Take 6 tablets (120 mg total) by mouth daily. Patient taking differently: Take 60 mg by mouth daily.  09/05/18   Orson Eva, MD  traMADol (ULTRAM) 50 MG tablet Take 50 mg by mouth 2 (two) times a week.    [provider]  traMADol (ULTRAM) 50 MG tablet Take 50 mg by mouth every 6 (six) hours as needed for moderate pain (and severe pain).    [provider]  traMADol (ULTRAM) 50 MG tablet Take 1 tablet (50 mg total) by mouth every 6 (six) hours as needed. 05/27/20   Paulette Blanch, MD  Vitamin D, Ergocalciferol, (DRISDOL) 50000 units CAPS capsule Take 50,000 Units by mouth every 30 (thirty) days.     [provider]    Allergies Ciprocin-fluocin-procin  [fluocinolone], Doxycycline, Dust mite mixed allergen ext [mite (d. farinae)], Glipizide, Iodinated diagnostic agents, Iodine, Liraglutide, Lortab [hydrocodone-acetaminophen], Morphine and related, Omnicef [cefdinir], Other, Penicillins, Percocet [oxycodone-acetaminophen], Rosuvastatin, Sulfa antibiotics, Sulfamethoxazole-trimethoprim, Sulfasalazine, Trimethoprim, Trovafloxacin, Vancomycin, Voltaren [diclofenac sodium], and Zolpidem  Family History  Adopted: Yes  Problem Relation Age of Onset  . Mental illness Son     Social History Social History   Tobacco Use  . Smoking status: Former Smoker    Packs/day: 2.00    Years: 30.00    Pack years: 60.00    Quit date: 03/08/1985    Years since quitting: 35.2  . Smokeless tobacco: Never Used  Vaping Use  . Vaping Use: Never used  Substance Use Topics  . Alcohol use: No  . Drug use: No    Review of Systems  Constitutional: No fever/chills Eyes: No visual changes. ENT: Positive  for facial bruising.  No sore throat. Cardiovascular: Denies chest pain. Respiratory: Denies shortness of breath. Gastrointestinal: No abdominal pain.  No nausea, no vomiting.  No diarrhea.  No constipation. Genitourinary: Negative for dysuria. Musculoskeletal: Negative for back pain. Skin: Negative for rash. Neurological: Negative for headaches, focal weakness or numbness.  ____________________________________________   PHYSICAL EXAM:  VITAL SIGNS: ED Triage Vitals  Enc Vitals Group     BP 05/27/20 0054 (!) 155/75     Pulse Rate 05/27/20 0054 70     Resp 05/27/20 0054 20     Temp 05/27/20 0054 98.7 F (37.1 C)     Temp Source 05/27/20 0054 Oral     SpO2 05/27/20 0054 93 %     Weight 05/27/20 0055 158 lb (71.7 kg)     Height 05/27/20 0055 4\' 11"  (1.499 m)     Head Circumference --      Peak Flow --      Pain Score 05/27/20 0055 8     Pain Loc --      Pain Edu? --      Excl. in [place]? --     Constitutional: Alert and oriented.  Elderly appearing and in no acute distress. Eyes: Right periorbital hematoma.  No subconjunctival hemorrhage.  No hyphema.  Conjunctivae are normal. PERRL. EOMI. Head: Large right forehead hematoma with overlying minor abrasion without active bleeding. Nose: No deformity. Mouth/Throat: Mucous membranes are moist.  No dental malocclusion. Neck: No stridor.  No cervical spine tenderness to palpation.  No step-offs or deformities. Cardiovascular: Normal rate, regular rhythm. Grossly normal heart sounds.  Good peripheral circulation. Respiratory: Normal respiratory effort.  No retractions. Lungs CTAB. Gastrointestinal: Soft and nontender to light or deep palpation. No distention. No abdominal bruits. No CVA tenderness. Musculoskeletal:  Dorsal left hand with minor skin tear without active bleeding. Old ecchymosis to right hand and forearm. No spinal tenderness to palpation.  Pelvis stable.  No lower extremity tenderness nor edema.  Pre-existing left  lower leg wrapped.  No joint effusions. Neurologic: Alert and oriented x3.  CN II to XII grossly intact.  Normal speech and language. No gross focal neurologic deficits are appreciated. MAEx4. Skin:  Skin is warm, dry and intact. No rash noted. Psychiatric: Mood and affect are normal. Speech and behavior are normal.  ____________________________________________   LABS (all labs ordered are listed, but only abnormal results are displayed)  Labs Reviewed  GLUCOSE, CAPILLARY  CBG MONITORING, ED   ____________________________________________  EKG  None ____________________________________________  RADIOLOGY  ED MD interpretation: No ICH, right frontal scalp hematoma, no acute fracture or dislocation of cervical spine, no maxillofacial fractures  Official radiology report(s): CT Head Wo Contrast  Result Date: 05/27/2020 CLINICAL DATA:  Fall EXAM: CT HEAD WITHOUT CONTRAST CT MAXILLOFACIAL WITHOUT CONTRAST CT CERVICAL SPINE WITHOUT CONTRAST TECHNIQUE: Multidetector CT imaging of the head, cervical spine, and maxillofacial structures were performed using the standard protocol without intravenous contrast. Multiplanar CT image reconstructions of the cervical spine and maxillofacial structures were also generated. COMPARISON:  None. FINDINGS: CT HEAD FINDINGS Brain: There is no mass, hemorrhage or extra-axial collection. The size and configuration of the ventricles and extra-axial CSF spaces are normal. The brain parenchyma is normal, without evidence of acute or chronic infarction. Vascular: Atherosclerotic calcification of the internal carotid arteries at the skull base. No abnormal hyperdensity of the major intracranial arteries or dural venous sinuses. Skull: Large right frontal scalp hematoma.  No skull fracture. CT MAXILLOFACIAL FINDINGS Osseous: --Complex facial fracture types: No LeFort, zygomaticomaxillary complex or nasoorbitoethmoidal fracture. --Simple fracture types: None. --Mandible:  No fracture or dislocation. Orbits: The globes are intact. Normal appearance of the intra- and extraconal fat. Symmetric extraocular muscles and optic nerves. Sinuses: No fluid levels or advanced mucosal thickening. Soft tissues: Normal visualized extracranial soft tissues.  CT CERVICAL SPINE FINDINGS Alignment: Grade 1 anterolisthesis at C4-5 and C5-6 secondary to facet arthrosis. Facets are aligned. Occipital condyles and the lateral masses of C1-C2 are aligned. Skull base and vertebrae: No acute fracture. Soft tissues and spinal canal: No prevertebral fluid or swelling. No visible canal hematoma. Disc levels: Multilevel facet arthrosis. Upper chest: No pneumothorax, pulmonary nodule or pleural effusion. Other: Normal visualized paraspinal cervical soft tissues. IMPRESSION: 1. No acute intracranial abnormality. 2. Large right frontal scalp hematoma without skull fracture or facial fracture. 3. No acute fracture or static subluxation of the cervical spine. Electronically Signed   By: Ulyses Jarred M.D.   On: 05/27/2020 01:45   CT CERVICAL SPINE WO CONTRAST  Result Date: 05/27/2020 CLINICAL DATA:  Fall EXAM: CT HEAD WITHOUT CONTRAST CT MAXILLOFACIAL WITHOUT CONTRAST CT CERVICAL SPINE WITHOUT CONTRAST TECHNIQUE: Multidetector CT imaging of the head, cervical spine, and maxillofacial structures were performed using the standard protocol without intravenous contrast. Multiplanar CT image reconstructions of the cervical spine and maxillofacial structures were also generated. COMPARISON:  None. FINDINGS: CT HEAD FINDINGS Brain: There is no mass, hemorrhage or extra-axial collection. The size and configuration of the ventricles and extra-axial CSF spaces are normal. The brain parenchyma is normal, without evidence of acute or chronic infarction. Vascular: Atherosclerotic calcification of the internal carotid arteries at the skull base. No abnormal hyperdensity of the major intracranial arteries or dural venous sinuses.  Skull: Large right frontal scalp hematoma.  No skull fracture. CT MAXILLOFACIAL FINDINGS Osseous: --Complex facial fracture types: No LeFort, zygomaticomaxillary complex or nasoorbitoethmoidal fracture. --Simple fracture types: None. --Mandible: No fracture or dislocation. Orbits: The globes are intact. Normal appearance of the intra- and extraconal fat. Symmetric extraocular muscles and optic nerves. Sinuses: No fluid levels or advanced mucosal thickening. Soft tissues: Normal visualized extracranial soft tissues. CT CERVICAL SPINE FINDINGS Alignment: Grade 1 anterolisthesis at C4-5 and C5-6 secondary to facet arthrosis. Facets are aligned. Occipital condyles and the lateral masses of C1-C2 are aligned. Skull base and vertebrae: No acute fracture. Soft tissues and spinal canal: No prevertebral fluid or swelling. No visible canal hematoma. Disc levels: Multilevel facet arthrosis. Upper chest: No pneumothorax, pulmonary nodule or pleural effusion. Other: Normal visualized paraspinal cervical soft tissues. IMPRESSION: 1. No acute intracranial abnormality. 2. Large right frontal scalp hematoma without skull fracture or facial fracture. 3. No acute fracture or static subluxation of the cervical spine. Electronically Signed   By: Ulyses Jarred M.D.   On: 05/27/2020 01:45   CT MAXILLOFACIAL WO CONTRAST  Result Date: 05/27/2020 CLINICAL DATA:  Fall EXAM: CT HEAD WITHOUT CONTRAST CT MAXILLOFACIAL WITHOUT CONTRAST CT CERVICAL SPINE WITHOUT CONTRAST TECHNIQUE: Multidetector CT imaging of the head, cervical spine, and maxillofacial structures were performed using the standard protocol without intravenous contrast. Multiplanar CT image reconstructions of the cervical spine and maxillofacial structures were also generated. COMPARISON:  None. FINDINGS: CT HEAD FINDINGS Brain: There is no mass, hemorrhage or extra-axial collection. The size and configuration of the ventricles and extra-axial CSF spaces are normal. The brain  parenchyma is normal, without evidence of acute or chronic infarction. Vascular: Atherosclerotic calcification of the internal carotid arteries at the skull base. No abnormal hyperdensity of the major intracranial arteries or dural venous sinuses. Skull: Large right frontal scalp hematoma.  No skull fracture. CT MAXILLOFACIAL FINDINGS Osseous: --Complex facial fracture types: No LeFort, zygomaticomaxillary complex or nasoorbitoethmoidal fracture. --Simple fracture types: None. --Mandible: No fracture or dislocation. Orbits: The globes are intact. Normal appearance of the intra- and  extraconal fat. Symmetric extraocular muscles and optic nerves. Sinuses: No fluid levels or advanced mucosal thickening. Soft tissues: Normal visualized extracranial soft tissues. CT CERVICAL SPINE FINDINGS Alignment: Grade 1 anterolisthesis at C4-5 and C5-6 secondary to facet arthrosis. Facets are aligned. Occipital condyles and the lateral masses of C1-C2 are aligned. Skull base and vertebrae: No acute fracture. Soft tissues and spinal canal: No prevertebral fluid or swelling. No visible canal hematoma. Disc levels: Multilevel facet arthrosis. Upper chest: No pneumothorax, pulmonary nodule or pleural effusion. Other: Normal visualized paraspinal cervical soft tissues. IMPRESSION: 1. No acute intracranial abnormality. 2. Large right frontal scalp hematoma without skull fracture or facial fracture. 3. No acute fracture or static subluxation of the cervical spine. Electronically Signed   By: Ulyses Jarred M.D.   On: 05/27/2020 01:45    ____________________________________________   PROCEDURES  Procedure(s) performed (including Critical Care):  Procedures   ____________________________________________   INITIAL IMPRESSION / ASSESSMENT AND PLAN / ED COURSE  As part of my medical decision making, I reviewed the following data within the [facility] notes reviewed and incorporated, Labs reviewed,  Radiograph reviewed and Notes from prior ED visits     AARUSHI HEMRIC was evaluated in Emergency Department on 05/27/2020 for the symptoms described in the history of present illness. She was evaluated in the context of the global COVID-19 pandemic, which necessitated consideration that the patient might be at risk for infection with the SARS-CoV-2 virus that causes COVID-19. Institutional protocols and algorithms that pertain to the evaluation of patients at risk for COVID-19 are in a state of rapid change based on information released by regulatory bodies including the CDC and federal and state organizations. These policies and algorithms were followed during the patient's care in the ED.    86 year old female who presents with facial hematoma status post mechanical fall.  Differential diagnosis includes but is not limited to [place], cervical spine injury, maxillofacial fractures, etc.  CT imaging of head/C-spine/maxillofacial demonstrates no ICH, no C-spine injury, no maxillofacial fracture.  Will place pressure dressing to right forehead, administer Tylenol.  Dress skin tear on left hand.  Will discharge back to home place with close follow-up with her PCP.  Strict return precautions given.  Patient verbalizes understanding agrees with plan of care.      ____________________________________________   FINAL CLINICAL IMPRESSION(S) / ED DIAGNOSES  Final diagnoses:  Fall, initial encounter  Injury of head, initial encounter  Facial contusion, initial encounter  Traumatic hematoma of forehead, initial encounter     ED Discharge Orders         Ordered    traMADol (ULTRAM) 50 MG tablet  Every 6 hours PRN     Discontinue  Reprint     05/27/20 0445           Note:  This document was prepared using Dragon voice recognition software and may include unintentional dictation errors.   Paulette Blanch, MD 05/27/20 [phone]

## 2020-05-27 NOTE — ED Triage Notes (Signed)
Pt to ED after standing to stretch a cramp in her leg and falling. Pt hit her face on the ground but no LOC. Significant swelling noted to right forehead. Wound is weeping but no laceration at this time. No changes in vision.

## 2020-05-27 NOTE — Discharge Instructions (Addendum)
You may take Tylenol as needed for pain, Tramadol for more severe pain.  Apply ice to affected area several times daily to reduce swelling.  Return to the ER for worsening symptoms, persistent vomiting, lethargy or other concerns.

## 2020-05-28 ENCOUNTER — Encounter: Payer: Self-pay | Admitting: Emergency Medicine

## 2020-05-28 ENCOUNTER — Inpatient Hospital Stay
Admission: AD | Admit: 2020-05-28 | Discharge: 2020-06-05 | DRG: 560 | Disposition: E | Payer: Medicare Other | Source: Skilled Nursing Facility | Attending: Internal Medicine | Admitting: Internal Medicine

## 2020-05-28 ENCOUNTER — Other Ambulatory Visit: Payer: Self-pay

## 2020-05-28 ENCOUNTER — Emergency Department: Payer: Medicare Other

## 2020-05-28 DIAGNOSIS — N184 Chronic kidney disease, stage 4 (severe): Secondary | ICD-10-CM | POA: Diagnosis present

## 2020-05-28 DIAGNOSIS — Z79899 Other long term (current) drug therapy: Secondary | ICD-10-CM

## 2020-05-28 DIAGNOSIS — R600 Localized edema: Secondary | ICD-10-CM

## 2020-05-28 DIAGNOSIS — Z20822 Contact with and (suspected) exposure to covid-19: Secondary | ICD-10-CM | POA: Diagnosis present

## 2020-05-28 DIAGNOSIS — M1990 Unspecified osteoarthritis, unspecified site: Secondary | ICD-10-CM | POA: Diagnosis present

## 2020-05-28 DIAGNOSIS — S0993XA Unspecified injury of face, initial encounter: Secondary | ICD-10-CM

## 2020-05-28 DIAGNOSIS — Z87891 Personal history of nicotine dependence: Secondary | ICD-10-CM

## 2020-05-28 DIAGNOSIS — Z791 Long term (current) use of non-steroidal anti-inflammatories (NSAID): Secondary | ICD-10-CM

## 2020-05-28 DIAGNOSIS — E1169 Type 2 diabetes mellitus with other specified complication: Secondary | ICD-10-CM | POA: Diagnosis present

## 2020-05-28 DIAGNOSIS — H1131 Conjunctival hemorrhage, right eye: Secondary | ICD-10-CM | POA: Diagnosis present

## 2020-05-28 DIAGNOSIS — L8990 Pressure ulcer of unspecified site, unspecified stage: Secondary | ICD-10-CM | POA: Insufficient documentation

## 2020-05-28 DIAGNOSIS — R0602 Shortness of breath: Secondary | ICD-10-CM

## 2020-05-28 DIAGNOSIS — G510 Bell's palsy: Secondary | ICD-10-CM | POA: Diagnosis present

## 2020-05-28 DIAGNOSIS — E559 Vitamin D deficiency, unspecified: Secondary | ICD-10-CM | POA: Diagnosis present

## 2020-05-28 DIAGNOSIS — S0993XD Unspecified injury of face, subsequent encounter: Secondary | ICD-10-CM

## 2020-05-28 DIAGNOSIS — Z09 Encounter for follow-up examination after completed treatment for conditions other than malignant neoplasm: Secondary | ICD-10-CM

## 2020-05-28 DIAGNOSIS — S82891A Other fracture of right lower leg, initial encounter for closed fracture: Secondary | ICD-10-CM | POA: Diagnosis present

## 2020-05-28 DIAGNOSIS — L89152 Pressure ulcer of sacral region, stage 2: Secondary | ICD-10-CM | POA: Diagnosis present

## 2020-05-28 DIAGNOSIS — S0990XA Unspecified injury of head, initial encounter: Secondary | ICD-10-CM

## 2020-05-28 DIAGNOSIS — Z515 Encounter for palliative care: Secondary | ICD-10-CM | POA: Diagnosis not present

## 2020-05-28 DIAGNOSIS — W010XXA Fall on same level from slipping, tripping and stumbling without subsequent striking against object, initial encounter: Secondary | ICD-10-CM | POA: Diagnosis present

## 2020-05-28 DIAGNOSIS — E039 Hypothyroidism, unspecified: Secondary | ICD-10-CM | POA: Diagnosis present

## 2020-05-28 DIAGNOSIS — G4730 Sleep apnea, unspecified: Secondary | ICD-10-CM | POA: Diagnosis present

## 2020-05-28 DIAGNOSIS — S61412A Laceration without foreign body of left hand, initial encounter: Secondary | ICD-10-CM | POA: Diagnosis present

## 2020-05-28 DIAGNOSIS — E1140 Type 2 diabetes mellitus with diabetic neuropathy, unspecified: Secondary | ICD-10-CM | POA: Diagnosis present

## 2020-05-28 DIAGNOSIS — F329 Major depressive disorder, single episode, unspecified: Secondary | ICD-10-CM | POA: Diagnosis present

## 2020-05-28 DIAGNOSIS — Z66 Do not resuscitate: Secondary | ICD-10-CM | POA: Diagnosis present

## 2020-05-28 DIAGNOSIS — S92154G Nondisplaced avulsion fracture (chip fracture) of right talus, subsequent encounter for fracture with delayed healing: Secondary | ICD-10-CM | POA: Diagnosis not present

## 2020-05-28 DIAGNOSIS — Z9181 History of falling: Secondary | ICD-10-CM

## 2020-05-28 DIAGNOSIS — W19XXXA Unspecified fall, initial encounter: Secondary | ICD-10-CM

## 2020-05-28 DIAGNOSIS — Y9389 Activity, other specified: Secondary | ICD-10-CM

## 2020-05-28 DIAGNOSIS — N179 Acute kidney failure, unspecified: Secondary | ICD-10-CM | POA: Diagnosis not present

## 2020-05-28 DIAGNOSIS — E876 Hypokalemia: Secondary | ICD-10-CM | POA: Diagnosis present

## 2020-05-28 DIAGNOSIS — Z818 Family history of other mental and behavioral disorders: Secondary | ICD-10-CM

## 2020-05-28 DIAGNOSIS — N189 Chronic kidney disease, unspecified: Secondary | ICD-10-CM | POA: Diagnosis not present

## 2020-05-28 DIAGNOSIS — E6609 Other obesity due to excess calories: Secondary | ICD-10-CM | POA: Diagnosis present

## 2020-05-28 DIAGNOSIS — Z681 Body mass index (BMI) 19 or less, adult: Secondary | ICD-10-CM | POA: Diagnosis not present

## 2020-05-28 DIAGNOSIS — W1839XD Other fall on same level, subsequent encounter: Secondary | ICD-10-CM

## 2020-05-28 DIAGNOSIS — I5032 Chronic diastolic (congestive) heart failure: Secondary | ICD-10-CM

## 2020-05-28 DIAGNOSIS — I130 Hypertensive heart and chronic kidney disease with heart failure and stage 1 through stage 4 chronic kidney disease, or unspecified chronic kidney disease: Secondary | ICD-10-CM | POA: Diagnosis present

## 2020-05-28 DIAGNOSIS — S51812A Laceration without foreign body of left forearm, initial encounter: Secondary | ICD-10-CM | POA: Diagnosis present

## 2020-05-28 DIAGNOSIS — Z79890 Hormone replacement therapy (postmenopausal): Secondary | ICD-10-CM

## 2020-05-28 DIAGNOSIS — Z7982 Long term (current) use of aspirin: Secondary | ICD-10-CM

## 2020-05-28 DIAGNOSIS — S0181XA Laceration without foreign body of other part of head, initial encounter: Secondary | ICD-10-CM | POA: Diagnosis present

## 2020-05-28 DIAGNOSIS — Z91048 Other nonmedicinal substance allergy status: Secondary | ICD-10-CM

## 2020-05-28 DIAGNOSIS — E785 Hyperlipidemia, unspecified: Secondary | ICD-10-CM | POA: Diagnosis present

## 2020-05-28 DIAGNOSIS — I10 Essential (primary) hypertension: Secondary | ICD-10-CM | POA: Diagnosis present

## 2020-05-28 DIAGNOSIS — S0083XA Contusion of other part of head, initial encounter: Secondary | ICD-10-CM | POA: Diagnosis present

## 2020-05-28 DIAGNOSIS — J449 Chronic obstructive pulmonary disease, unspecified: Secondary | ICD-10-CM | POA: Diagnosis present

## 2020-05-28 DIAGNOSIS — D693 Immune thrombocytopenic purpura: Secondary | ICD-10-CM | POA: Diagnosis present

## 2020-05-28 DIAGNOSIS — H0220C Unspecified lagophthalmos, bilateral, upper and lower eyelids: Secondary | ICD-10-CM | POA: Diagnosis present

## 2020-05-28 DIAGNOSIS — Y92019 Unspecified place in single-family (private) house as the place of occurrence of the external cause: Secondary | ICD-10-CM

## 2020-05-28 DIAGNOSIS — Z7951 Long term (current) use of inhaled steroids: Secondary | ICD-10-CM

## 2020-05-28 DIAGNOSIS — S60212A Contusion of left wrist, initial encounter: Secondary | ICD-10-CM

## 2020-05-28 DIAGNOSIS — R296 Repeated falls: Secondary | ICD-10-CM | POA: Diagnosis not present

## 2020-05-28 DIAGNOSIS — Z880 Allergy status to penicillin: Secondary | ICD-10-CM

## 2020-05-28 DIAGNOSIS — Z885 Allergy status to narcotic agent status: Secondary | ICD-10-CM

## 2020-05-28 DIAGNOSIS — M109 Gout, unspecified: Secondary | ICD-10-CM | POA: Diagnosis present

## 2020-05-28 DIAGNOSIS — I872 Venous insufficiency (chronic) (peripheral): Secondary | ICD-10-CM | POA: Diagnosis present

## 2020-05-28 DIAGNOSIS — E1151 Type 2 diabetes mellitus with diabetic peripheral angiopathy without gangrene: Secondary | ICD-10-CM | POA: Diagnosis present

## 2020-05-28 DIAGNOSIS — Z91041 Radiographic dye allergy status: Secondary | ICD-10-CM

## 2020-05-28 DIAGNOSIS — Z794 Long term (current) use of insulin: Secondary | ICD-10-CM

## 2020-05-28 DIAGNOSIS — F419 Anxiety disorder, unspecified: Secondary | ICD-10-CM | POA: Diagnosis present

## 2020-05-28 DIAGNOSIS — Z882 Allergy status to sulfonamides status: Secondary | ICD-10-CM

## 2020-05-28 DIAGNOSIS — Z881 Allergy status to other antibiotic agents status: Secondary | ICD-10-CM

## 2020-05-28 DIAGNOSIS — Z888 Allergy status to other drugs, medicaments and biological substances status: Secondary | ICD-10-CM

## 2020-05-28 LAB — CBC WITH DIFFERENTIAL/PLATELET
Abs Immature Granulocytes: 0.03 10*3/uL (ref 0.00–0.07)
Basophils Absolute: 0 10*3/uL (ref 0.0–0.1)
Basophils Relative: 1 %
Eosinophils Absolute: 0.2 10*3/uL (ref 0.0–0.5)
Eosinophils Relative: 4 %
HCT: 36.9 % (ref 36.0–46.0)
Hemoglobin: 12.7 g/dL (ref 12.0–15.0)
Immature Granulocytes: 1 %
Lymphocytes Relative: 30 %
Lymphs Abs: 1.6 10*3/uL (ref 0.7–4.0)
MCH: 31.5 pg (ref 26.0–34.0)
MCHC: 34.4 g/dL (ref 30.0–36.0)
MCV: 91.6 fL (ref 80.0–100.0)
Monocytes Absolute: 0.3 10*3/uL (ref 0.1–1.0)
Monocytes Relative: 6 %
Neutro Abs: 3.1 10*3/uL (ref 1.7–7.7)
Neutrophils Relative %: 58 %
Platelets: 122 10*3/uL — ABNORMAL LOW (ref 150–400)
RBC: 4.03 MIL/uL (ref 3.87–5.11)
RDW: 15 % (ref 11.5–15.5)
WBC: 5.3 10*3/uL (ref 4.0–10.5)
nRBC: 0 % (ref 0.0–0.2)

## 2020-05-28 LAB — COMPREHENSIVE METABOLIC PANEL
ALT: 16 U/L (ref 0–44)
AST: 29 U/L (ref 15–41)
Albumin: 4 g/dL (ref 3.5–5.0)
Alkaline Phosphatase: 62 U/L (ref 38–126)
Anion gap: 11 (ref 5–15)
BUN: 32 mg/dL — ABNORMAL HIGH (ref 8–23)
CO2: 29 mmol/L (ref 22–32)
Calcium: 9.2 mg/dL (ref 8.9–10.3)
Chloride: 99 mmol/L (ref 98–111)
Creatinine, Ser: 1.53 mg/dL — ABNORMAL HIGH (ref 0.44–1.00)
GFR calc Af Amer: 36 mL/min — ABNORMAL LOW (ref >60)
GFR calc non Af Amer: 31 mL/min — ABNORMAL LOW (ref >60)
Glucose, Bld: 117 mg/dL — ABNORMAL HIGH (ref 70–99)
Potassium: 3.3 mmol/L — ABNORMAL LOW (ref 3.5–5.1)
Sodium: 139 mmol/L (ref 135–145)
Total Bilirubin: 1 mg/dL (ref 0.3–1.2)
Total Protein: 6.8 g/dL (ref 6.5–8.1)

## 2020-05-28 LAB — GLUCOSE, CAPILLARY
Glucose-Capillary: 99 mg/dL (ref 70–99)
Glucose-Capillary: 140 mg/dL — ABNORMAL HIGH (ref 70–99)

## 2020-05-28 LAB — PROTIME-INR
INR: 1.2 (ref 0.8–1.2)
Prothrombin Time: 14.3 seconds (ref 11.4–15.2)

## 2020-05-28 LAB — CK: Total CK: 109 U/L (ref 38–234)

## 2020-05-28 LAB — LACTIC ACID, PLASMA: Lactic Acid, Venous: 1.5 mmol/L (ref 0.5–1.9)

## 2020-05-28 LAB — SARS CORONAVIRUS 2 BY RT PCR (HOSPITAL ORDER, PERFORMED IN ~~LOC~~ HOSPITAL LAB): SARS Coronavirus 2: NEGATIVE

## 2020-05-28 LAB — PROCALCITONIN: Procalcitonin: <0.1 ng/mL

## 2020-05-28 MED ORDER — LEVOTHYROXINE SODIUM 50 MCG PO TABS
75.0000 ug | ORAL_TABLET | Freq: Every day | ORAL | Status: DC
  Administered 2020-05-28 – 2020-05-31 (×3): 75 ug via ORAL
  Filled 2020-05-28 (×3): qty 2

## 2020-05-28 MED ORDER — POTASSIUM CHLORIDE CRYS ER 20 MEQ PO TBCR
40.0000 meq | EXTENDED_RELEASE_TABLET | Freq: Once | ORAL | Status: AC
  Administered 2020-05-28: 40 meq via ORAL
  Filled 2020-05-28: qty 2

## 2020-05-28 MED ORDER — LORATADINE 10 MG PO TABS: 5.0000 mg | ORAL_TABLET | Freq: Every day | ORAL | Status: DC | PRN

## 2020-05-28 MED ORDER — MOMETASONE FURO-FORMOTEROL FUM 200-5 MCG/ACT IN AERO
2.0000 | INHALATION_SPRAY | Freq: Two times a day (BID) | RESPIRATORY_TRACT | Status: DC
  Administered 2020-05-29 – 2020-05-31 (×5): 2 via RESPIRATORY_TRACT
  Filled 2020-05-28: qty 8.8

## 2020-05-28 MED ORDER — LIDOCAINE-EPINEPHRINE 2 %-1:100000 IJ SOLN
30.0000 mL | Freq: Once | INTRAMUSCULAR | Status: AC
  Administered 2020-05-28: 30 mL via INTRADERMAL
  Filled 2020-05-28: qty 2

## 2020-05-28 MED ORDER — ONDANSETRON HCL 4 MG PO TABS: 4.0000 mg | ORAL_TABLET | Freq: Four times a day (QID) | ORAL | Status: DC | PRN

## 2020-05-28 MED ORDER — DULOXETINE HCL 20 MG PO CPEP
20.0000 mg | ORAL_CAPSULE | Freq: Every day | ORAL | Status: DC
  Administered 2020-05-28 – 2020-05-31 (×4): 20 mg via ORAL
  Filled 2020-05-28 (×4): qty 1

## 2020-05-28 MED ORDER — MOMETASONE FURO-FORMOTEROL FUM 200-5 MCG/ACT IN AERO: 2.0000 | INHALATION_SPRAY | Freq: Two times a day (BID) | RESPIRATORY_TRACT | Status: DC

## 2020-05-28 MED ORDER — VITAMIN D (ERGOCALCIFEROL) 1.25 MG (50000 UNIT) PO CAPS: 50000.0000 [IU] | ORAL_CAPSULE | ORAL | Status: DC

## 2020-05-28 MED ORDER — DICYCLOMINE HCL 10 MG PO CAPS
10.0000 mg | ORAL_CAPSULE | Freq: Every day | ORAL | Status: DC | PRN
  Filled 2020-05-28: qty 1

## 2020-05-28 MED ORDER — PANTOPRAZOLE SODIUM 40 MG PO TBEC
40.0000 mg | DELAYED_RELEASE_TABLET | Freq: Every day | ORAL | Status: DC
  Administered 2020-05-28 – 2020-05-31 (×4): 40 mg via ORAL
  Filled 2020-05-28 (×4): qty 1

## 2020-05-28 MED ORDER — PRAVASTATIN SODIUM 20 MG PO TABS
40.0000 mg | ORAL_TABLET | Freq: Every day | ORAL | Status: DC
  Administered 2020-05-29 – 2020-05-30 (×2): 40 mg via ORAL
  Filled 2020-05-28 (×2): qty 2

## 2020-05-28 MED ORDER — FENOFIBRATE 54 MG PO TABS
54.0000 mg | ORAL_TABLET | Freq: Every day | ORAL | Status: DC
  Administered 2020-05-28 – 2020-05-31 (×4): 54 mg via ORAL
  Filled 2020-05-28 (×4): qty 1

## 2020-05-28 MED ORDER — ACETAMINOPHEN 325 MG PO TABS
650.0000 mg | ORAL_TABLET | Freq: Four times a day (QID) | ORAL | Status: DC | PRN
  Administered 2020-05-28 – 2020-05-30 (×3): 650 mg via ORAL
  Filled 2020-05-28 (×3): qty 2

## 2020-05-28 MED ORDER — INSULIN ASPART 100 UNIT/ML ~~LOC~~ SOLN
0.0000 [IU] | Freq: Three times a day (TID) | SUBCUTANEOUS | Status: DC
  Administered 2020-05-29 – 2020-05-30 (×5): 3 [IU] via SUBCUTANEOUS
  Administered 2020-05-30: 5 [IU] via SUBCUTANEOUS
  Administered 2020-05-31 (×2): 3 [IU] via SUBCUTANEOUS
  Filled 2020-05-28 (×8): qty 1

## 2020-05-28 MED ORDER — BUPROPION HCL ER (XL) 150 MG PO TB24
150.0000 mg | ORAL_TABLET | Freq: Every day | ORAL | Status: DC
  Administered 2020-05-28 – 2020-05-31 (×4): 150 mg via ORAL
  Filled 2020-05-28 (×4): qty 1

## 2020-05-28 MED ORDER — BISACODYL 5 MG PO TBEC: 5.0000 mg | DELAYED_RELEASE_TABLET | Freq: Two times a day (BID) | ORAL | Status: DC | PRN

## 2020-05-28 MED ORDER — AMLODIPINE BESYLATE 10 MG PO TABS
10.0000 mg | ORAL_TABLET | Freq: Every day | ORAL | Status: DC
  Administered 2020-05-28 – 2020-05-30 (×3): 10 mg via ORAL
  Filled 2020-05-28 (×3): qty 1
  Filled 2020-05-28: qty 2

## 2020-05-28 MED ORDER — SODIUM CHLORIDE 0.9% FLUSH: 3.0000 mL | INTRAVENOUS | Status: DC | PRN

## 2020-05-28 MED ORDER — SENNA 8.6 MG PO TABS
2.0000 | ORAL_TABLET | Freq: Every day | ORAL | Status: DC
  Administered 2020-05-28 – 2020-05-31 (×4): 17.2 mg via ORAL
  Filled 2020-05-28 (×4): qty 2

## 2020-05-28 MED ORDER — SODIUM CHLORIDE 0.9 % IV SOLN: 250.0000 mL | INTRAVENOUS | Status: DC | PRN

## 2020-05-28 MED ORDER — VITAMIN D (ERGOCALCIFEROL) 1.25 MG (50000 UNIT) PO CAPS
50000.0000 [IU] | ORAL_CAPSULE | ORAL | Status: DC
  Filled 2020-05-28: qty 1

## 2020-05-28 MED ORDER — AZELASTINE HCL 0.1 % NA SOLN
2.0000 | Freq: Two times a day (BID) | NASAL | Status: DC | PRN
  Filled 2020-05-28: qty 30

## 2020-05-28 MED ORDER — ALLOPURINOL 100 MG PO TABS
100.0000 mg | ORAL_TABLET | Freq: Every day | ORAL | Status: DC
  Administered 2020-05-28 – 2020-05-31 (×4): 100 mg via ORAL
  Filled 2020-05-28 (×5): qty 1

## 2020-05-28 MED ORDER — LINACLOTIDE 145 MCG PO CAPS
145.0000 ug | ORAL_CAPSULE | Freq: Every day | ORAL | Status: DC
  Administered 2020-05-28 – 2020-05-31 (×4): 145 ug via ORAL
  Filled 2020-05-28 (×5): qty 1

## 2020-05-28 MED ORDER — SODIUM CHLORIDE 0.9% FLUSH
3.0000 mL | Freq: Two times a day (BID) | INTRAVENOUS | Status: DC
  Administered 2020-05-29 – 2020-05-31 (×5): 3 mL via INTRAVENOUS

## 2020-05-28 MED ORDER — NYSTATIN 100000 UNIT/GM EX CREA
1.0000 "application " | TOPICAL_CREAM | Freq: Two times a day (BID) | CUTANEOUS | Status: DC
  Administered 2020-05-29 – 2020-05-31 (×5): 1 via TOPICAL
  Filled 2020-05-28: qty 15

## 2020-05-28 MED ORDER — TORSEMIDE 20 MG PO TABS
60.0000 mg | ORAL_TABLET | Freq: Every day | ORAL | Status: DC
  Administered 2020-05-28 – 2020-05-31 (×4): 60 mg via ORAL
  Filled 2020-05-28 (×4): qty 3

## 2020-05-28 MED ORDER — ACETAMINOPHEN 650 MG RE SUPP: 650.0000 mg | Freq: Four times a day (QID) | RECTAL | Status: DC | PRN

## 2020-05-28 MED ORDER — OMEGA-3-ACID ETHYL ESTERS 1 G PO CAPS
1.0000 g | ORAL_CAPSULE | Freq: Every day | ORAL | Status: DC
  Administered 2020-05-28 – 2020-05-31 (×4): 1 g via ORAL
  Filled 2020-05-28 (×5): qty 1

## 2020-05-28 MED ORDER — MORPHINE SULFATE (PF) 2 MG/ML IV SOLN
2.0000 mg | INTRAVENOUS | Status: DC | PRN
  Administered 2020-05-29 – 2020-05-30 (×6): 2 mg via INTRAVENOUS
  Filled 2020-05-28 (×7): qty 1

## 2020-05-28 MED ORDER — METOPROLOL SUCCINATE ER 50 MG PO TB24
50.0000 mg | ORAL_TABLET | Freq: Every day | ORAL | Status: DC
  Administered 2020-05-28 – 2020-05-31 (×4): 50 mg via ORAL
  Filled 2020-05-28 (×4): qty 1

## 2020-05-28 MED ORDER — ONDANSETRON HCL 4 MG/2ML IJ SOLN
4.0000 mg | Freq: Four times a day (QID) | INTRAMUSCULAR | Status: DC | PRN
  Administered 2020-05-30: 4 mg via INTRAVENOUS
  Filled 2020-05-28: qty 2

## 2020-05-28 NOTE — ED Notes (Signed)
Admit MD messaged in regards to pt eating and some orders for pain medication. Will wait for response

## 2020-05-28 NOTE — H&P (Signed)
History and Physical    GER NICKS KYH:[id number] DOB: [birth year] DOA: 05/16/2020  PCP: Orvis Brill, [facility]   Patient coming from: Skilled nursing facility  I have personally briefly reviewed patient's old medical records in [place]  Chief Complaint: Fall  HPI: Sabrina Small is a 86 y.o. female with medical history significant for congestive heart failure, diabetes mellitus, arthritis, depression and diverticulitis who was brought into the emergency room by EMS for evaluation after a fall.  Patient was seen in the emergency room 24 hours prior for a mechanical fall and at that time had a skin tear to her left wrist and a contusion on her forehead.  However since that time she has developed severe swelling and bruising all around her forehead and eyes to the point that her eyes are swollen shut.  Patient states that she got up to go to the bathroom and was trying to reach for her walker when she fell.  She is not certain what she hit but has a large laceration on her forehead.  She does not believe she lost consciousness and denies having any dizziness or lightheadedness.  She complains of pain in her right ankle and left wrist. She denies having any chest pain, shortness of breath, nausea, vomiting, dizziness or lightheadedness. Right ankle x-ray shows findings suspicious for acute dorsal talar avulsion fracture. No other acute fracture of the right ankle. CT scan of cervical spine shows persistent large right frontal scalp hematoma without skull Fracture. Right periorbital laceration/soft tissue swelling without intraorbital abnormality. Increased bilateral facial edema. No facial fracture. Twelve-lead EKG shows normal sinus rhythm   ED Course: Patient is an 86 year old Caucasian female who was seen in the emergency room twice within 24 hours for evaluation of mechanical falls.  Patient now has a right ankle fracture and significant facial swelling.  She will be admitted  to the hospital for further evaluation.  Review of Systems: As per HPI otherwise 10 point review of systems negative.    Past Medical History:  Diagnosis Date   Allergy    Arthritis    Bell's palsy    CHF (congestive heart failure) (HCC)    Colon polyps    Depression    Diabetes mellitus without complication ([place])    Diabetes mellitus, type II ([place])    Diverticulitis    Gout    Hypothyroidism    Sleep apnea     Past Surgical History:  Procedure Laterality Date   ABDOMINAL HYSTERECTOMY     CHOLECYSTECTOMY     COLONOSCOPY WITH PROPOFOL N/A 02/04/2017   Procedure: COLONOSCOPY WITH PROPOFOL;  Surgeon: Lollie Sails, MD;  Location: [facility] ENDOSCOPY;  Service: Endoscopy;  Laterality: N/A;   HERNIA REPAIR       reports that she quit smoking about 35 years ago. She has a 60.00 pack-year smoking history. She has never used smokeless tobacco. She reports that she does not drink alcohol and does not use drugs.  Allergies  Allergen Reactions   Ciprocin-Fluocin-Procin  [Fluocinolone] Other (See Comments)   Doxycycline     Patient cannot recall details of reaction   Dust Mite Mixed Allergen Ext [Mite (D. Farinae)]    Glipizide    Iodinated Diagnostic Agents    Iodine Other (See Comments)   Liraglutide Other (See Comments)   Lortab [Hydrocodone-Acetaminophen]    Morphine And Related    Omnicef [Cefdinir] Other (See Comments)    Patient cannot recall details of reaction   Other  Cat dander   Penicillins Other (See Comments)    Has patient had a PCN reaction causing immediate rash, facial/tongue/throat swelling, SOB or lightheadedness with hypotension: Unknown Has patient had a PCN reaction causing severe rash involving mucus membranes or skin necrosis: Unknown Has patient had a PCN reaction that required hospitalization: Unknown Has patient had a PCN reaction occurring within the last 10 years: No If all of the above answers are "NO", then may  proceed with Cephalosporin use. "I blow up and pass out" Over 10 yrs ago.   Percocet [Oxycodone-Acetaminophen] Other (See Comments)   Rosuvastatin    Sulfa Antibiotics Other (See Comments)    Patient cannot recall details of reaction   Sulfamethoxazole-Trimethoprim     Other reaction(s): Unknown Other reaction(s): Unknown    Sulfasalazine     Other reaction(s): Other (See Comments) Patient cannot recall details of reaction   Trimethoprim    Trovafloxacin     Patient cannot recall details of reaction   Vancomycin Other (See Comments)   Voltaren [Diclofenac Sodium] Other (See Comments)   Zolpidem     Family History  Adopted: Yes  Problem Relation Age of Onset   Mental illness Son      Prior to Admission medications   Medication Sig Start Date End Date Taking? Authorizing Provider  Acetaminophen (TYLENOL EXTRA STRENGTH PO) Take 2 tablets by mouth 2 (two) times a week. In the morning every Monday and Wednesday for pain   Yes [provider]  albuterol (VENTOLIN HFA) 108 (90 Base) MCG/ACT inhaler Inhale 2 puffs into the lungs every 6 (six) hours as needed for shortness of breath.    Yes [provider]  Albuterol Sulfate 2.5 MG/0.5ML NEBU Inhale 1 each into the lungs in the morning and at bedtime.   Yes [provider]  allopurinol (ZYLOPRIM) 100 MG tablet Take 100 mg by mouth daily.   Yes [provider]  amLODipine (NORVASC) 10 MG tablet Take 10 mg by mouth daily.   Yes [provider]  aspirin EC 81 MG tablet Take 81 mg by mouth every other day.    Yes [provider]  azelastine (ASTELIN) 0.1 % nasal spray Place 2 sprays into both nostrils 2 (two) times daily as needed (daily and as needed). Use in each nostril as directed    Yes [provider]  bisacodyl (DULCOLAX) 5 MG EC tablet Take 5 mg by mouth 2 (two) times daily as needed for moderate constipation.    Yes [provider]  buPROPion  (WELLBUTRIN XL) 150 MG 24 hr tablet Take 150 mg by mouth daily.   Yes [provider]  dicyclomine (BENTYL) 10 MG capsule Take 10 mg by mouth daily as needed for spasms (abdominal pain).   Yes [provider]  DULoxetine (CYMBALTA) 20 MG capsule Take 20 mg by mouth daily.   Yes [provider]  Emollient (GOLD BOND MEDICATED BODY) 5-0.5 % LOTN Apply 1 Dose topically daily.   Yes [provider]  fenofibrate micronized (LOFIBRA) 200 MG capsule Take 200 mg by mouth daily.   Yes [provider]  fluticasone-salmeterol (ADVAIR HFA) 230-21 MCG/ACT inhaler Inhale 2 puffs into the lungs 2 (two) times daily. Rinse mouth with water after use   Yes [provider]  hydrOXYzine (ATARAX/VISTARIL) 25 MG tablet Take 25 mg by mouth 3 (three) times daily as needed.   Yes [provider]  insulin aspart (NOVOLOG) 100 UNIT/ML FlexPen Inject 8 Units into the  skin 3 (three) times daily before meals. And As needed if BG >200. Inject as per sliding scale if 200-250=2; 251-300=4; 301-350=6; 351-400=8; 401+=10   Yes [provider]  Insulin Detemir (LEVEMIR FLEXTOUCH) 100 UNIT/ML Pen Inject 40 Units into the skin at bedtime.    Yes [provider]  levothyroxine (SYNTHROID, LEVOTHROID) 75 MCG tablet Take 75 mcg by mouth daily before breakfast.    Yes [provider]  linaclotide (LINZESS) 145 MCG CAPS capsule Take 1 capsule (145 mcg total) by mouth daily before breakfast. 09/05/18  Yes Tat, Shanon Brow, MD  liver oil-zinc oxide (DESITIN) 40 % ointment Apply 1 application topically every 3 (three) days. Every 12 hours and as needed   Yes [provider]  loperamide (IMODIUM A-D) 2 MG tablet Take 2 mg by mouth every 8 (eight) hours as needed for diarrhea or loose stools.   Yes [provider]  loratadine (CLARITIN) 5 MG chewable tablet Chew 5 mg by mouth daily as needed.    Yes [provider]  meloxicam (MOBIC) 7.5 MG  tablet Take 7.5 mg by mouth 2 (two) times a week. Every Monday and Friday for pain   Yes [provider]  metoprolol succinate (TOPROL-XL) 50 MG 24 hr tablet Take 1 tablet (50 mg total) by mouth daily. Take with or immediately following a meal. 09/05/18  Yes Tat, David, MD  nystatin cream (MYCOSTATIN) Apply 1 application topically 2 (two) times daily. Apply to Bilateral Groin for 4 weeks 04/28/20  Yes [provider]  Omega-3 Fatty Acids (FISH OIL) 1000 MG CAPS Take 1,000 mg by mouth 2 (two) times daily.    Yes [provider]  omeprazole (PRILOSEC) 20 MG capsule Take 20 mg by mouth daily.   Yes [provider]  Pitavastatin Calcium (LIVALO) 2 MG TABS Take 2 mg by mouth every evening.    Yes [provider]  polyethylene glycol (MIRALAX / GLYCOLAX) packet Take 17 g by mouth daily as needed.    Yes [provider]  polyvinyl alcohol (LIQUIFILM TEARS) 1.4 % ophthalmic solution Place 2 drops into both eyes in the morning and at bedtime.   Yes [provider]  potassium chloride (K-DUR) 10 MEQ tablet Take 10 mEq by mouth every Monday, Wednesday, and Friday.  09/07/18  Yes [provider]  pregabalin (LYRICA) 150 MG capsule Take 1 capsule (150 mg total) by mouth daily. 09/05/18  Yes Tat, Shanon Brow, MD  senna (SENOKOT) 8.6 MG TABS tablet Take 2 tablets by mouth daily.    Yes [provider]  torsemide (DEMADEX) 20 MG tablet Take 6 tablets (120 mg total) by mouth daily. Patient taking differently: Take 60 mg by mouth daily.  09/05/18  Yes Tat, Shanon Brow, MD  traMADol (ULTRAM) 50 MG tablet Take 50 mg by mouth 2 (two) times a week. Every Monday and Wednesday, Give 30 minutes before showers   Yes [provider]  traMADol (ULTRAM) 50 MG tablet Take 1 tablet (50 mg total) by mouth every 6 (six) hours as needed. 05/27/20  Yes Paulette Blanch, MD  Vitamin D, Ergocalciferol, (DRISDOL) 50000 units CAPS capsule Take 50,000 Units by mouth every  30 (thirty) days.    Yes [provider]  acetaminophen (TYLENOL) 325 MG tablet Take 650 mg by mouth 2 (two) times a week. Patient not taking: Reported on 05/22/2020    [provider]  BD AUTOSHIELD DUO 30G X 5 MM MISC USE WITH LEVEMIR AND NOVOLOG 12/25/18  Crecencio Mc, MD  fluconazole (DIFLUCAN) 150 MG tablet Take 150 mg by mouth every other day. Patient not taking: Reported on 05/26/2020    [provider]  Fluticasone-Salmeterol (ADVAIR DISKUS) 250-50 MCG/DOSE AEPB USE 1 PUFF VIA INHALATION 2 TIMES A DAY Patient not taking: Reported on 05/16/2020    [provider]  fluticasone-salmeterol (ADVAIR HFA) 230-21 MCG/ACT inhaler 2 puffs Patient not taking: Reported on 05/20/2020    [provider]  hydroxypropyl methylcellulose / hypromellose (ISOPTO TEARS / GONIOVISC) 2.5 % ophthalmic solution Place 2 drops into both eyes 2 (two) times daily. Patient not taking: Reported on 05/08/2020    [provider]  traMADol (ULTRAM) 50 MG tablet Take 50 mg by mouth every 6 (six) hours as needed for moderate pain (and severe pain). Patient not taking: Reported on 05/18/2020    [provider]    Physical Exam: Vitals:   05/21/2020 0729 05/30/2020 0730 05/10/2020 0731 05/18/2020 0732  BP:  (!) 139/54    Pulse:      Resp: 13 12 15 15   Temp:      SpO2:      Weight:      Height:         Vitals:   05/16/2020 0729 05/30/2020 0730 06/03/2020 0731 05/23/2020 0732  BP:  (!) 139/54    Pulse:      Resp: 13 12 15 15   Temp:      SpO2:      Weight:      Height:        Constitutional: NAD, alert and oriented x 2.  Laceration on the scalp Eyes: Significant periorbital swelling and ecchymosis ENMT: Mucous membranes are moist.  Neck: normal, supple, no masses, no thyromegaly Respiratory: clear to auscultation bilaterally, no wheezing, no crackles. Normal respiratory effort. No accessory muscle use.  Cardiovascular: Regular rate and rhythm, no murmurs / rubs  / gallops. No extremity edema. 2+ pedal pulses. No carotid bruits.  Abdomen: no tenderness, no masses palpated. No hepatosplenomegaly. Bowel sounds positive.  Musculoskeletal: no clubbing / cyanosis.  Right ankle is immobilized Skin: no rashes, lesions, ulcers.  Ecchymosis on forearms Neurologic: Generalized weakness Psychiatric: Normal mood and affect.   Labs on Admission: I have personally reviewed following labs and imaging studies  CBC: Recent Labs  Lab 05/15/2020 0320  WBC 5.3  NEUTROABS 3.1  HGB 12.7  HCT 36.9  MCV 91.6  PLT 562*   Basic Metabolic Panel: Recent Labs  Lab 05/24/2020 0320  NA 139  K 3.3*  CL 99  CO2 29  GLUCOSE 117*  BUN 32*  CREATININE 1.53*  CALCIUM 9.2   GFR: Estimated Creatinine Clearance: 23.2 mL/min (A) (by C-G formula based on SCr of 1.53 mg/dL (H)). Liver Function Tests: Recent Labs  Lab 05/07/2020 0320  AST 29  ALT 16  ALKPHOS 62  BILITOT 1.0  PROT 6.8  ALBUMIN 4.0   No results for input(s): LIPASE, AMYLASE in the last 168 hours. No results for input(s): AMMONIA in the last 168 hours. Coagulation Profile: Recent Labs  Lab 05/26/2020 0320  INR 1.2   Cardiac Enzymes: Recent Labs  Lab 05/06/2020 0320  CKTOTAL 109   BNP (last 3 results) No results for input(s): PROBNP in the last 8760 hours. HbA1C: No results for input(s): HGBA1C in the last 72 hours. CBG: Recent Labs  Lab 05/27/20 0100  GLUCAP 86   Lipid Profile: No results for input(s): CHOL, HDL, LDLCALC, TRIG, CHOLHDL, LDLDIRECT in the last  72 hours. Thyroid Function Tests: No results for input(s): TSH, T4TOTAL, FREET4, T3FREE, THYROIDAB in the last 72 hours. Anemia Panel: No results for input(s): VITAMINB12, FOLATE, FERRITIN, TIBC, IRON, RETICCTPCT in the last 72 hours. Urine analysis:    Component Value Date/Time   COLORURINE YELLOW (A) 02/02/2019 1302   APPEARANCEUR CLEAR (A) 02/02/2019 1302   LABSPEC 1.011 02/02/2019 1302   PHURINE 5.0 02/02/2019 1302    GLUCOSEU NEGATIVE 02/02/2019 1302   HGBUR LARGE (A) 02/02/2019 1302   BILIRUBINUR NEGATIVE 02/02/2019 1302   KETONESUR NEGATIVE 02/02/2019 1302   PROTEINUR 100 (A) 02/02/2019 1302   NITRITE NEGATIVE 02/02/2019 1302   LEUKOCYTESUR TRACE (A) 02/02/2019 1302    Radiological Exams on Admission: DG Wrist Complete Left  Result Date: 06/01/2020 CLINICAL DATA:  Left wrist pain after fall.  Also fall yesterday. EXAM: LEFT WRIST - COMPLETE 3+ VIEW COMPARISON:  None. FINDINGS: No evidence of acute fracture. There is slight dorsal tilt of the lunate. Moderate wrist joint osteoarthritis with joint space narrowing and subchondral cystic change. There is advanced osteoarthritis at the thumb carpal metacarpal joint. No focal soft tissue abnormality. IMPRESSION: 1. No acute fracture or subluxation of the left wrist. 2. Moderate wrist joint osteoarthritis. Advanced osteoarthritis at the thumb carpometacarpal joint. 3. Slight dorsal tilt of the lunate appears chronic. Electronically Signed   By: Keith Rake M.D.   On: 05/17/2020 03:29   DG Ankle Complete Right  Result Date: 05/30/2020 CLINICAL DATA:  Right ankle pain after fall.  Also fall yesterday. EXAM: RIGHT ANKLE - COMPLETE 3+ VIEW COMPARISON:  None. FINDINGS: Curvilinear osseous density adjacent to the dorsal talus is suspicious for acute avulsion injury. No other acute fracture. There is a moderate plantar calcaneal spur. Small ankle joint effusion. Generalized soft tissue edema. IMPRESSION: Findings suspicious for acute dorsal talar avulsion fracture. No other acute fracture of the right ankle. Electronically Signed   By: Keith Rake M.D.   On: 05/16/2020 03:32   CT Head Wo Contrast  Result Date: 05/26/2020 CLINICAL DATA:  Fall EXAM: CT HEAD WITHOUT CONTRAST CT MAXILLOFACIAL WITHOUT CONTRAST CT CERVICAL SPINE WITHOUT CONTRAST TECHNIQUE: Multidetector CT imaging of the head, cervical spine, and maxillofacial structures were performed using the  standard protocol without intravenous contrast. Multiplanar CT image reconstructions of the cervical spine and maxillofacial structures were also generated. COMPARISON:  None. FINDINGS: CT HEAD FINDINGS Brain: There is no mass, hemorrhage or extra-axial collection. The size and configuration of the ventricles and extra-axial CSF spaces are normal. The brain parenchyma is normal, without evidence of acute or chronic infarction. Vascular: No abnormal hyperdensity of the major intracranial arteries or dural venous sinuses. No intracranial atherosclerosis. Skull: Persistent large right frontal scalp hematoma. No skull fracture. CT MAXILLOFACIAL FINDINGS Osseous: --Complex facial fracture types: No LeFort, zygomaticomaxillary complex or nasoorbitoethmoidal fracture. --Simple fracture types: None. --Mandible: No fracture or dislocation. Orbits: Right periorbital laceration/soft tissue swelling. No intraorbital abnormality. Sinuses: No fluid levels or advanced mucosal thickening. Soft tissues: Bilateral facial subcutaneous edema has increased since 05/27/2020. CT CERVICAL SPINE FINDINGS Alignment: No static subluxation. Facets are aligned. Occipital condyles and the lateral masses of C1-C2 are aligned. Skull base and vertebrae: No acute fracture. Soft tissues and spinal canal: No prevertebral fluid or swelling. No visible canal hematoma. Disc levels: Multilevel facet arthrosis. Upper chest: No pneumothorax, pulmonary nodule or pleural effusion. Other: Normal visualized paraspinal cervical soft tissues. IMPRESSION: 1. No acute intracranial abnormality. 2. Persistent large right frontal scalp hematoma without skull fracture. 3. Right  periorbital laceration/soft tissue swelling without intraorbital abnormality. 4. Increased bilateral facial edema. 5. No facial fracture. 6. No acute fracture or static subluxation of the cervical spine. Electronically Signed   By: Ulyses Jarred M.D.   On: 05/21/2020 03:58   CT Head Wo  Contrast  Result Date: 05/27/2020 CLINICAL DATA:  Fall EXAM: CT HEAD WITHOUT CONTRAST CT MAXILLOFACIAL WITHOUT CONTRAST CT CERVICAL SPINE WITHOUT CONTRAST TECHNIQUE: Multidetector CT imaging of the head, cervical spine, and maxillofacial structures were performed using the standard protocol without intravenous contrast. Multiplanar CT image reconstructions of the cervical spine and maxillofacial structures were also generated. COMPARISON:  None. FINDINGS: CT HEAD FINDINGS Brain: There is no mass, hemorrhage or extra-axial collection. The size and configuration of the ventricles and extra-axial CSF spaces are normal. The brain parenchyma is normal, without evidence of acute or chronic infarction. Vascular: Atherosclerotic calcification of the internal carotid arteries at the skull base. No abnormal hyperdensity of the major intracranial arteries or dural venous sinuses. Skull: Large right frontal scalp hematoma.  No skull fracture. CT MAXILLOFACIAL FINDINGS Osseous: --Complex facial fracture types: No LeFort, zygomaticomaxillary complex or nasoorbitoethmoidal fracture. --Simple fracture types: None. --Mandible: No fracture or dislocation. Orbits: The globes are intact. Normal appearance of the intra- and extraconal fat. Symmetric extraocular muscles and optic nerves. Sinuses: No fluid levels or advanced mucosal thickening. Soft tissues: Normal visualized extracranial soft tissues. CT CERVICAL SPINE FINDINGS Alignment: Grade 1 anterolisthesis at C4-5 and C5-6 secondary to facet arthrosis. Facets are aligned. Occipital condyles and the lateral masses of C1-C2 are aligned. Skull base and vertebrae: No acute fracture. Soft tissues and spinal canal: No prevertebral fluid or swelling. No visible canal hematoma. Disc levels: Multilevel facet arthrosis. Upper chest: No pneumothorax, pulmonary nodule or pleural effusion. Other: Normal visualized paraspinal cervical soft tissues. IMPRESSION: 1. No acute intracranial  abnormality. 2. Large right frontal scalp hematoma without skull fracture or facial fracture. 3. No acute fracture or static subluxation of the cervical spine. Electronically Signed   By: Ulyses Jarred M.D.   On: 05/27/2020 01:45   CT Cervical Spine Wo Contrast  Result Date: 05/22/2020 CLINICAL DATA:  Fall EXAM: CT HEAD WITHOUT CONTRAST CT MAXILLOFACIAL WITHOUT CONTRAST CT CERVICAL SPINE WITHOUT CONTRAST TECHNIQUE: Multidetector CT imaging of the head, cervical spine, and maxillofacial structures were performed using the standard protocol without intravenous contrast. Multiplanar CT image reconstructions of the cervical spine and maxillofacial structures were also generated. COMPARISON:  None. FINDINGS: CT HEAD FINDINGS Brain: There is no mass, hemorrhage or extra-axial collection. The size and configuration of the ventricles and extra-axial CSF spaces are normal. The brain parenchyma is normal, without evidence of acute or chronic infarction. Vascular: No abnormal hyperdensity of the major intracranial arteries or dural venous sinuses. No intracranial atherosclerosis. Skull: Persistent large right frontal scalp hematoma. No skull fracture. CT MAXILLOFACIAL FINDINGS Osseous: --Complex facial fracture types: No LeFort, zygomaticomaxillary complex or nasoorbitoethmoidal fracture. --Simple fracture types: None. --Mandible: No fracture or dislocation. Orbits: Right periorbital laceration/soft tissue swelling. No intraorbital abnormality. Sinuses: No fluid levels or advanced mucosal thickening. Soft tissues: Bilateral facial subcutaneous edema has increased since 05/27/2020. CT CERVICAL SPINE FINDINGS Alignment: No static subluxation. Facets are aligned. Occipital condyles and the lateral masses of C1-C2 are aligned. Skull base and vertebrae: No acute fracture. Soft tissues and spinal canal: No prevertebral fluid or swelling. No visible canal hematoma. Disc levels: Multilevel facet arthrosis. Upper chest: No  pneumothorax, pulmonary nodule or pleural effusion. Other: Normal visualized paraspinal cervical soft tissues. IMPRESSION:  1. No acute intracranial abnormality. 2. Persistent large right frontal scalp hematoma without skull fracture. 3. Right periorbital laceration/soft tissue swelling without intraorbital abnormality. 4. Increased bilateral facial edema. 5. No facial fracture. 6. No acute fracture or static subluxation of the cervical spine. Electronically Signed   By: Ulyses Jarred M.D.   On: 05/27/2020 03:58   CT CERVICAL SPINE WO CONTRAST  Result Date: 05/27/2020 CLINICAL DATA:  Fall EXAM: CT HEAD WITHOUT CONTRAST CT MAXILLOFACIAL WITHOUT CONTRAST CT CERVICAL SPINE WITHOUT CONTRAST TECHNIQUE: Multidetector CT imaging of the head, cervical spine, and maxillofacial structures were performed using the standard protocol without intravenous contrast. Multiplanar CT image reconstructions of the cervical spine and maxillofacial structures were also generated. COMPARISON:  None. FINDINGS: CT HEAD FINDINGS Brain: There is no mass, hemorrhage or extra-axial collection. The size and configuration of the ventricles and extra-axial CSF spaces are normal. The brain parenchyma is normal, without evidence of acute or chronic infarction. Vascular: Atherosclerotic calcification of the internal carotid arteries at the skull base. No abnormal hyperdensity of the major intracranial arteries or dural venous sinuses. Skull: Large right frontal scalp hematoma.  No skull fracture. CT MAXILLOFACIAL FINDINGS Osseous: --Complex facial fracture types: No LeFort, zygomaticomaxillary complex or nasoorbitoethmoidal fracture. --Simple fracture types: None. --Mandible: No fracture or dislocation. Orbits: The globes are intact. Normal appearance of the intra- and extraconal fat. Symmetric extraocular muscles and optic nerves. Sinuses: No fluid levels or advanced mucosal thickening. Soft tissues: Normal visualized extracranial soft tissues. CT  CERVICAL SPINE FINDINGS Alignment: Grade 1 anterolisthesis at C4-5 and C5-6 secondary to facet arthrosis. Facets are aligned. Occipital condyles and the lateral masses of C1-C2 are aligned. Skull base and vertebrae: No acute fracture. Soft tissues and spinal canal: No prevertebral fluid or swelling. No visible canal hematoma. Disc levels: Multilevel facet arthrosis. Upper chest: No pneumothorax, pulmonary nodule or pleural effusion. Other: Normal visualized paraspinal cervical soft tissues. IMPRESSION: 1. No acute intracranial abnormality. 2. Large right frontal scalp hematoma without skull fracture or facial fracture. 3. No acute fracture or static subluxation of the cervical spine. Electronically Signed   By: Ulyses Jarred M.D.   On: 05/27/2020 01:45   CT Maxillofacial Wo Contrast  Result Date: 05/13/2020 CLINICAL DATA:  Fall EXAM: CT HEAD WITHOUT CONTRAST CT MAXILLOFACIAL WITHOUT CONTRAST CT CERVICAL SPINE WITHOUT CONTRAST TECHNIQUE: Multidetector CT imaging of the head, cervical spine, and maxillofacial structures were performed using the standard protocol without intravenous contrast. Multiplanar CT image reconstructions of the cervical spine and maxillofacial structures were also generated. COMPARISON:  None. FINDINGS: CT HEAD FINDINGS Brain: There is no mass, hemorrhage or extra-axial collection. The size and configuration of the ventricles and extra-axial CSF spaces are normal. The brain parenchyma is normal, without evidence of acute or chronic infarction. Vascular: No abnormal hyperdensity of the major intracranial arteries or dural venous sinuses. No intracranial atherosclerosis. Skull: Persistent large right frontal scalp hematoma. No skull fracture. CT MAXILLOFACIAL FINDINGS Osseous: --Complex facial fracture types: No LeFort, zygomaticomaxillary complex or nasoorbitoethmoidal fracture. --Simple fracture types: None. --Mandible: No fracture or dislocation. Orbits: Right periorbital laceration/soft  tissue swelling. No intraorbital abnormality. Sinuses: No fluid levels or advanced mucosal thickening. Soft tissues: Bilateral facial subcutaneous edema has increased since 05/27/2020. CT CERVICAL SPINE FINDINGS Alignment: No static subluxation. Facets are aligned. Occipital condyles and the lateral masses of C1-C2 are aligned. Skull base and vertebrae: No acute fracture. Soft tissues and spinal canal: No prevertebral fluid or swelling. No visible canal hematoma. Disc levels: Multilevel facet arthrosis. Upper  chest: No pneumothorax, pulmonary nodule or pleural effusion. Other: Normal visualized paraspinal cervical soft tissues. IMPRESSION: 1. No acute intracranial abnormality. 2. Persistent large right frontal scalp hematoma without skull fracture. 3. Right periorbital laceration/soft tissue swelling without intraorbital abnormality. 4. Increased bilateral facial edema. 5. No facial fracture. 6. No acute fracture or static subluxation of the cervical spine. Electronically Signed   By: Ulyses Jarred M.D.   On: 05/17/2020 03:58   CT MAXILLOFACIAL WO CONTRAST  Result Date: 05/27/2020 CLINICAL DATA:  Fall EXAM: CT HEAD WITHOUT CONTRAST CT MAXILLOFACIAL WITHOUT CONTRAST CT CERVICAL SPINE WITHOUT CONTRAST TECHNIQUE: Multidetector CT imaging of the head, cervical spine, and maxillofacial structures were performed using the standard protocol without intravenous contrast. Multiplanar CT image reconstructions of the cervical spine and maxillofacial structures were also generated. COMPARISON:  None. FINDINGS: CT HEAD FINDINGS Brain: There is no mass, hemorrhage or extra-axial collection. The size and configuration of the ventricles and extra-axial CSF spaces are normal. The brain parenchyma is normal, without evidence of acute or chronic infarction. Vascular: Atherosclerotic calcification of the internal carotid arteries at the skull base. No abnormal hyperdensity of the major intracranial arteries or dural venous sinuses.  Skull: Large right frontal scalp hematoma.  No skull fracture. CT MAXILLOFACIAL FINDINGS Osseous: --Complex facial fracture types: No LeFort, zygomaticomaxillary complex or nasoorbitoethmoidal fracture. --Simple fracture types: None. --Mandible: No fracture or dislocation. Orbits: The globes are intact. Normal appearance of the intra- and extraconal fat. Symmetric extraocular muscles and optic nerves. Sinuses: No fluid levels or advanced mucosal thickening. Soft tissues: Normal visualized extracranial soft tissues. CT CERVICAL SPINE FINDINGS Alignment: Grade 1 anterolisthesis at C4-5 and C5-6 secondary to facet arthrosis. Facets are aligned. Occipital condyles and the lateral masses of C1-C2 are aligned. Skull base and vertebrae: No acute fracture. Soft tissues and spinal canal: No prevertebral fluid or swelling. No visible canal hematoma. Disc levels: Multilevel facet arthrosis. Upper chest: No pneumothorax, pulmonary nodule or pleural effusion. Other: Normal visualized paraspinal cervical soft tissues. IMPRESSION: 1. No acute intracranial abnormality. 2. Large right frontal scalp hematoma without skull fracture or facial fracture. 3. No acute fracture or static subluxation of the cervical spine. Electronically Signed   By: Ulyses Jarred M.D.   On: 05/27/2020 01:45   CT Temporal Bones Wo Contrast  Result Date: 05/07/2020 CLINICAL DATA:  Fall. Hit head on table. Right frontal hematoma. Possible CSF leak. EXAM: CT TEMPORAL BONES WITHOUT CONTRAST TECHNIQUE: Axial and coronal plane CT imaging of the petrous temporal bones was performed with thin-collimation image reconstruction. No intravenous contrast was administered. Multiplanar CT image reconstructions were also generated. COMPARISON:  CT of the head, face, and cervical spine 05/27/2020 and 05/27/2020. FINDINGS: The visualized intracranial contents are within normal limits. Visualized paranasal sinuses are clear. Known right frontal scalp hematoma is not  imaged. Asymmetric stranding is present over the visualized right face extending to the periauricular area on the right without underlying fracture. Vascular calcifications are present without hyperdense vessels The right external auditory canal is within normal limits. The tympanic membrane is visualized and appears to be intact. The middle ear ossicles are normally formed and articulating. Middle ear cavity is clear. Epitympanum unremarkable. The mastoid air cells are clear. Oval window is patent. The inner ear structures are normally formed. The superior semicircular canal is covered. The internal auditory canal and vestibular aqueduct are within normal limits. The left external auditory canal is within normal limits. The tympanic membrane is visualized and appears to be intact. The middle ear  ossicles are normally formed and articulating. Middle ear cavity is clear. Epitympanum unremarkable. The mastoid air cells are clear. Oval window is patent. The inner ear structures are normally formed. The superior semicircular canal is covered. The internal auditory canal and vestibular aqueduct are within normal limits. IMPRESSION: 1. Asymmetric stranding over the visualized right face extending to the periauricular area without underlying fracture. 2. Normal CT appearance of the temporal bones bilaterally. 3. Known right frontal scalp hematoma is not imaged. Electronically Signed   By: San Morelle M.D.   On: 05/21/2020 06:13    EKG: Independently reviewed.  Normal sinus rhythm  Assessment/Plan Principal Problem:   Recurrent falls Active Problems:   Hypothyroidism   Chronic ITP (idiopathic thrombocytopenia) (HCC)   Essential hypertension   Closed right ankle fracture      Recurrent falls Patient was seen in the emergency room twice in the last 24 hours for evaluation of mechanical falls She currently has a scalp laceration, significant periorbital swelling and ecchymosis as well as a right  ankle fracture Place patient on fall precautions She will need physical therapy evaluation once she is stable   Hypothyroidism Continue Synthroid   Chronic idiopathic thrombocytopenia Patient's platelet count is 122 She has had a 1g drop in her H&H in the last 24 hours We will monitor H&H closely    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  She verbalizes understanding and agrees with the plan. Disposition Plan: Back to previous home environment Consults called: Orthopedics, ophthalmology    Collier Bullock MD [facility]     05/18/2020, 9:35 AM

## 2020-05-28 NOTE — ED Notes (Addendum)
Pts head and neck cleaned of dried blood with normal saline and bath wipes. Pts laceration to the head, cleaned with normal saline and wet gauze placed on top. Pt is very pleasant, making jokes and is very appreciative of the help and service  Pts oral cavity also cleaned with water and cotton q-tips.   Pt states pain to the back. ER provider notified

## 2020-05-28 NOTE — ED Notes (Signed)
ED tech placed splint to the right lower extremity.

## 2020-05-28 NOTE — ED Notes (Addendum)
Patient is speaking with her daughter [name] on the phone who is her POA. Patient was placed on 2L O2 for sats of 89-90%. Patient's face and eyes were wiped with a warm cloth. Patient refused additional cleaning.

## 2020-05-28 NOTE — ED Notes (Addendum)
RN attempted to call Daughter Sabrina Small. No answer.  Pt states she fell yesterday and then fell tonight. Pt noted to have dried blood to the oral cavity, bleeding to the lower lip, bruising noted to both eyes and abdomen. Pt has wraps on the left lower leg and left wrist.  Pt noted to have swollen eyes shut, and bruising dark purple in color. Pts abdomen noted to have green bruises.   Pt states pain to right ankle, and head. Pt noted to have an approximately 3.5inch laceration to the right forehead. Pt does not believe she had LOC.   Pt on cardiac, bp and pulse ox monitoring

## 2020-05-28 NOTE — ED Notes (Addendum)
Report given to Sherie RN 

## 2020-05-28 NOTE — ED Notes (Signed)
Patient was moved from the ED stretcher to a hospital bed. Patient tolerated procedure well. Purewick remains in place.

## 2020-05-28 NOTE — ED Notes (Signed)
Patient refused to eat dinner, states she is not hungry. Patient was encouraged to eat and meal was cut up, but patient still declined meal.

## 2020-05-28 NOTE — Consult Note (Signed)
ORTHOPAEDIC CONSULTATION  REQUESTING PHYSICIAN: Collier Bullock, MD  Chief Complaint:   Right ankle pain.  History of Present Illness: Sabrina Small is a 86 y.o. female with a history of diabetes, gout, hypothyroidism, sleep apnea, depression, and congestive heart failure who lives independently, but uses a walker for ambulation.  The patient fell 2 days ago and wonders if she may have injured her ankle at that time.  She was seen in emergency room for a moderate amount of swelling to her right forehead region, but discharged after thorough evaluation.  The patient apparently developed increased forehead swelling over the ensuing 12 hours to where her right eye closed.  She had difficulty with her vision and lost her balance when she tried to reach for her walker while getting up.  She fell and struck her head again, this time opening up a laceration.  She was brought to the emergency room complaining of multiple issues, including right ankle pain.  Again, she denies any loss of consciousness, nor did she note any lightheadedness, dizziness, chest pain, or other symptoms which may have precipitated her fall.  Because of her repeated falls and resultant injuries, the patient is being admitted at this time for further evaluation and observation.  Work-up in the emergency room included x-rays of her right ankle which demonstrated an acute avulsion fracture off the dorsum of her talar head without any other associated bony injury.  The patient notes that she does have a history of peripheral neuropathy, but otherwise denies any new numbness to her foot.  Past Medical History:  Diagnosis Date  . Allergy   . Arthritis   . Bell's palsy   . CHF (congestive heart failure) ([place])   . Colon polyps   . Depression   . Diabetes mellitus without complication ([place])   . Diabetes mellitus, type II ([place])   . Diverticulitis   . Gout   .  Hypothyroidism   . Sleep apnea    Past Surgical History:  Procedure Laterality Date  . ABDOMINAL HYSTERECTOMY    . CHOLECYSTECTOMY    . COLONOSCOPY WITH PROPOFOL N/A 02/04/2017   Procedure: COLONOSCOPY WITH PROPOFOL;  Surgeon: Lollie Sails, MD;  Location: [facility] ENDOSCOPY;  Service: Endoscopy;  Laterality: N/A;  . HERNIA REPAIR     Social History   Socioeconomic History  . Marital status: Widowed    Spouse name: Not on file  . Number of children: 5  . Years of education: Not on file  . Highest education level: Some college, no degree  Occupational History  . Not on file  Tobacco Use  . Smoking status: Former Smoker    Packs/day: 2.00    Years: 30.00    Pack years: 60.00    Quit date: 03/08/1985    Years since quitting: 35.2  . Smokeless tobacco: Never Used  Vaping Use  . Vaping Use: Never used  Substance and Sexual Activity  . Alcohol use: No  . Drug use: No  . Sexual activity: Not Currently  Other Topics Concern  . Not on file  Social History Narrative  . Not on file   Social Determinants of Health   Financial Resource Strain:   . Difficulty of Paying Living Expenses:   Food Insecurity:   . Worried About Charity fundraiser in the Last Year:   . Arboriculturist in the Last Year:   Transportation Needs:   . Film/video editor (Medical):   Marland Kitchen Lack of Transportation (  Non-Medical):   Physical Activity:   . Days of Exercise per Week:   . Minutes of Exercise per Session:   Stress:   . Feeling of Stress :   Social Connections:   . Frequency of Communication with Friends and Family:   . Frequency of Social Gatherings with Friends and Family:   . Attends Religious Services:   . Active Member of Clubs or Organizations:   . Attends Archivist Meetings:   Marland Kitchen Marital Status:    Family History  Adopted: Yes  Problem Relation Age of Onset  . Mental illness Son    Allergies  Allergen Reactions  . Ciprocin-Fluocin-Procin  [Fluocinolone] Other (See  Comments)  . Doxycycline     Patient cannot recall details of reaction  . Dust Mite Mixed Allergen Ext [Mite (D. Farinae)]   . Glipizide   . Iodinated Diagnostic Agents   . Iodine Other (See Comments)  . Liraglutide Other (See Comments)  . Lortab [Hydrocodone-Acetaminophen]   . Morphine And Related   . Omnicef [Cefdinir] Other (See Comments)    Patient cannot recall details of reaction  . Other     Cat dander  . Penicillins Other (See Comments)    Has patient had a PCN reaction causing immediate rash, facial/tongue/throat swelling, SOB or lightheadedness with hypotension: Unknown Has patient had a PCN reaction causing severe rash involving mucus membranes or skin necrosis: Unknown Has patient had a PCN reaction that required hospitalization: Unknown Has patient had a PCN reaction occurring within the last 10 years: No If all of the above answers are "NO", then may proceed with Cephalosporin use. "I blow up and pass out" Over 10 yrs ago.  Marland Kitchen Percocet [Oxycodone-Acetaminophen] Other (See Comments)  . Rosuvastatin   . Sulfa Antibiotics Other (See Comments)    Patient cannot recall details of reaction  . Sulfamethoxazole-Trimethoprim     Other reaction(s): Unknown Other reaction(s): Unknown   . Sulfasalazine     Other reaction(s): Other (See Comments) Patient cannot recall details of reaction  . Trimethoprim   . Trovafloxacin     Patient cannot recall details of reaction  . Vancomycin Other (See Comments)  . Voltaren [Diclofenac Sodium] Other (See Comments)  . Zolpidem    Prior to Admission medications   Medication Sig Start Date End Date Taking? Authorizing Provider  Acetaminophen (TYLENOL EXTRA STRENGTH PO) Take 2 tablets by mouth 2 (two) times a week. In the morning every Monday and Wednesday for pain   Yes [provider]  albuterol (VENTOLIN HFA) 108 (90 Base) MCG/ACT inhaler Inhale 2 puffs into the lungs every 6 (six) hours as needed for shortness of breath.     Yes [provider]  Albuterol Sulfate 2.5 MG/0.5ML NEBU Inhale 1 each into the lungs in the morning and at bedtime.   Yes [provider]  allopurinol (ZYLOPRIM) 100 MG tablet Take 100 mg by mouth daily.   Yes [provider]  amLODipine (NORVASC) 10 MG tablet Take 10 mg by mouth daily.   Yes [provider]  aspirin EC 81 MG tablet Take 81 mg by mouth every other day.    Yes [provider]  azelastine (ASTELIN) 0.1 % nasal spray Place 2 sprays into both nostrils 2 (two) times daily as needed (daily and as needed). Use in each nostril as directed    Yes [provider]  bisacodyl (DULCOLAX) 5 MG EC tablet Take 5 mg by mouth 2 (two) times daily as needed  for moderate constipation.    Yes [provider]  buPROPion (WELLBUTRIN XL) 150 MG 24 hr tablet Take 150 mg by mouth daily.   Yes [provider]  dicyclomine (BENTYL) 10 MG capsule Take 10 mg by mouth daily as needed for spasms (abdominal pain).   Yes [provider]  DULoxetine (CYMBALTA) 20 MG capsule Take 20 mg by mouth daily.   Yes [provider]  Emollient (GOLD BOND MEDICATED BODY) 5-0.5 % LOTN Apply 1 Dose topically daily.   Yes [provider]  fenofibrate micronized (LOFIBRA) 200 MG capsule Take 200 mg by mouth daily.   Yes [provider]  fluticasone-salmeterol (ADVAIR HFA) 230-21 MCG/ACT inhaler Inhale 2 puffs into the lungs 2 (two) times daily. Rinse mouth with water after use   Yes [provider]  hydrOXYzine (ATARAX/VISTARIL) 25 MG tablet Take 25 mg by mouth 3 (three) times daily as needed.   Yes [provider]  insulin aspart (NOVOLOG) 100 UNIT/ML FlexPen Inject 8 Units into the skin 3 (three) times daily before meals. And As needed if BG >200. Inject as per sliding scale if 200-250=2; 251-300=4; 301-350=6; 351-400=8; 401+=10   Yes [provider]  Insulin Detemir (LEVEMIR FLEXTOUCH) 100 UNIT/ML  Pen Inject 40 Units into the skin at bedtime.    Yes [provider]  levothyroxine (SYNTHROID, LEVOTHROID) 75 MCG tablet Take 75 mcg by mouth daily before breakfast.    Yes [provider]  linaclotide (LINZESS) 145 MCG CAPS capsule Take 1 capsule (145 mcg total) by mouth daily before breakfast. 09/05/18  Yes Tat, Shanon Brow, MD  liver oil-zinc oxide (DESITIN) 40 % ointment Apply 1 application topically every 3 (three) days. Every 12 hours and as needed   Yes [provider]  loperamide (IMODIUM A-D) 2 MG tablet Take 2 mg by mouth every 8 (eight) hours as needed for diarrhea or loose stools.   Yes [provider]  loratadine (CLARITIN) 5 MG chewable tablet Chew 5 mg by mouth daily as needed.    Yes [provider]  meloxicam (MOBIC) 7.5 MG tablet Take 7.5 mg by mouth 2 (two) times a week. Every Monday and Friday for pain   Yes [provider]  metoprolol succinate (TOPROL-XL) 50 MG 24 hr tablet Take 1 tablet (50 mg total) by mouth daily. Take with or immediately following a meal. 09/05/18  Yes Tat, David, MD  nystatin cream (MYCOSTATIN) Apply 1 application topically 2 (two) times daily. Apply to Bilateral Groin for 4 weeks 04/28/20  Yes [provider]  Omega-3 Fatty Acids (FISH OIL) 1000 MG CAPS Take 1,000 mg by mouth 2 (two) times daily.    Yes [provider]  omeprazole (PRILOSEC) 20 MG capsule Take 20 mg by mouth daily.   Yes [provider]  Pitavastatin Calcium (LIVALO) 2 MG TABS Take 2 mg by mouth every evening.    Yes [provider]  polyethylene glycol (MIRALAX / GLYCOLAX) packet Take 17 g by mouth daily as needed.    Yes [provider]  polyvinyl alcohol (LIQUIFILM TEARS) 1.4 % ophthalmic solution Place 2 drops into both eyes in the morning and at bedtime.   Yes [provider]  potassium chloride (K-DUR) 10 MEQ tablet Take 10 mEq by mouth every Monday, Wednesday, and Friday.  09/07/18  Yes  [provider]  pregabalin (LYRICA) 150 MG capsule Take 1 capsule (150 mg total) by mouth daily. 09/05/18  Yes Orson Eva, MD  senna (  SENOKOT) 8.6 MG TABS tablet Take 2 tablets by mouth daily.    Yes [provider]  torsemide (DEMADEX) 20 MG tablet Take 6 tablets (120 mg total) by mouth daily. Patient taking differently: Take 60 mg by mouth daily.  09/05/18  Yes Tat, Shanon Brow, MD  traMADol (ULTRAM) 50 MG tablet Take 50 mg by mouth 2 (two) times a week. Every Monday and Wednesday, Give 30 minutes before showers   Yes [provider]  traMADol (ULTRAM) 50 MG tablet Take 1 tablet (50 mg total) by mouth every 6 (six) hours as needed. 05/27/20  Yes Paulette Blanch, MD  Vitamin D, Ergocalciferol, (DRISDOL) 50000 units CAPS capsule Take 50,000 Units by mouth every 30 (thirty) days.    Yes [provider]  acetaminophen (TYLENOL) 325 MG tablet Take 650 mg by mouth 2 (two) times a week. Patient not taking: Reported on 05/25/2020    [provider]  BD AUTOSHIELD DUO 30G X 5 MM MISC USE WITH LEVEMIR AND NOVOLOG 12/25/18   Crecencio Mc, MD  fluconazole (DIFLUCAN) 150 MG tablet Take 150 mg by mouth every other day. Patient not taking: Reported on 05/26/2020    [provider]  Fluticasone-Salmeterol (ADVAIR DISKUS) 250-50 MCG/DOSE AEPB USE 1 PUFF VIA INHALATION 2 TIMES A DAY Patient not taking: Reported on 05/19/2020    [provider]  fluticasone-salmeterol (ADVAIR HFA) 230-21 MCG/ACT inhaler 2 puffs Patient not taking: Reported on 05/29/2020    [provider]  hydroxypropyl methylcellulose / hypromellose (ISOPTO TEARS / GONIOVISC) 2.5 % ophthalmic solution Place 2 drops into both eyes 2 (two) times daily. Patient not taking: Reported on 05/14/2020    [provider]  traMADol (ULTRAM) 50 MG tablet Take 50 mg by mouth every 6 (six) hours as needed for moderate pain (and severe pain). Patient not taking: Reported on 05/21/2020     [provider]   DG Wrist Complete Left  Result Date: 06/01/2020 CLINICAL DATA:  Left wrist pain after fall.  Also fall yesterday. EXAM: LEFT WRIST - COMPLETE 3+ VIEW COMPARISON:  None. FINDINGS: No evidence of acute fracture. There is slight dorsal tilt of the lunate. Moderate wrist joint osteoarthritis with joint space narrowing and subchondral cystic change. There is advanced osteoarthritis at the thumb carpal metacarpal joint. No focal soft tissue abnormality. IMPRESSION: 1. No acute fracture or subluxation of the left wrist. 2. Moderate wrist joint osteoarthritis. Advanced osteoarthritis at the thumb carpometacarpal joint. 3. Slight dorsal tilt of the lunate appears chronic. Electronically Signed   By: Keith Rake M.D.   On: 05/19/2020 03:29   DG Ankle Complete Right  Result Date: 05/12/2020 CLINICAL DATA:  Right ankle pain after fall.  Also fall yesterday. EXAM: RIGHT ANKLE - COMPLETE 3+ VIEW COMPARISON:  None. FINDINGS: Curvilinear osseous density adjacent to the dorsal talus is suspicious for acute avulsion injury. No other acute fracture. There is a moderate plantar calcaneal spur. Small ankle joint effusion. Generalized soft tissue edema. IMPRESSION: Findings suspicious for acute dorsal talar avulsion fracture. No other acute fracture of the right ankle. Electronically Signed   By: Keith Rake M.D.   On: 05/22/2020 03:32   CT Head Wo Contrast  Result Date: 05/19/2020 CLINICAL DATA:  Fall EXAM: CT HEAD WITHOUT CONTRAST CT MAXILLOFACIAL WITHOUT CONTRAST CT CERVICAL SPINE WITHOUT CONTRAST TECHNIQUE: Multidetector CT imaging of the head, cervical spine, and maxillofacial structures were performed using the standard protocol without intravenous contrast. Multiplanar CT image reconstructions of the cervical spine  and maxillofacial structures were also generated. COMPARISON:  None. FINDINGS: CT HEAD FINDINGS Brain: There is no mass, hemorrhage or extra-axial collection. The size  and configuration of the ventricles and extra-axial CSF spaces are normal. The brain parenchyma is normal, without evidence of acute or chronic infarction. Vascular: No abnormal hyperdensity of the major intracranial arteries or dural venous sinuses. No intracranial atherosclerosis. Skull: Persistent large right frontal scalp hematoma. No skull fracture. CT MAXILLOFACIAL FINDINGS Osseous: --Complex facial fracture types: No LeFort, zygomaticomaxillary complex or nasoorbitoethmoidal fracture. --Simple fracture types: None. --Mandible: No fracture or dislocation. Orbits: Right periorbital laceration/soft tissue swelling. No intraorbital abnormality. Sinuses: No fluid levels or advanced mucosal thickening. Soft tissues: Bilateral facial subcutaneous edema has increased since 05/27/2020. CT CERVICAL SPINE FINDINGS Alignment: No static subluxation. Facets are aligned. Occipital condyles and the lateral masses of C1-C2 are aligned. Skull base and vertebrae: No acute fracture. Soft tissues and spinal canal: No prevertebral fluid or swelling. No visible canal hematoma. Disc levels: Multilevel facet arthrosis. Upper chest: No pneumothorax, pulmonary nodule or pleural effusion. Other: Normal visualized paraspinal cervical soft tissues. IMPRESSION: 1. No acute intracranial abnormality. 2. Persistent large right frontal scalp hematoma without skull fracture. 3. Right periorbital laceration/soft tissue swelling without intraorbital abnormality. 4. Increased bilateral facial edema. 5. No facial fracture. 6. No acute fracture or static subluxation of the cervical spine. Electronically Signed   By: Ulyses Jarred M.D.   On: 05/16/2020 03:58   CT Head Wo Contrast  Result Date: 05/27/2020 CLINICAL DATA:  Fall EXAM: CT HEAD WITHOUT CONTRAST CT MAXILLOFACIAL WITHOUT CONTRAST CT CERVICAL SPINE WITHOUT CONTRAST TECHNIQUE: Multidetector CT imaging of the head, cervical spine, and maxillofacial structures were performed using the  standard protocol without intravenous contrast. Multiplanar CT image reconstructions of the cervical spine and maxillofacial structures were also generated. COMPARISON:  None. FINDINGS: CT HEAD FINDINGS Brain: There is no mass, hemorrhage or extra-axial collection. The size and configuration of the ventricles and extra-axial CSF spaces are normal. The brain parenchyma is normal, without evidence of acute or chronic infarction. Vascular: Atherosclerotic calcification of the internal carotid arteries at the skull base. No abnormal hyperdensity of the major intracranial arteries or dural venous sinuses. Skull: Large right frontal scalp hematoma.  No skull fracture. CT MAXILLOFACIAL FINDINGS Osseous: --Complex facial fracture types: No LeFort, zygomaticomaxillary complex or nasoorbitoethmoidal fracture. --Simple fracture types: None. --Mandible: No fracture or dislocation. Orbits: The globes are intact. Normal appearance of the intra- and extraconal fat. Symmetric extraocular muscles and optic nerves. Sinuses: No fluid levels or advanced mucosal thickening. Soft tissues: Normal visualized extracranial soft tissues. CT CERVICAL SPINE FINDINGS Alignment: Grade 1 anterolisthesis at C4-5 and C5-6 secondary to facet arthrosis. Facets are aligned. Occipital condyles and the lateral masses of C1-C2 are aligned. Skull base and vertebrae: No acute fracture. Soft tissues and spinal canal: No prevertebral fluid or swelling. No visible canal hematoma. Disc levels: Multilevel facet arthrosis. Upper chest: No pneumothorax, pulmonary nodule or pleural effusion. Other: Normal visualized paraspinal cervical soft tissues. IMPRESSION: 1. No acute intracranial abnormality. 2. Large right frontal scalp hematoma without skull fracture or facial fracture. 3. No acute fracture or static subluxation of the cervical spine. Electronically Signed   By: Ulyses Jarred M.D.   On: 05/27/2020 01:45   CT Cervical Spine Wo Contrast  Result Date:  05/17/2020 CLINICAL DATA:  Fall EXAM: CT HEAD WITHOUT CONTRAST CT MAXILLOFACIAL WITHOUT CONTRAST CT CERVICAL SPINE WITHOUT CONTRAST TECHNIQUE: Multidetector CT imaging of the head, cervical spine, and maxillofacial structures  were performed using the standard protocol without intravenous contrast. Multiplanar CT image reconstructions of the cervical spine and maxillofacial structures were also generated. COMPARISON:  None. FINDINGS: CT HEAD FINDINGS Brain: There is no mass, hemorrhage or extra-axial collection. The size and configuration of the ventricles and extra-axial CSF spaces are normal. The brain parenchyma is normal, without evidence of acute or chronic infarction. Vascular: No abnormal hyperdensity of the major intracranial arteries or dural venous sinuses. No intracranial atherosclerosis. Skull: Persistent large right frontal scalp hematoma. No skull fracture. CT MAXILLOFACIAL FINDINGS Osseous: --Complex facial fracture types: No LeFort, zygomaticomaxillary complex or nasoorbitoethmoidal fracture. --Simple fracture types: None. --Mandible: No fracture or dislocation. Orbits: Right periorbital laceration/soft tissue swelling. No intraorbital abnormality. Sinuses: No fluid levels or advanced mucosal thickening. Soft tissues: Bilateral facial subcutaneous edema has increased since 05/27/2020. CT CERVICAL SPINE FINDINGS Alignment: No static subluxation. Facets are aligned. Occipital condyles and the lateral masses of C1-C2 are aligned. Skull base and vertebrae: No acute fracture. Soft tissues and spinal canal: No prevertebral fluid or swelling. No visible canal hematoma. Disc levels: Multilevel facet arthrosis. Upper chest: No pneumothorax, pulmonary nodule or pleural effusion. Other: Normal visualized paraspinal cervical soft tissues. IMPRESSION: 1. No acute intracranial abnormality. 2. Persistent large right frontal scalp hematoma without skull fracture. 3. Right periorbital laceration/soft tissue swelling  without intraorbital abnormality. 4. Increased bilateral facial edema. 5. No facial fracture. 6. No acute fracture or static subluxation of the cervical spine. Electronically Signed   By: Ulyses Jarred M.D.   On: 05/19/2020 03:58   CT CERVICAL SPINE WO CONTRAST  Result Date: 05/27/2020 CLINICAL DATA:  Fall EXAM: CT HEAD WITHOUT CONTRAST CT MAXILLOFACIAL WITHOUT CONTRAST CT CERVICAL SPINE WITHOUT CONTRAST TECHNIQUE: Multidetector CT imaging of the head, cervical spine, and maxillofacial structures were performed using the standard protocol without intravenous contrast. Multiplanar CT image reconstructions of the cervical spine and maxillofacial structures were also generated. COMPARISON:  None. FINDINGS: CT HEAD FINDINGS Brain: There is no mass, hemorrhage or extra-axial collection. The size and configuration of the ventricles and extra-axial CSF spaces are normal. The brain parenchyma is normal, without evidence of acute or chronic infarction. Vascular: Atherosclerotic calcification of the internal carotid arteries at the skull base. No abnormal hyperdensity of the major intracranial arteries or dural venous sinuses. Skull: Large right frontal scalp hematoma.  No skull fracture. CT MAXILLOFACIAL FINDINGS Osseous: --Complex facial fracture types: No LeFort, zygomaticomaxillary complex or nasoorbitoethmoidal fracture. --Simple fracture types: None. --Mandible: No fracture or dislocation. Orbits: The globes are intact. Normal appearance of the intra- and extraconal fat. Symmetric extraocular muscles and optic nerves. Sinuses: No fluid levels or advanced mucosal thickening. Soft tissues: Normal visualized extracranial soft tissues. CT CERVICAL SPINE FINDINGS Alignment: Grade 1 anterolisthesis at C4-5 and C5-6 secondary to facet arthrosis. Facets are aligned. Occipital condyles and the lateral masses of C1-C2 are aligned. Skull base and vertebrae: No acute fracture. Soft tissues and spinal canal: No prevertebral  fluid or swelling. No visible canal hematoma. Disc levels: Multilevel facet arthrosis. Upper chest: No pneumothorax, pulmonary nodule or pleural effusion. Other: Normal visualized paraspinal cervical soft tissues. IMPRESSION: 1. No acute intracranial abnormality. 2. Large right frontal scalp hematoma without skull fracture or facial fracture. 3. No acute fracture or static subluxation of the cervical spine. Electronically Signed   By: Ulyses Jarred M.D.   On: 05/27/2020 01:45   CT Maxillofacial Wo Contrast  Result Date: 05/25/2020 CLINICAL DATA:  Fall EXAM: CT HEAD WITHOUT CONTRAST CT MAXILLOFACIAL WITHOUT CONTRAST CT  CERVICAL SPINE WITHOUT CONTRAST TECHNIQUE: Multidetector CT imaging of the head, cervical spine, and maxillofacial structures were performed using the standard protocol without intravenous contrast. Multiplanar CT image reconstructions of the cervical spine and maxillofacial structures were also generated. COMPARISON:  None. FINDINGS: CT HEAD FINDINGS Brain: There is no mass, hemorrhage or extra-axial collection. The size and configuration of the ventricles and extra-axial CSF spaces are normal. The brain parenchyma is normal, without evidence of acute or chronic infarction. Vascular: No abnormal hyperdensity of the major intracranial arteries or dural venous sinuses. No intracranial atherosclerosis. Skull: Persistent large right frontal scalp hematoma. No skull fracture. CT MAXILLOFACIAL FINDINGS Osseous: --Complex facial fracture types: No LeFort, zygomaticomaxillary complex or nasoorbitoethmoidal fracture. --Simple fracture types: None. --Mandible: No fracture or dislocation. Orbits: Right periorbital laceration/soft tissue swelling. No intraorbital abnormality. Sinuses: No fluid levels or advanced mucosal thickening. Soft tissues: Bilateral facial subcutaneous edema has increased since 05/27/2020. CT CERVICAL SPINE FINDINGS Alignment: No static subluxation. Facets are aligned. Occipital  condyles and the lateral masses of C1-C2 are aligned. Skull base and vertebrae: No acute fracture. Soft tissues and spinal canal: No prevertebral fluid or swelling. No visible canal hematoma. Disc levels: Multilevel facet arthrosis. Upper chest: No pneumothorax, pulmonary nodule or pleural effusion. Other: Normal visualized paraspinal cervical soft tissues. IMPRESSION: 1. No acute intracranial abnormality. 2. Persistent large right frontal scalp hematoma without skull fracture. 3. Right periorbital laceration/soft tissue swelling without intraorbital abnormality. 4. Increased bilateral facial edema. 5. No facial fracture. 6. No acute fracture or static subluxation of the cervical spine. Electronically Signed   By: Ulyses Jarred M.D.   On: 05/08/2020 03:58   CT MAXILLOFACIAL WO CONTRAST  Result Date: 05/27/2020 CLINICAL DATA:  Fall EXAM: CT HEAD WITHOUT CONTRAST CT MAXILLOFACIAL WITHOUT CONTRAST CT CERVICAL SPINE WITHOUT CONTRAST TECHNIQUE: Multidetector CT imaging of the head, cervical spine, and maxillofacial structures were performed using the standard protocol without intravenous contrast. Multiplanar CT image reconstructions of the cervical spine and maxillofacial structures were also generated. COMPARISON:  None. FINDINGS: CT HEAD FINDINGS Brain: There is no mass, hemorrhage or extra-axial collection. The size and configuration of the ventricles and extra-axial CSF spaces are normal. The brain parenchyma is normal, without evidence of acute or chronic infarction. Vascular: Atherosclerotic calcification of the internal carotid arteries at the skull base. No abnormal hyperdensity of the major intracranial arteries or dural venous sinuses. Skull: Large right frontal scalp hematoma.  No skull fracture. CT MAXILLOFACIAL FINDINGS Osseous: --Complex facial fracture types: No LeFort, zygomaticomaxillary complex or nasoorbitoethmoidal fracture. --Simple fracture types: None. --Mandible: No fracture or dislocation.  Orbits: The globes are intact. Normal appearance of the intra- and extraconal fat. Symmetric extraocular muscles and optic nerves. Sinuses: No fluid levels or advanced mucosal thickening. Soft tissues: Normal visualized extracranial soft tissues. CT CERVICAL SPINE FINDINGS Alignment: Grade 1 anterolisthesis at C4-5 and C5-6 secondary to facet arthrosis. Facets are aligned. Occipital condyles and the lateral masses of C1-C2 are aligned. Skull base and vertebrae: No acute fracture. Soft tissues and spinal canal: No prevertebral fluid or swelling. No visible canal hematoma. Disc levels: Multilevel facet arthrosis. Upper chest: No pneumothorax, pulmonary nodule or pleural effusion. Other: Normal visualized paraspinal cervical soft tissues. IMPRESSION: 1. No acute intracranial abnormality. 2. Large right frontal scalp hematoma without skull fracture or facial fracture. 3. No acute fracture or static subluxation of the cervical spine. Electronically Signed   By: Ulyses Jarred M.D.   On: 05/27/2020 01:45   CT Temporal Bones Wo Contrast  Result  Date: 05/08/2020 CLINICAL DATA:  Fall. Hit head on table. Right frontal hematoma. Possible CSF leak. EXAM: CT TEMPORAL BONES WITHOUT CONTRAST TECHNIQUE: Axial and coronal plane CT imaging of the petrous temporal bones was performed with thin-collimation image reconstruction. No intravenous contrast was administered. Multiplanar CT image reconstructions were also generated. COMPARISON:  CT of the head, face, and cervical spine 05/25/2020 and 05/27/2020. FINDINGS: The visualized intracranial contents are within normal limits. Visualized paranasal sinuses are clear. Known right frontal scalp hematoma is not imaged. Asymmetric stranding is present over the visualized right face extending to the periauricular area on the right without underlying fracture. Vascular calcifications are present without hyperdense vessels The right external auditory canal is within normal limits. The  tympanic membrane is visualized and appears to be intact. The middle ear ossicles are normally formed and articulating. Middle ear cavity is clear. Epitympanum unremarkable. The mastoid air cells are clear. Oval window is patent. The inner ear structures are normally formed. The superior semicircular canal is covered. The internal auditory canal and vestibular aqueduct are within normal limits. The left external auditory canal is within normal limits. The tympanic membrane is visualized and appears to be intact. The middle ear ossicles are normally formed and articulating. Middle ear cavity is clear. Epitympanum unremarkable. The mastoid air cells are clear. Oval window is patent. The inner ear structures are normally formed. The superior semicircular canal is covered. The internal auditory canal and vestibular aqueduct are within normal limits. IMPRESSION: 1. Asymmetric stranding over the visualized right face extending to the periauricular area without underlying fracture. 2. Normal CT appearance of the temporal bones bilaterally. 3. Known right frontal scalp hematoma is not imaged. Electronically Signed   By: San Morelle M.D.   On: 06/01/2020 06:13    Positive ROS: All other systems have been reviewed and were otherwise negative with the exception of those mentioned in the HPI and as above.  Physical Exam: General:  Alert, no acute distress Psychiatric:  Patient is competent for consent with normal mood and affect   Cardiovascular:  No pedal edema Respiratory:  No wheezing, non-labored breathing GI:  Abdomen is soft and non-tender Skin:  No lesions in the area of chief complaint Neurologic:  Sensation intact distally Lymphatic:  No axillary or cervical lymphadenopathy  Orthopedic Exam:  Orthopedic examination is limited to the right foot and lower leg.  The patient's foot is in a posterior splint.  The splint appears to be in good condition.  The skin is intact at the proximal distal ends  of the splint.  She has mild-moderate tenderness to palpation over the medial and lateral aspects of the ankle, as well as the dorsal aspect of the right foot.  She is able dorsiflex and plantarflex her toes.  Sensation is intact light touch to all distributions.  She has fair capillary refill to her right foot, which is warm to touch.  X-rays:  Recent x-rays of the right ankle are available for review and have been reviewed by myself.  These films demonstrate a small minimally displaced avulsion fracture off the dorsal aspect of the talar head, but otherwise is unremarkable.  The ankle mortise is anatomic.  No other acute bony abnormalities are identified.  Assessment: Probable right ankle sprain with acute avulsion fracture of dorsum of talar head, right foot.  Plan: The treatment options have been discussed with the patient.  The patient has been re-assured that this injury does not require surgical intervention.  The patient will be  converted to a cam walker boot over the next 1 to 2 days.  Once this is on, she may begin to weight-bear as tolerated on her right foot, using her walker for balance and support.  Meanwhile, the patient should keep her foot elevated as much as possible and have ice applied to her ankle frequently.  Thank you for asking me to participate in the care of this most pleasant yet unfortunate woman.  We will be happy to follow her with you.   Pascal Lux, MD  Beeper #:  [phone]  05/14/2020 4:21 PM

## 2020-05-28 NOTE — ED Notes (Addendum)
Pt' head bandaged with non stick and gauze. Pt states no pain at this time. Pt's left hand rewrapped with clean gauze. Pt's call bell within reach. Pt resting now.

## 2020-05-28 NOTE — ED Notes (Signed)
Daughter called and provided with an update, while RN was in pts room.

## 2020-05-28 NOTE — ED Notes (Signed)
Pt's brief and purewick checked for any incontinence around the purewick

## 2020-05-28 NOTE — ED Notes (Signed)
Introduced myself to the patient. Fall pads were placed by this Probation officer on the floor. Patient requested ice chips, but was asleep when this writer went back into the room with the ice chips. Daughter is now at bedside.

## 2020-05-28 NOTE — ED Notes (Signed)
Spoke with pt's daughter and updated on pt's status and plan of care.  Daughter stated pt has bad swelling that happens in bilateral lower extremities. Will inform Admit MD

## 2020-05-28 NOTE — ED Provider Notes (Signed)
[facility] Emergency Department Provider Note  ____________________________________________   First MD Initiated Contact with Patient 05/17/2020 [phone]     (approximate)  I have reviewed the triage vital signs and the nursing notes.   HISTORY  Chief Complaint Fall    HPI Sabrina Small is a 86 y.o. female with medical history as listed below who presents by EMS for evaluation after a fall.  She was seen about 24 hours ago after another mechanical fall and had a skin tear to her left wrist and a contusion on her forehead.  However since that time she has developed severe swelling and bruising all around her face and eyes to the point that her eyes were swollen shut.  She got up tonight to try to go to the bathroom and she says she thought she knew where everything was but she clearly did not, reached out and was not able to catch herself and fell.  She is not certain what she hit but she has a large laceration on her forehead.  She does not believe she lost consciousness.  She has no neck pain but has a headache and forehead pain around site of the laceration.  Additionally she has pain in her right ankle and left wrist although this was also painful yesterday.  She denies chest pain, neck pain, nausea, vomiting, abdominal pain, and dysuria.  Moving around or pushing on her face makes it worse and nothing in particular makes it better.   The onset of the fall was acute and the episode was severe.        Past Medical History:  Diagnosis Date  . Allergy   . Arthritis   . Bell's palsy   . CHF (congestive heart failure) ([place])   . Colon polyps   . Depression   . Diabetes mellitus without complication ([place])   . Diabetes mellitus, type II ([place])   . Diverticulitis   . Gout   . Hypothyroidism   . Sleep apnea     Patient Active Problem List   Diagnosis Date Noted  . Recurrent falls 05/23/2020  . PAD (peripheral artery disease) ([place]) 05/14/2020  . Abnormal  findings on diagnostic imaging of liver and biliary tract 11/22/2018  . Anxiety 11/22/2018  . Acquired hallux valgus 11/22/2018  . Congestive heart failure ([place]) 11/22/2018  . Constipation 11/22/2018  . Diabetic neuropathy ([place]) 11/22/2018  . Drug-induced diabetes mellitus ([place]) 11/22/2018  . Essential hypertension 11/22/2018  . Obesity 11/22/2018  . Major depression, single episode 11/22/2018  . Intertrigo 11/22/2018  . Thrombocytopenia ([place]) 11/22/2018  . Tear film insufficiency 11/22/2018  . Primary osteoarthritis 11/22/2018  . Abdominal distension   . COPD (chronic obstructive pulmonary disease) ([place]) 09/03/2018  . Hypoglycemia 09/02/2018  . Hyperlipidemia 09/02/2018  . COPD with acute exacerbation ([place]) 09/02/2018  . Tremor of unknown origin 09/02/2018  . Acute diastolic CHF (congestive heart failure) ([place]) 09/02/2018  . Acute respiratory failure with hypoxia ([place]) 09/02/2018  . Insulin overdose   . Venous ulcer of ankle, right ([place]) 07/16/2018  . Chronic venous insufficiency 07/16/2018  . Lymphedema 07/16/2018  . Osteoarthritis of left knee 03/22/2018  . History of total knee arthroplasty 03/22/2018  . Hyperlipidemia due to type 2 diabetes mellitus ([place]) 09/06/2017  . Vitamin D deficiency 09/06/2017  . Chronic ITP (idiopathic thrombocytopenia) ([facility]) 03/22/2017  . Type 2 diabetes mellitus with diabetic foot deformity ([place]) 03/08/2017  . Gout 03/08/2017  . Hypothyroidism 03/08/2017  . Sepsis ([place]) 11/24/2016  Past Surgical History:  Procedure Laterality Date  . ABDOMINAL HYSTERECTOMY    . CHOLECYSTECTOMY    . COLONOSCOPY WITH PROPOFOL N/A 02/04/2017   Procedure: COLONOSCOPY WITH PROPOFOL;  Surgeon: Lollie Sails, MD;  Location: [facility] ENDOSCOPY;  Service: Endoscopy;  Laterality: N/A;  . HERNIA REPAIR      Prior to Admission medications   Medication Sig Start Date End Date Taking? Authorizing Provider  Acetaminophen (TYLENOL EXTRA STRENGTH PO) Take 2 tablets by  mouth 2 (two) times a week. In the morning every Monday and Wednesday for pain   Yes [provider]  albuterol (VENTOLIN HFA) 108 (90 Base) MCG/ACT inhaler Inhale 2 puffs into the lungs every 6 (six) hours as needed for shortness of breath.    Yes [provider]  Albuterol Sulfate 2.5 MG/0.5ML NEBU Inhale 1 each into the lungs in the morning and at bedtime.   Yes [provider]  allopurinol (ZYLOPRIM) 100 MG tablet Take 100 mg by mouth daily.   Yes [provider]  amLODipine (NORVASC) 10 MG tablet Take 10 mg by mouth daily.   Yes [provider]  aspirin EC 81 MG tablet Take 81 mg by mouth every other day.    Yes [provider]  azelastine (ASTELIN) 0.1 % nasal spray Place 2 sprays into both nostrils 2 (two) times daily as needed (daily and as needed). Use in each nostril as directed    Yes [provider]  bisacodyl (DULCOLAX) 5 MG EC tablet Take 5 mg by mouth 2 (two) times daily as needed for moderate constipation.    Yes [provider]  buPROPion (WELLBUTRIN XL) 150 MG 24 hr tablet Take 150 mg by mouth daily.   Yes [provider]  dicyclomine (BENTYL) 10 MG capsule Take 10 mg by mouth daily as needed for spasms (abdominal pain).   Yes [provider]  DULoxetine (CYMBALTA) 20 MG capsule Take 20 mg by mouth daily.   Yes [provider]  Emollient (GOLD BOND MEDICATED BODY) 5-0.5 % LOTN Apply 1 Dose topically daily.   Yes [provider]  fenofibrate micronized (LOFIBRA) 200 MG capsule Take 200 mg by mouth daily.   Yes [provider]  fluticasone-salmeterol (ADVAIR HFA) 230-21 MCG/ACT inhaler Inhale 2 puffs into the lungs 2 (two) times daily. Rinse mouth with water after use   Yes [provider]  hydrOXYzine (ATARAX/VISTARIL) 25 MG tablet Take 25 mg by mouth 3 (three) times daily as needed.   Yes [provider]  insulin aspart (NOVOLOG) 100 UNIT/ML FlexPen  Inject 8 Units into the skin 3 (three) times daily before meals. And As needed if BG >200. Inject as per sliding scale if 200-250=2; 251-300=4; 301-350=6; 351-400=8; 401+=10   Yes [provider]  Insulin Detemir (LEVEMIR FLEXTOUCH) 100 UNIT/ML Pen Inject 40 Units into the skin at bedtime.    Yes [provider]  levothyroxine (SYNTHROID, LEVOTHROID) 75 MCG tablet Take 75 mcg by mouth daily before breakfast.    Yes [provider]  linaclotide (LINZESS) 145 MCG CAPS capsule Take 1 capsule (145 mcg total) by mouth daily before breakfast. 09/05/18  Yes Tat, Shanon Brow, MD  liver oil-zinc oxide (DESITIN) 40 % ointment Apply 1 application topically every 3 (three) days. Every 12 hours and as needed   Yes [provider]  loperamide (IMODIUM A-D) 2 MG tablet Take 2 mg by mouth every 8 (eight) hours as needed for diarrhea or loose stools.   Yes  [provider]  loratadine (CLARITIN) 5 MG chewable tablet Chew 5 mg by mouth daily as needed.    Yes [provider]  meloxicam (MOBIC) 7.5 MG tablet Take 7.5 mg by mouth 2 (two) times a week. Every Monday and Friday for pain   Yes [provider]  metoprolol succinate (TOPROL-XL) 50 MG 24 hr tablet Take 1 tablet (50 mg total) by mouth daily. Take with or immediately following a meal. 09/05/18  Yes Tat, David, MD  nystatin cream (MYCOSTATIN) Apply 1 application topically 2 (two) times daily. Apply to Bilateral Groin for 4 weeks 04/28/20  Yes [provider]  Omega-3 Fatty Acids (FISH OIL) 1000 MG CAPS Take 1,000 mg by mouth 2 (two) times daily.    Yes [provider]  omeprazole (PRILOSEC) 20 MG capsule Take 20 mg by mouth daily.   Yes [provider]  Pitavastatin Calcium (LIVALO) 2 MG TABS Take 2 mg by mouth every evening.    Yes [provider]  polyethylene glycol (MIRALAX / GLYCOLAX) packet Take 17 g by mouth daily as needed.    Yes [provider]  polyvinyl  alcohol (LIQUIFILM TEARS) 1.4 % ophthalmic solution Place 2 drops into both eyes in the morning and at bedtime.   Yes [provider]  potassium chloride (K-DUR) 10 MEQ tablet Take 10 mEq by mouth every Monday, Wednesday, and Friday.  09/07/18  Yes [provider]  pregabalin (LYRICA) 150 MG capsule Take 1 capsule (150 mg total) by mouth daily. 09/05/18  Yes Tat, Shanon Brow, MD  senna (SENOKOT) 8.6 MG TABS tablet Take 2 tablets by mouth daily.    Yes [provider]  torsemide (DEMADEX) 20 MG tablet Take 6 tablets (120 mg total) by mouth daily. Patient taking differently: Take 60 mg by mouth daily.  09/05/18  Yes Tat, Shanon Brow, MD  traMADol (ULTRAM) 50 MG tablet Take 50 mg by mouth 2 (two) times a week. Every Monday and Wednesday, Give 30 minutes before showers   Yes [provider]  traMADol (ULTRAM) 50 MG tablet Take 1 tablet (50 mg total) by mouth every 6 (six) hours as needed. 05/27/20  Yes Paulette Blanch, MD  Vitamin D, Ergocalciferol, (DRISDOL) 50000 units CAPS capsule Take 50,000 Units by mouth every 30 (thirty) days.    Yes [provider]  acetaminophen (TYLENOL) 325 MG tablet Take 650 mg by mouth 2 (two) times a week. Patient not taking: Reported on 05/12/2020    [provider]  BD AUTOSHIELD DUO 30G X 5 MM MISC USE WITH LEVEMIR AND NOVOLOG 12/25/18   Crecencio Mc, MD  fluconazole (DIFLUCAN) 150 MG tablet Take 150 mg by mouth every other day. Patient not taking: Reported on 05/27/2020    [provider]  Fluticasone-Salmeterol (ADVAIR DISKUS) 250-50 MCG/DOSE AEPB USE 1 PUFF VIA INHALATION 2 TIMES A DAY Patient not taking: Reported on 06/01/2020    [provider]  fluticasone-salmeterol (ADVAIR HFA) 230-21 MCG/ACT inhaler 2 puffs Patient not taking: Reported on 05/26/2020    [provider]  hydroxypropyl methylcellulose / hypromellose (ISOPTO TEARS / GONIOVISC) 2.5 % ophthalmic solution Place 2 drops into both eyes 2 (two)  times daily. Patient not taking: Reported on 05/29/2020    [provider]  traMADol (ULTRAM) 50 MG tablet Take 50 mg by mouth every 6 (six) hours as needed for moderate pain (and severe pain). Patient not taking: Reported on 05/22/2020    [provider]  Allergies Ciprocin-fluocin-procin  [fluocinolone], Doxycycline, Dust mite mixed allergen ext [mite (d. farinae)], Glipizide, Iodinated diagnostic agents, Iodine, Liraglutide, Lortab [hydrocodone-acetaminophen], Morphine and related, Omnicef [cefdinir], Other, Penicillins, Percocet [oxycodone-acetaminophen], Rosuvastatin, Sulfa antibiotics, Sulfamethoxazole-trimethoprim, Sulfasalazine, Trimethoprim, Trovafloxacin, Vancomycin, Voltaren [diclofenac sodium], and Zolpidem  Family History  Adopted: Yes  Problem Relation Age of Onset  . Mental illness Son     Social History Social History   Tobacco Use  . Smoking status: Former Smoker    Packs/day: 2.00    Years: 30.00    Pack years: 60.00    Quit date: 03/08/1985    Years since quitting: 35.2  . Smokeless tobacco: Never Used  Vaping Use  . Vaping Use: Never used  Substance Use Topics  . Alcohol use: No  . Drug use: No    Review of Systems Constitutional: No fever/chills Eyes: Eyes are swollen shut from injury yesterday. ENT: No sore throat. Cardiovascular: Denies chest pain. Respiratory: Denies shortness of breath. Gastrointestinal: No abdominal pain.  No nausea, no vomiting.  No diarrhea.  No constipation. Genitourinary: Negative for dysuria. Musculoskeletal: Headache and forehead pain, right ankle pain, left wrist pain. Integumentary: Deep laceration to forehead. Neurological: Mild headache, no focal weakness or numbness.   ____________________________________________   PHYSICAL EXAM:  VITAL SIGNS: ED Triage Vitals  Enc Vitals Group     BP 05/06/2020 0302 (!) 149/61     Pulse Rate 05/26/2020 0302 62     Resp 05/06/2020 0302 20     Temp 06/02/2020 0302  98.2 F (36.8 C)     Temp src --      SpO2 05/15/2020 0302 94 %     Weight 05/14/2020 0255 71.7 kg (158 lb)     Height 06/03/2020 0255 1.499 m (4\' 11" )     Head Circumference --      Peak Flow --      Pain Score 05/17/2020 0255 10     Pain Loc --      Pain Edu? --      Excl. in [place]? --     Constitutional: Alert and oriented.  Eyes: Conjunctivae are normal.  Head: Extensive subacute and acute injuries to the patient's face and forehead including a deep laceration along the hairline of the center of her forehead, bilateral periorbital bruising and edema to the point she cannot open her eyes, bruising the tracks down from her face to her neck. Nose: No evidence of epistaxis or clear rhinorrhea. Mouth/Throat: Dried blood is in the patient's mouth but I do not appreciate any acute dental or intraoral injury. Neck: No stridor.  No meningeal signs.  No neck swelling, no bruit. Cardiovascular: Normal rate, regular rhythm. Good peripheral circulation. Grossly normal heart sounds. Respiratory: Normal respiratory effort.  No retractions. Gastrointestinal: Obese.  Soft and nontender. No distention.  Musculoskeletal: There is some swelling tenderness to palpation around the right ankle and she cannot tolerate well any range of motion of the right ankle.  Left wrist is bandaged from prior skin tear from last night ED visit.  There is some mild tenderness to palpation of the hand but no pain with flexion and extension of the wrist. Neurologic:  Normal speech and language. No gross focal neurologic deficits are appreciated.  Skin:  Skin is warm, dry and intact except as noted above, particularly the forehead laceration. Psychiatric: Mood and affect are normal. Speech and behavior are normal.     ____________________________________________   LABS (all labs ordered are listed, but only abnormal results  are displayed)  Labs Reviewed  CBC WITH DIFFERENTIAL/PLATELET - Abnormal; Notable for the following  components:      Result Value   Platelets 122 (*)    All other components within normal limits  COMPREHENSIVE METABOLIC PANEL - Abnormal; Notable for the following components:   Potassium 3.3 (*)    Glucose, Bld 117 (*)    BUN 32 (*)    Creatinine, Ser 1.53 (*)    GFR calc non Af Amer 31 (*)    GFR calc Af Amer 36 (*)    All other components within normal limits  SARS CORONAVIRUS 2 BY RT PCR (HOSPITAL ORDER, [facility])  LACTIC ACID, PLASMA  PROCALCITONIN  PROTIME-INR  CK  URINALYSIS, ROUTINE W REFLEX MICROSCOPIC   ____________________________________________  EKG  ED ECG REPORT I, Hinda Kehr, the attending physician, personally viewed and interpreted this ECG.  Date: 05/27/2020 EKG Time: 02:58 Rate: 63 Rhythm: normal sinus rhythm QRS Axis: normal Intervals: normal ST/T Wave abnormalities: Non-specific ST segment / T-wave changes, but no clear evidence of acute ischemia. Narrative Interpretation: no definitive evidence of acute ischemia; does not meet STEMI criteria.   ____________________________________________  RADIOLOGY I, Hinda Kehr, personally viewed and evaluated these images (plain radiographs) as part of my medical decision making, as well as reviewing the written report by the radiologist. I also discussed the temporal bone CT with the radiologist who interpreted it.  ED MD interpretation: No fracture of the left wrist. Subtle acute dorsal talar avulsion fracture in the right ankle. No intracranial bleeding or evidence of facial or skull fractures on CTs head, cervical spine, and temporal bones.  Official radiology report(s): DG Wrist Complete Left  Result Date: 05/27/2020 CLINICAL DATA:  Left wrist pain after fall.  Also fall yesterday. EXAM: LEFT WRIST - COMPLETE 3+ VIEW COMPARISON:  None. FINDINGS: No evidence of acute fracture. There is slight dorsal tilt of the lunate. Moderate wrist joint osteoarthritis with joint space  narrowing and subchondral cystic change. There is advanced osteoarthritis at the thumb carpal metacarpal joint. No focal soft tissue abnormality. IMPRESSION: 1. No acute fracture or subluxation of the left wrist. 2. Moderate wrist joint osteoarthritis. Advanced osteoarthritis at the thumb carpometacarpal joint. 3. Slight dorsal tilt of the lunate appears chronic. Electronically Signed   By: Keith Rake M.D.   On: 05/23/2020 03:29   DG Ankle Complete Right  Result Date: 05/29/2020 CLINICAL DATA:  Right ankle pain after fall.  Also fall yesterday. EXAM: RIGHT ANKLE - COMPLETE 3+ VIEW COMPARISON:  None. FINDINGS: Curvilinear osseous density adjacent to the dorsal talus is suspicious for acute avulsion injury. No other acute fracture. There is a moderate plantar calcaneal spur. Small ankle joint effusion. Generalized soft tissue edema. IMPRESSION: Findings suspicious for acute dorsal talar avulsion fracture. No other acute fracture of the right ankle. Electronically Signed   By: Keith Rake M.D.   On: 05/11/2020 03:32   CT Head Wo Contrast  Result Date: 06/02/2020 CLINICAL DATA:  Fall EXAM: CT HEAD WITHOUT CONTRAST CT MAXILLOFACIAL WITHOUT CONTRAST CT CERVICAL SPINE WITHOUT CONTRAST TECHNIQUE: Multidetector CT imaging of the head, cervical spine, and maxillofacial structures were performed using the standard protocol without intravenous contrast. Multiplanar CT image reconstructions of the cervical spine and maxillofacial structures were also generated. COMPARISON:  None. FINDINGS: CT HEAD FINDINGS Brain: There is no mass, hemorrhage or extra-axial collection. The size and configuration of the ventricles and extra-axial CSF spaces are normal. The brain parenchyma is normal,  without evidence of acute or chronic infarction. Vascular: No abnormal hyperdensity of the major intracranial arteries or dural venous sinuses. No intracranial atherosclerosis. Skull: Persistent large right frontal scalp hematoma.  No skull fracture. CT MAXILLOFACIAL FINDINGS Osseous: --Complex facial fracture types: No LeFort, zygomaticomaxillary complex or nasoorbitoethmoidal fracture. --Simple fracture types: None. --Mandible: No fracture or dislocation. Orbits: Right periorbital laceration/soft tissue swelling. No intraorbital abnormality. Sinuses: No fluid levels or advanced mucosal thickening. Soft tissues: Bilateral facial subcutaneous edema has increased since 05/27/2020. CT CERVICAL SPINE FINDINGS Alignment: No static subluxation. Facets are aligned. Occipital condyles and the lateral masses of C1-C2 are aligned. Skull base and vertebrae: No acute fracture. Soft tissues and spinal canal: No prevertebral fluid or swelling. No visible canal hematoma. Disc levels: Multilevel facet arthrosis. Upper chest: No pneumothorax, pulmonary nodule or pleural effusion. Other: Normal visualized paraspinal cervical soft tissues. IMPRESSION: 1. No acute intracranial abnormality. 2. Persistent large right frontal scalp hematoma without skull fracture. 3. Right periorbital laceration/soft tissue swelling without intraorbital abnormality. 4. Increased bilateral facial edema. 5. No facial fracture. 6. No acute fracture or static subluxation of the cervical spine. Electronically Signed   By: Ulyses Jarred M.D.   On: 06/01/2020 03:58   CT Cervical Spine Wo Contrast  Result Date: 05/30/2020 CLINICAL DATA:  Fall EXAM: CT HEAD WITHOUT CONTRAST CT MAXILLOFACIAL WITHOUT CONTRAST CT CERVICAL SPINE WITHOUT CONTRAST TECHNIQUE: Multidetector CT imaging of the head, cervical spine, and maxillofacial structures were performed using the standard protocol without intravenous contrast. Multiplanar CT image reconstructions of the cervical spine and maxillofacial structures were also generated. COMPARISON:  None. FINDINGS: CT HEAD FINDINGS Brain: There is no mass, hemorrhage or extra-axial collection. The size and configuration of the ventricles and extra-axial CSF  spaces are normal. The brain parenchyma is normal, without evidence of acute or chronic infarction. Vascular: No abnormal hyperdensity of the major intracranial arteries or dural venous sinuses. No intracranial atherosclerosis. Skull: Persistent large right frontal scalp hematoma. No skull fracture. CT MAXILLOFACIAL FINDINGS Osseous: --Complex facial fracture types: No LeFort, zygomaticomaxillary complex or nasoorbitoethmoidal fracture. --Simple fracture types: None. --Mandible: No fracture or dislocation. Orbits: Right periorbital laceration/soft tissue swelling. No intraorbital abnormality. Sinuses: No fluid levels or advanced mucosal thickening. Soft tissues: Bilateral facial subcutaneous edema has increased since 05/27/2020. CT CERVICAL SPINE FINDINGS Alignment: No static subluxation. Facets are aligned. Occipital condyles and the lateral masses of C1-C2 are aligned. Skull base and vertebrae: No acute fracture. Soft tissues and spinal canal: No prevertebral fluid or swelling. No visible canal hematoma. Disc levels: Multilevel facet arthrosis. Upper chest: No pneumothorax, pulmonary nodule or pleural effusion. Other: Normal visualized paraspinal cervical soft tissues. IMPRESSION: 1. No acute intracranial abnormality. 2. Persistent large right frontal scalp hematoma without skull fracture. 3. Right periorbital laceration/soft tissue swelling without intraorbital abnormality. 4. Increased bilateral facial edema. 5. No facial fracture. 6. No acute fracture or static subluxation of the cervical spine. Electronically Signed   By: Ulyses Jarred M.D.   On: 05/25/2020 03:58   CT Maxillofacial Wo Contrast  Result Date: 05/10/2020 CLINICAL DATA:  Fall EXAM: CT HEAD WITHOUT CONTRAST CT MAXILLOFACIAL WITHOUT CONTRAST CT CERVICAL SPINE WITHOUT CONTRAST TECHNIQUE: Multidetector CT imaging of the head, cervical spine, and maxillofacial structures were performed using the standard protocol without intravenous contrast.  Multiplanar CT image reconstructions of the cervical spine and maxillofacial structures were also generated. COMPARISON:  None. FINDINGS: CT HEAD FINDINGS Brain: There is no mass, hemorrhage or extra-axial collection. The size and configuration of the ventricles and  extra-axial CSF spaces are normal. The brain parenchyma is normal, without evidence of acute or chronic infarction. Vascular: No abnormal hyperdensity of the major intracranial arteries or dural venous sinuses. No intracranial atherosclerosis. Skull: Persistent large right frontal scalp hematoma. No skull fracture. CT MAXILLOFACIAL FINDINGS Osseous: --Complex facial fracture types: No LeFort, zygomaticomaxillary complex or nasoorbitoethmoidal fracture. --Simple fracture types: None. --Mandible: No fracture or dislocation. Orbits: Right periorbital laceration/soft tissue swelling. No intraorbital abnormality. Sinuses: No fluid levels or advanced mucosal thickening. Soft tissues: Bilateral facial subcutaneous edema has increased since 05/27/2020. CT CERVICAL SPINE FINDINGS Alignment: No static subluxation. Facets are aligned. Occipital condyles and the lateral masses of C1-C2 are aligned. Skull base and vertebrae: No acute fracture. Soft tissues and spinal canal: No prevertebral fluid or swelling. No visible canal hematoma. Disc levels: Multilevel facet arthrosis. Upper chest: No pneumothorax, pulmonary nodule or pleural effusion. Other: Normal visualized paraspinal cervical soft tissues. IMPRESSION: 1. No acute intracranial abnormality. 2. Persistent large right frontal scalp hematoma without skull fracture. 3. Right periorbital laceration/soft tissue swelling without intraorbital abnormality. 4. Increased bilateral facial edema. 5. No facial fracture. 6. No acute fracture or static subluxation of the cervical spine. Electronically Signed   By: Ulyses Jarred M.D.   On: 06/01/2020 03:58   CT Temporal Bones Wo Contrast  Result Date: 05/29/2020 CLINICAL  DATA:  Fall. Hit head on table. Right frontal hematoma. Possible CSF leak. EXAM: CT TEMPORAL BONES WITHOUT CONTRAST TECHNIQUE: Axial and coronal plane CT imaging of the petrous temporal bones was performed with thin-collimation image reconstruction. No intravenous contrast was administered. Multiplanar CT image reconstructions were also generated. COMPARISON:  CT of the head, face, and cervical spine 05/07/2020 and 05/27/2020. FINDINGS: The visualized intracranial contents are within normal limits. Visualized paranasal sinuses are clear. Known right frontal scalp hematoma is not imaged. Asymmetric stranding is present over the visualized right face extending to the periauricular area on the right without underlying fracture. Vascular calcifications are present without hyperdense vessels The right external auditory canal is within normal limits. The tympanic membrane is visualized and appears to be intact. The middle ear ossicles are normally formed and articulating. Middle ear cavity is clear. Epitympanum unremarkable. The mastoid air cells are clear. Oval window is patent. The inner ear structures are normally formed. The superior semicircular canal is covered. The internal auditory canal and vestibular aqueduct are within normal limits. The left external auditory canal is within normal limits. The tympanic membrane is visualized and appears to be intact. The middle ear ossicles are normally formed and articulating. Middle ear cavity is clear. Epitympanum unremarkable. The mastoid air cells are clear. Oval window is patent. The inner ear structures are normally formed. The superior semicircular canal is covered. The internal auditory canal and vestibular aqueduct are within normal limits. IMPRESSION: 1. Asymmetric stranding over the visualized right face extending to the periauricular area without underlying fracture. 2. Normal CT appearance of the temporal bones bilaterally. 3. Known right frontal scalp hematoma  is not imaged. Electronically Signed   By: San Morelle M.D.   On: 05/16/2020 06:13    ____________________________________________   PROCEDURES   Procedure(s) performed (including Critical Care):  Marland KitchenMarland KitchenLaceration Repair  Date/Time: 05/15/2020 6:15 AM Performed by: Hinda Kehr, MD Authorized by: Hinda Kehr, MD   Consent:    Consent obtained:  Verbal   Consent given by:  Patient   Risks discussed:  Infection, pain, retained foreign body, poor cosmetic result and poor wound healing Anesthesia (see MAR for exact dosages):  Anesthesia method:  Local infiltration   Local anesthetic:  Lidocaine 2% WITH epi Repair type:    Repair type:  Simple Exploration:    Hemostasis achieved with:  Direct pressure   Wound exploration: entire depth of wound probed and visualized     Contaminated: no   Treatment:    Area cleansed with:  Saline   Amount of cleaning:  Extensive   Irrigation solution:  Sterile saline   Visualized foreign bodies/material removed: no   Skin repair:    Repair method:  Sutures   Suture size:  4-0   Suture technique:  Horizontal mattress, vertical mattress and running   Number of sutures:  11 (1 horizontal mattress, 1 vertical mattress, and 9 passes of running suture ) Approximation:    Approximation:  Close Post-procedure details:    Dressing:  Sterile dressing   Patient tolerance of procedure:  Tolerated well, no immediate complications  .1-3 Lead EKG Interpretation Performed by: Hinda Kehr, MD Authorized by: Hinda Kehr, MD     Interpretation: normal     ECG rate:  70   ECG rate assessment: normal     Rhythm: sinus rhythm     Ectopy: none     Conduction: normal       ____________________________________________   INITIAL IMPRESSION / MDM / ASSESSMENT AND PLAN / ED COURSE  As part of my medical decision making, I reviewed the following data within the [place] notes reviewed and incorporated, Labs  reviewed , EKG interpreted , Old chart reviewed, Radiograph reviewed , Discussed with admitting physician , Discussed with radiologist and Notes from prior ED visits   Differential diagnosis includes, but is not limited to, mechanical fall, syncope, electrolyte or metabolic abnormality, failure to thrive, acute intracranial bleeding, skull fracture, facial fracture, orbital fractures, basilar skull fracture, laceration.  The patient is on the cardiac monitor to evaluate for evidence of arrhythmia and/or significant heart rate changes.  The patient has extensive bruising although the worst of it seems to be subacute from her visit about 24 hours ago. However she has an excessive amount of edema around the face and bruising that is occurred since her initial evaluation. She has a right ankle fracture which likely can be managed conservatively, but she cannot bear weight on it and her eyes are completely swollen shut due to all of the edema from the injury yesterday. She has a degree of chronic kidney disease but no evidence of acute renal failure and her comprehensive metabolic panel and CBC are essentially normal and her CK is normal with no sign of rhabdomyolysis. However it is notable that her hemoglobin has dropped by 1.3 points since her evaluation yesterday and I believe this represents a significant amount of blood that is bleeding into subcutaneous tissue, particularly on her head and face.  She needs hospitalization for stabilization and monitoring of her ongoing medical issues as well as for PT and OT evaluation and likely placement at a rehab facility or skilled nursing facility.  Initially CTs of the head, face, and neck were obtained, but I am sending her back for temporal bone CTs given the extensive raccoon eyes and the concern for possible basilar skull fracture.  I talked to the patient about the extensive laceration on her forehead which I will repair as described in the procedure note  above. She reports that she is up-to-date on her tetanus vaccination and there is no indication for empiric antibiotics.     Clinical  Course as of May 29 819  Wed May 28, 2020  0617 Discussed by phone with radiologist.  No evidence of temporal bone fracture.   [CF]  [phone] Spoke by phone with Dr. Sidney Ace for the hospitalist service.  They will admit.   [CF]  0705 I sent a secure chat text message to Dr. Sidney Ace as well as the admitting physician, Dr. Francine Graven, and strongly encouraged the hospitalist team to consult ophthalmology during the patient's inpatient stay.  Given the degree of facial trauma and swelling around the eyes as well as some purulent discharge collecting in the medial aspect of both eyes, which I noticed while I was suturing her forehead, I think it would be very appropriate to obtain an ophthalmologist's evaluation and opinion.   [CF]    Clinical Course User Index [CF] Hinda Kehr, MD     ____________________________________________  FINAL CLINICAL IMPRESSION(S) / ED DIAGNOSES  Final diagnoses:  Fall, initial encounter  Periorbital edema of both eyes  Facial edema  Facial injury, initial encounter  Forehead trauma, subsequent encounter  Injury of head, initial encounter  Chronic kidney disease, unspecified CKD stage  Closed fracture of right ankle, initial encounter  Contusion of left wrist, initial encounter  Multiple falls     MEDICATIONS GIVEN DURING THIS VISIT:  Medications  lidocaine-EPINEPHrine (XYLOCAINE W/EPI) 2 %-1:100000 (with pres) injection 30 mL (30 mLs Intradermal Given by Other 06/01/2020 9826)     ED Discharge Orders    None      *Please note:  SIREEN HALK was evaluated in Emergency Department on 05/10/2020 for the symptoms described in the history of present illness. She was evaluated in the context of the global COVID-19 pandemic, which necessitated consideration that the patient might be at risk for infection with the SARS-CoV-2 virus  that causes COVID-19. Institutional protocols and algorithms that pertain to the evaluation of patients at risk for COVID-19 are in a state of rapid change based on information released by regulatory bodies including the CDC and federal and state organizations. These policies and algorithms were followed during the patient's care in the ED.  Some ED evaluations and interventions may be delayed as a result of limited staffing during and after the pandemic.*  Note:  This document was prepared using Dragon voice recognition software and may include unintentional dictation errors.   Hinda Kehr, MD 05/12/2020 [phone]

## 2020-05-28 NOTE — ED Triage Notes (Signed)
Pt to ED via EMS from [facility] c/o a fall tonight.  Patient here yesterday for a fall as well.  Pt states tried to get up from chair to go to the bathroom and missed her walker getting up and her right ankle from yesterday's fall gave out on her.  Pt hit head on table tonight but denies LOC.  Presents with large laceration to right forehead, right ankle pain, left wrist pain, bilateral eyes swollen and unable to open them and bruising.  Pt A&Ox4, speaking in complete and coherent sentences, in NAD at this time.

## 2020-05-28 NOTE — ED Notes (Signed)
Pt pulled up in bed and assisted with bedpan.

## 2020-05-28 NOTE — ED Notes (Signed)
Daughter is at bedside. Patient given meal tray.

## 2020-05-28 NOTE — ED Notes (Signed)
Patient called out, states she was out of bed. Patient was found in the stretcher. Patient thought the floor pads were a bed. Patient has some mild confusion.

## 2020-05-29 ENCOUNTER — Inpatient Hospital Stay: Payer: Medicare Other

## 2020-05-29 DIAGNOSIS — R296 Repeated falls: Secondary | ICD-10-CM

## 2020-05-29 DIAGNOSIS — L8990 Pressure ulcer of unspecified site, unspecified stage: Secondary | ICD-10-CM | POA: Insufficient documentation

## 2020-05-29 LAB — CBC
HCT: 33.2 % — ABNORMAL LOW (ref 36.0–46.0)
Hemoglobin: 11.2 g/dL — ABNORMAL LOW (ref 12.0–15.0)
MCH: 31 pg (ref 26.0–34.0)
MCHC: 33.7 g/dL (ref 30.0–36.0)
MCV: 92 fL (ref 80.0–100.0)
Platelets: 109 10*3/uL — ABNORMAL LOW (ref 150–400)
RBC: 3.61 MIL/uL — ABNORMAL LOW (ref 3.87–5.11)
RDW: 15.1 % (ref 11.5–15.5)
WBC: 6.4 10*3/uL (ref 4.0–10.5)
nRBC: 0 % (ref 0.0–0.2)

## 2020-05-29 LAB — URINALYSIS, ROUTINE W REFLEX MICROSCOPIC
Bilirubin Urine: NEGATIVE
Glucose, UA: NEGATIVE mg/dL
Ketones, ur: NEGATIVE mg/dL
Nitrite: NEGATIVE
Protein, ur: 30 mg/dL — AB
Specific Gravity, Urine: 1.011 (ref 1.005–1.030)
pH: 5 (ref 5.0–8.0)

## 2020-05-29 LAB — BASIC METABOLIC PANEL
Anion gap: 14 (ref 5–15)
BUN: 32 mg/dL — ABNORMAL HIGH (ref 8–23)
CO2: 26 mmol/L (ref 22–32)
Calcium: 8.9 mg/dL (ref 8.9–10.3)
Chloride: 98 mmol/L (ref 98–111)
Creatinine, Ser: 1.52 mg/dL — ABNORMAL HIGH (ref 0.44–1.00)
GFR calc Af Amer: 36 mL/min — ABNORMAL LOW (ref >60)
GFR calc non Af Amer: 31 mL/min — ABNORMAL LOW (ref >60)
Glucose, Bld: 176 mg/dL — ABNORMAL HIGH (ref 70–99)
Potassium: 3.4 mmol/L — ABNORMAL LOW (ref 3.5–5.1)
Sodium: 138 mmol/L (ref 135–145)

## 2020-05-29 LAB — GLUCOSE, CAPILLARY
Glucose-Capillary: 176 mg/dL — ABNORMAL HIGH (ref 70–99)
Glucose-Capillary: 152 mg/dL — ABNORMAL HIGH (ref 70–99)
Glucose-Capillary: 159 mg/dL — ABNORMAL HIGH (ref 70–99)
Glucose-Capillary: 165 mg/dL — ABNORMAL HIGH (ref 70–99)
Glucose-Capillary: 196 mg/dL — ABNORMAL HIGH (ref 70–99)

## 2020-05-29 MED ORDER — ALUM & MAG HYDROXIDE-SIMETH 200-200-20 MG/5ML PO SUSP
30.0000 mL | ORAL | Status: DC | PRN
  Administered 2020-05-29 (×2): 30 mL via ORAL
  Filled 2020-05-29 (×2): qty 30

## 2020-05-29 MED ORDER — POTASSIUM CHLORIDE CRYS ER 20 MEQ PO TBCR
40.0000 meq | EXTENDED_RELEASE_TABLET | Freq: Once | ORAL | Status: AC
  Administered 2020-05-29: 40 meq via ORAL
  Filled 2020-05-29: qty 2

## 2020-05-29 MED ORDER — ERYTHROMYCIN 5 MG/GM OP OINT
TOPICAL_OINTMENT | Freq: Every day | OPHTHALMIC | Status: DC
  Administered 2020-05-29 – 2020-05-30 (×2): 1 via OPHTHALMIC
  Filled 2020-05-29: qty 1

## 2020-05-29 NOTE — Progress Notes (Addendum)
PROGRESS NOTE    Sabrina Small  RXV:[id number] DOB: 04/10/1933 DOA: 06/03/2020 PCP: Orvis Brill, [facility]    Brief Narrative:  Sabrina Small is a 86 y.o. female with medical history significant for congestive heart failure, diabetes mellitus, arthritis, depression and diverticulitis who was brought into the emergency room by EMS for evaluation after a fall.  Patient was seen in the emergency room 24 hours prior for a mechanical fall and at that time had a skin tear to her left wrist and a contusion on her forehead.  However since that time she has developed severe swelling and bruising all around her forehead and eyes to the point that her eyes are swollen shut.  Patient states that she got up to go to the bathroom and was trying to reach for her walker when she fell.  She is not certain what she hit but has a large laceration on her forehead.  She does not believe she lost consciousness and denies having any dizziness or lightheadedness.  She complains of pain in her right ankle and left wrist. She denies having any chest pain, shortness of breath, nausea, vomiting, dizziness or lightheadedness. Right ankle x-ray shows findings suspicious for acute dorsal talar avulsion fracture. No other acute fracture of the right ankle. CT scan of cervical spine shows persistent large right frontal scalp hematoma without skull Fracture. Right periorbital laceration/soft tissue swelling without intraorbital abnormality. Increased bilateral facial edema. No facial fracture.      Consultants:  Orthopedics, ophthalmology  Procedures:   Antimicrobials:       Subjective: Pt laying in bed. Eyes swollen and closed. Reports not hungry.  Objective: Vitals:   05/29/20 0830 05/29/20 0832 05/29/20 1104 05/29/20 1150  BP:   (!) 166/61 (!) 142/68  Pulse:    73  Resp:    16  Temp:    98.5 F (36.9 C)  TempSrc:    Oral  SpO2: (!) 89% 96%  100%  Weight:      Height:        Intake/Output  Summary (Last 24 hours) at 05/29/2020 1527 Last data filed at 05/29/2020 0500 Gross per 24 hour  Intake 240 ml  Output 1300 ml  Net -1060 ml   Filed Weights   05/09/2020 0255  Weight: 71.7 kg    Examination:  General exam: Appears calm and comfortable , nad, not too cooperative with exam Heent: facial swelling, eyes shot. ecchymosis Respiratory system: Clear to auscultation. With poor respiratory effort normal. Cardiovascular system: S1 & S2 heard, RRR. No JVD, murmurs, rubs, gallops or clicks.  Gastrointestinal system: Abdomen is nondistended, soft and nontender. Normal bowel sounds heard. Central nervous system: hard to assess , as pt Is not cooperative Extremities: no edema Skin: warm, dry Psychiatry:  Mood & affect appropriate in current setting.     Data Reviewed: I have personally reviewed following labs and imaging studies  CBC: Recent Labs  Lab 05/30/2020 0320 05/29/20 0623  WBC 5.3 6.4  NEUTROABS 3.1  --   HGB 12.7 11.2*  HCT 36.9 33.2*  MCV 91.6 92.0  PLT 122* 509*   Basic Metabolic Panel: Recent Labs  Lab 05/17/2020 0320 05/29/20 0623  NA 139 138  K 3.3* 3.4*  CL 99 98  CO2 29 26  GLUCOSE 117* 176*  BUN 32* 32*  CREATININE 1.53* 1.52*  CALCIUM 9.2 8.9   GFR: Estimated Creatinine Clearance: 23.3 mL/min (A) (by C-G formula based on SCr of 1.52 mg/dL (H)). Liver Function  Tests: Recent Labs  Lab 05/11/2020 0320  AST 29  ALT 16  ALKPHOS 62  BILITOT 1.0  PROT 6.8  ALBUMIN 4.0   No results for input(s): LIPASE, AMYLASE in the last 168 hours. No results for input(s): AMMONIA in the last 168 hours. Coagulation Profile: Recent Labs  Lab 05/11/2020 0320  INR 1.2   Cardiac Enzymes: Recent Labs  Lab 05/27/2020 0320  CKTOTAL 109   BNP (last 3 results) No results for input(s): PROBNP in the last 8760 hours. HbA1C: No results for input(s): HGBA1C in the last 72 hours. CBG: Recent Labs  Lab 05/12/2020 1429 05/27/2020 1729 05/29/20 0105 05/29/20 0746  05/29/20 1147  GLUCAP 99 140* 176* 152* 159*   Lipid Profile: No results for input(s): CHOL, HDL, LDLCALC, TRIG, CHOLHDL, LDLDIRECT in the last 72 hours. Thyroid Function Tests: No results for input(s): TSH, T4TOTAL, FREET4, T3FREE, THYROIDAB in the last 72 hours. Anemia Panel: No results for input(s): VITAMINB12, FOLATE, FERRITIN, TIBC, IRON, RETICCTPCT in the last 72 hours. Sepsis Labs: Recent Labs  Lab 05/12/2020 0320 05/21/2020 0412  PROCALCITON <0.10  --   LATICACIDVEN  --  1.5    Recent Results (from the past 240 hour(s))  SARS Coronavirus 2 by RT PCR (hospital order, performed in [facility] hospital lab) Nasopharyngeal Nasopharyngeal Swab     Status: None   Collection Time: 05/07/2020  4:12 AM   Specimen: Nasopharyngeal Swab  Result Value Ref Range Status   SARS Coronavirus 2 NEGATIVE NEGATIVE Final    Comment: (NOTE) SARS-CoV-2 target nucleic acids are NOT DETECTED.  The SARS-CoV-2 RNA is generally detectable in upper and lower respiratory specimens during the acute phase of infection. The lowest concentration of SARS-CoV-2 viral copies this assay can detect is 250 copies / mL. A negative result does not preclude SARS-CoV-2 infection and should not be used as the sole basis for treatment or other patient management decisions.  A negative result may occur with improper specimen collection / handling, submission of specimen other than nasopharyngeal swab, presence of viral mutation(s) within the areas targeted by this assay, and inadequate number of viral copies (<250 copies / mL). A negative result must be combined with clinical observations, patient history, and epidemiological information.  Fact Sheet for Patients:   StrictlyIdeas.no  Fact Sheet for Healthcare Providers: BankingDealers.co.za  This test is not yet approved or  cleared by the [place] FDA and has been authorized for detection and/or diagnosis of  SARS-CoV-2 by FDA under an Emergency Use Authorization (EUA).  This EUA will remain in effect (meaning this test can be used) for the duration of the COVID-19 declaration under Section 564(b)(1) of the Act, 21 U.S.C. section 360bbb-3(b)(1), unless the authorization is terminated or revoked sooner.  Performed at [facility], [address], [place], [place] [address]          Radiology Studies: DG Wrist Complete Left  Result Date: 05/20/2020 CLINICAL DATA:  Left wrist pain after fall.  Also fall yesterday. EXAM: LEFT WRIST - COMPLETE 3+ VIEW COMPARISON:  None. FINDINGS: No evidence of acute fracture. There is slight dorsal tilt of the lunate. Moderate wrist joint osteoarthritis with joint space narrowing and subchondral cystic change. There is advanced osteoarthritis at the thumb carpal metacarpal joint. No focal soft tissue abnormality. IMPRESSION: 1. No acute fracture or subluxation of the left wrist. 2. Moderate wrist joint osteoarthritis. Advanced osteoarthritis at the thumb carpometacarpal joint. 3. Slight dorsal tilt of the lunate appears chronic. Electronically Signed  By: Keith Rake M.D.   On: 05/10/2020 03:29   DG Ankle Complete Right  Result Date: 05/06/2020 CLINICAL DATA:  Right ankle pain after fall.  Also fall yesterday. EXAM: RIGHT ANKLE - COMPLETE 3+ VIEW COMPARISON:  None. FINDINGS: Curvilinear osseous density adjacent to the dorsal talus is suspicious for acute avulsion injury. No other acute fracture. There is a moderate plantar calcaneal spur. Small ankle joint effusion. Generalized soft tissue edema. IMPRESSION: Findings suspicious for acute dorsal talar avulsion fracture. No other acute fracture of the right ankle. Electronically Signed   By: Keith Rake M.D.   On: 05/17/2020 03:32   CT Head Wo Contrast  Result Date: 05/27/2020 CLINICAL DATA:  Fall EXAM: CT HEAD WITHOUT CONTRAST CT MAXILLOFACIAL WITHOUT CONTRAST CT CERVICAL SPINE WITHOUT CONTRAST  TECHNIQUE: Multidetector CT imaging of the head, cervical spine, and maxillofacial structures were performed using the standard protocol without intravenous contrast. Multiplanar CT image reconstructions of the cervical spine and maxillofacial structures were also generated. COMPARISON:  None. FINDINGS: CT HEAD FINDINGS Brain: There is no mass, hemorrhage or extra-axial collection. The size and configuration of the ventricles and extra-axial CSF spaces are normal. The brain parenchyma is normal, without evidence of acute or chronic infarction. Vascular: No abnormal hyperdensity of the major intracranial arteries or dural venous sinuses. No intracranial atherosclerosis. Skull: Persistent large right frontal scalp hematoma. No skull fracture. CT MAXILLOFACIAL FINDINGS Osseous: --Complex facial fracture types: No LeFort, zygomaticomaxillary complex or nasoorbitoethmoidal fracture. --Simple fracture types: None. --Mandible: No fracture or dislocation. Orbits: Right periorbital laceration/soft tissue swelling. No intraorbital abnormality. Sinuses: No fluid levels or advanced mucosal thickening. Soft tissues: Bilateral facial subcutaneous edema has increased since 05/27/2020. CT CERVICAL SPINE FINDINGS Alignment: No static subluxation. Facets are aligned. Occipital condyles and the lateral masses of C1-C2 are aligned. Skull base and vertebrae: No acute fracture. Soft tissues and spinal canal: No prevertebral fluid or swelling. No visible canal hematoma. Disc levels: Multilevel facet arthrosis. Upper chest: No pneumothorax, pulmonary nodule or pleural effusion. Other: Normal visualized paraspinal cervical soft tissues. IMPRESSION: 1. No acute intracranial abnormality. 2. Persistent large right frontal scalp hematoma without skull fracture. 3. Right periorbital laceration/soft tissue swelling without intraorbital abnormality. 4. Increased bilateral facial edema. 5. No facial fracture. 6. No acute fracture or static  subluxation of the cervical spine. Electronically Signed   By: Ulyses Jarred M.D.   On: 05/30/2020 03:58   CT Cervical Spine Wo Contrast  Result Date: 05/15/2020 CLINICAL DATA:  Fall EXAM: CT HEAD WITHOUT CONTRAST CT MAXILLOFACIAL WITHOUT CONTRAST CT CERVICAL SPINE WITHOUT CONTRAST TECHNIQUE: Multidetector CT imaging of the head, cervical spine, and maxillofacial structures were performed using the standard protocol without intravenous contrast. Multiplanar CT image reconstructions of the cervical spine and maxillofacial structures were also generated. COMPARISON:  None. FINDINGS: CT HEAD FINDINGS Brain: There is no mass, hemorrhage or extra-axial collection. The size and configuration of the ventricles and extra-axial CSF spaces are normal. The brain parenchyma is normal, without evidence of acute or chronic infarction. Vascular: No abnormal hyperdensity of the major intracranial arteries or dural venous sinuses. No intracranial atherosclerosis. Skull: Persistent large right frontal scalp hematoma. No skull fracture. CT MAXILLOFACIAL FINDINGS Osseous: --Complex facial fracture types: No LeFort, zygomaticomaxillary complex or nasoorbitoethmoidal fracture. --Simple fracture types: None. --Mandible: No fracture or dislocation. Orbits: Right periorbital laceration/soft tissue swelling. No intraorbital abnormality. Sinuses: No fluid levels or advanced mucosal thickening. Soft tissues: Bilateral facial subcutaneous edema has increased since 05/27/2020. CT CERVICAL SPINE FINDINGS Alignment: No  static subluxation. Facets are aligned. Occipital condyles and the lateral masses of C1-C2 are aligned. Skull base and vertebrae: No acute fracture. Soft tissues and spinal canal: No prevertebral fluid or swelling. No visible canal hematoma. Disc levels: Multilevel facet arthrosis. Upper chest: No pneumothorax, pulmonary nodule or pleural effusion. Other: Normal visualized paraspinal cervical soft tissues. IMPRESSION: 1. No  acute intracranial abnormality. 2. Persistent large right frontal scalp hematoma without skull fracture. 3. Right periorbital laceration/soft tissue swelling without intraorbital abnormality. 4. Increased bilateral facial edema. 5. No facial fracture. 6. No acute fracture or static subluxation of the cervical spine. Electronically Signed   By: Ulyses Jarred M.D.   On: 05/21/2020 03:58   CT Maxillofacial Wo Contrast  Result Date: 05/23/2020 CLINICAL DATA:  Fall EXAM: CT HEAD WITHOUT CONTRAST CT MAXILLOFACIAL WITHOUT CONTRAST CT CERVICAL SPINE WITHOUT CONTRAST TECHNIQUE: Multidetector CT imaging of the head, cervical spine, and maxillofacial structures were performed using the standard protocol without intravenous contrast. Multiplanar CT image reconstructions of the cervical spine and maxillofacial structures were also generated. COMPARISON:  None. FINDINGS: CT HEAD FINDINGS Brain: There is no mass, hemorrhage or extra-axial collection. The size and configuration of the ventricles and extra-axial CSF spaces are normal. The brain parenchyma is normal, without evidence of acute or chronic infarction. Vascular: No abnormal hyperdensity of the major intracranial arteries or dural venous sinuses. No intracranial atherosclerosis. Skull: Persistent large right frontal scalp hematoma. No skull fracture. CT MAXILLOFACIAL FINDINGS Osseous: --Complex facial fracture types: No LeFort, zygomaticomaxillary complex or nasoorbitoethmoidal fracture. --Simple fracture types: None. --Mandible: No fracture or dislocation. Orbits: Right periorbital laceration/soft tissue swelling. No intraorbital abnormality. Sinuses: No fluid levels or advanced mucosal thickening. Soft tissues: Bilateral facial subcutaneous edema has increased since 05/27/2020. CT CERVICAL SPINE FINDINGS Alignment: No static subluxation. Facets are aligned. Occipital condyles and the lateral masses of C1-C2 are aligned. Skull base and vertebrae: No acute fracture.  Soft tissues and spinal canal: No prevertebral fluid or swelling. No visible canal hematoma. Disc levels: Multilevel facet arthrosis. Upper chest: No pneumothorax, pulmonary nodule or pleural effusion. Other: Normal visualized paraspinal cervical soft tissues. IMPRESSION: 1. No acute intracranial abnormality. 2. Persistent large right frontal scalp hematoma without skull fracture. 3. Right periorbital laceration/soft tissue swelling without intraorbital abnormality. 4. Increased bilateral facial edema. 5. No facial fracture. 6. No acute fracture or static subluxation of the cervical spine. Electronically Signed   By: Ulyses Jarred M.D.   On: 05/27/2020 03:58   CT Temporal Bones Wo Contrast  Result Date: 05/10/2020 CLINICAL DATA:  Fall. Hit head on table. Right frontal hematoma. Possible CSF leak. EXAM: CT TEMPORAL BONES WITHOUT CONTRAST TECHNIQUE: Axial and coronal plane CT imaging of the petrous temporal bones was performed with thin-collimation image reconstruction. No intravenous contrast was administered. Multiplanar CT image reconstructions were also generated. COMPARISON:  CT of the head, face, and cervical spine 05/27/2020 and 05/27/2020. FINDINGS: The visualized intracranial contents are within normal limits. Visualized paranasal sinuses are clear. Known right frontal scalp hematoma is not imaged. Asymmetric stranding is present over the visualized right face extending to the periauricular area on the right without underlying fracture. Vascular calcifications are present without hyperdense vessels The right external auditory canal is within normal limits. The tympanic membrane is visualized and appears to be intact. The middle ear ossicles are normally formed and articulating. Middle ear cavity is clear. Epitympanum unremarkable. The mastoid air cells are clear. Oval window is patent. The inner ear structures are normally formed. The superior semicircular canal  is covered. The internal auditory canal and  vestibular aqueduct are within normal limits. The left external auditory canal is within normal limits. The tympanic membrane is visualized and appears to be intact. The middle ear ossicles are normally formed and articulating. Middle ear cavity is clear. Epitympanum unremarkable. The mastoid air cells are clear. Oval window is patent. The inner ear structures are normally formed. The superior semicircular canal is covered. The internal auditory canal and vestibular aqueduct are within normal limits. IMPRESSION: 1. Asymmetric stranding over the visualized right face extending to the periauricular area without underlying fracture. 2. Normal CT appearance of the temporal bones bilaterally. 3. Known right frontal scalp hematoma is not imaged. Electronically Signed   By: San Morelle M.D.   On: 05/11/2020 06:13        Scheduled Meds:  allopurinol  100 mg Oral Daily   amLODipine  10 mg Oral Daily   buPROPion  150 mg Oral Daily   DULoxetine  20 mg Oral Daily   fenofibrate  54 mg Oral Daily   insulin aspart  0-15 Units Subcutaneous TID WC   levothyroxine  75 mcg Oral QAC breakfast   linaclotide  145 mcg Oral QAC breakfast   metoprolol succinate  50 mg Oral Daily   mometasone-formoterol  2 puff Inhalation BID   nystatin cream  1 application Topical BID   omega-3 acid ethyl esters  1 g Oral Daily   pantoprazole  40 mg Oral Daily   pravastatin  40 mg Oral q1800   senna  2 tablet Oral Daily   sodium chloride flush  3 mL Intravenous Q12H   torsemide  60 mg Oral Daily   [START ON 06/05/2020] Vitamin D (Ergocalciferol)  50,000 Units Oral Q30 days   Continuous Infusions:  sodium chloride      Assessment & Plan:   Principal Problem:   Recurrent falls Active Problems:   Hypothyroidism   Chronic ITP (idiopathic thrombocytopenia) (HCC)   Essential hypertension   Closed right ankle fracture   Hypokalemia   Chronic diastolic CHF (congestive heart failure) (HCC)    Pressure injury of skin  Mechanical Recurrent falls She currently has a scalp laceration, significant periorbital swelling and ecchymosis as well as a right ankle fracture Place patient on fall precautions PT/OT  Periorbital Contusion/Eccymosis/Laceration Ophthalmology was consulted, input was appreciated. Per ophthalmology-patient's eyes were essentially spared.  Eye exam benign the subconjunctival hemorrhage will resolve in 1-2 weeks.   No evidence of infection, however with her other injuries, it would be prudent and reasonable to apply antibiotic ointment to her eyes nightly to lubricate her eyes and prevent infection, so long as she is not allergic to said ointment.  Based on her chart, it appears she has no known allergy to erythromycin ointment. small strip of erythromycin ointment be applied in both eyes at bedtime.  Does not tolerate recommend gel eyedrop Systane follow-up with an eye doctor as an outpatient within 1-2 weeks of her hospital discharge with ophthalmology Dr. Neville Route.   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 Ortho consulted , input appreciated-will be fitted with a  cam walker boot,Once this is in place, the patient may be mobilized bed to chair with physical therapy, weightbearing as tolerated on the right foot.  Meanwhile, she is to continue to have ice applied to the ankle and to keep the foot elevated.    Hypertension Blood pressure stable Continue metoprolol and amlodipine   Depression Continue Wellbutrin   Chronic  diastolic dysfunction CHF-euvolemic on exam.  Without exacerbation. Last known LVEF is 60 to 65% Continue torsemide and metoprolol    DVT prophylaxis: SCD Code Status: DO NOT RESUSCITATE Family Communication: updated daughter who is POA about findings, plans, etc. Disposition Plan: Back to previous home environment vs rehab Barrier: needs PT/OT evaluation when able to after cam walker boot fitted. Anticipated d/c: TBD as medically not stable.         LOS: 1 day   Time spent: 45 min with >50% on coc   Nolberto Hanlon, MD [facility] Pager [phone]  If 7PM-7AM, please contact night-coverage [email or web address] Password [facility] 05/29/2020, 3:27 PM

## 2020-05-29 NOTE — Progress Notes (Signed)
Subjective: The patient still notes moderate right ankle pain, but feels that it is somewhat improved versus yesterday.  She is anxious to get out of bed and to get to the bathroom on her own.   Objective: Vital signs in last 24 hours: Temp:  [98 F (36.7 C)-98.5 F (36.9 C)] 98.5 F (36.9 C) (06/24 1150) Pulse Rate:  [71-77] 73 (06/24 1150) Resp:  [14-24] 16 (06/24 1150) BP: (138-166)/(61-74) 142/68 (06/24 1150) SpO2:  [89 %-100 %] 100 % (06/24 1150)  Intake/Output from previous day: 06/23 0701 - 06/24 0700 In: 240 [P.O.:240] Out: 1600 [Urine:1600] Intake/Output this shift: No intake/output data recorded.  Recent Labs    05/07/2020 0320 05/29/20 0623  HGB 12.7 11.2*   Recent Labs    05/17/2020 0320 05/29/20 0623  WBC 5.3 6.4  RBC 4.03 3.61*  HCT 36.9 33.2*  PLT 122* 109*   Recent Labs    05/27/2020 0320 05/29/20 0623  NA 139 138  K 3.3* 3.4*  CL 99 98  CO2 29 26  BUN 32* 32*  CREATININE 1.53* 1.52*  GLUCOSE 117* 176*  CALCIUM 9.2 8.9   Recent Labs    05/24/2020 0320  INR 1.2    Physical Exam: On examination, the splint remains dry and intact.  The skin is intact at the proximal distal ends of the splint.  The swelling appears to be somewhat improved today versus yesterday.  She is able dorsiflex and plantarflex her toes.  Sensations intact light touch to all distributions.  She has good capillary refill to her toes.  Assessment: Right ankle sprain with minimally displaced avulsion fracture off the dorsum of right talar head/neck region.  Plan: The treatment options have been discussed with the patient.  The patient is anxious to go to bed and to begin to weight-bear on this ankle.  I feel that it is safe to do so, so long as she is supported.  Therefore, she will be fitted with a cam walker boot.  Once this is in place, the patient may be mobilized bed to chair with physical therapy, weightbearing as tolerated on the right foot.  Meanwhile, she is to continue to  have ice applied to the ankle and to keep the foot elevated.   [name] Koron Godeaux 05/29/2020, 1:40 PM

## 2020-05-29 NOTE — Progress Notes (Signed)
Pt spoke to daughter, [name] and home staff, Estill Bamberg. She has told them that she has not slept all night, we did not feed her, and that I twisted her ankle. Upon admission to the unit, this RN and NT Tameshia has helped pt on the bedpan as she feels like she needs to hav a BM - unsuccessful at that point.  I offered her some snacks, which she refused, she only wantedr ice chips, which I gave her. I also offered pain medicine as she looked uncomfortable, she agreed and rated her pain level as moderate to severe, thus I gave prn Morphine. After that dose, she appeared more comfortable and slept until about 0450 when she woke up wanting to get out of bed. She has dangled her R leg on the side of her bed and I helped put her R leg back up on the bed and on a pillow to keep it elevated. I have informed her several times of the importance of not bearing weight on the affected leg and of keeping it elevated as much as possible to reduce the swelling.

## 2020-05-29 NOTE — Consult Note (Signed)
  Consult Note  History  Sabrina Small is a 86 y.o. female with a history of diabetes, gout, hypothyroidism, sleep apnea, depression, and CHF who is hospitalized due to a recent fall.  As concerning her eyes, she has significant periorbital and scalp swelling and bruising.  Imaging (CT maxillofacial) indicates that the globes are intact and that there is no orbital fracture.  Due to the proximity of injuries to the eye, and the fact that patient had difficulties with perception yesterday, ophthalmology was consulted.  Patient endorses a history of cataract surgery.  Subjective  Today, patient indicates that her vision is a bit blurrier than baseline (she indicates she does not wear glasses at home).   Objective  Vision: 20/40 OU while wearing +2.50 readers IOP:  20 OD / 16 OS Pupils: Miotic OU Extraocular Movement: Somewhat limited in upgaze due to swelling, otherwise full Confrontation Visual Field: Within normal limits  External: Significant periorbital, facial, and scalp bruising right > left.  Right forehead has hematoma and sutures in place. Lids/lashes: Matting OU; no lacerations. Lagophthalmos of 0.5 mm OD, and 1 mm OS. Conjunctiva: Subconjunctival hemorrhage OD; normal OS Cornea: Inferior PEE. No corneal abrasion seen OU Anterior Chamber: Deep and quiet OU Iris: Within normal limits OU Lens: IOLs OU  Dilation of both eyes performed at approximately 12:25 PM  Vitreous: Clear Fundus: Appears flat and normal, but very difficult exam due to pt cooperation. Periphery: Unable - made multiple attempts - patient did not tolerate dilated exam  Assessment / Plan  1. Dry Eye OU 2. Periorbital Contusion/Eccymosis/Laceration  - Although the patient has significant bruising and swelling of her face and forehead, her eyes were essentially spared. Her vision with non-prescription readers is excellent (20/40). Her eye movements are essentially full, with no concern for entrapment. The  examination of her eyelids and eyeballs is basically benign - the subconjunctival hemorrhage will resolve in 1-2 weeks.    - There is no evidence that her eyes are infected, so antibiotic ointment is not strictly necessary.  However, given her other injuries, it would be prudent and reasonable to apply antibiotic ointment to her eyes nightly to lubricate her eyes and prevent infection, so long as she is not allergic to said ointment.  Based on her chart, it appears she has no known allergy to erythromycin ointment.  I would therefore recommend that a small strip of erythromycin ointment be applied in both eyes at bedtime. If patient does not tolerate this, or wishes a substitute, I recommend a gel eye drop (e.g., Systane) or a lubricating ointment (e.g., lubrifresh).  - I further recommend that patient follow-up with an eye doctor as an outpatient within 1-2 weeks of her hospital discharge.  We would be happy to assist her at [facility] if she finds our assistance convenient.  Please page Ophthalmology or call [phone] with any further questions.  Marchia Meiers, MD Ophthalmology.

## 2020-05-29 NOTE — Progress Notes (Signed)
Pt very upset this AM. She is insisting to get out of bed to go to the bathroom. I have informed her several times that she is on strict bedrest for now due to the avulsion fracture on her R leg. Offered to use the bedpan but she has refused this too. I have called daughter, [name], to help talk to the patient but she got upset with her too. Pt currently refusing Bp checks and AM blood draw. MD informed. Pt remains alert and oriented x4, but insists on '"using the bathroom like a normal person". Will try again later once pt has calmed down.

## 2020-05-29 NOTE — Progress Notes (Signed)
Order received from Dr Roland Rack for an ice pack

## 2020-05-29 NOTE — Progress Notes (Signed)
Patient placed on low bed

## 2020-05-29 NOTE — Consult Note (Signed)
WOC Nurse Consult Note: Reason for Consult:Bilateral uNna boots with venous wound to left lateral lower leg.  Changed Tuesday and Friday. Intact. Daughter at bedside and agrees to changing tomorrow to remain on schedule  Patient has bruising and edema to face and dried crusted blood in head. Partial thickness abrasions noted to scalp.  Large swollen rounded protrusion to right forehead.  Patient is alert and answers questions with daughter assisting.  Wound type:trauma and venous stasis Pressure Injury POA: NA Measurement: Will assess legs tomorrow.  3 cm rounded protrusion to forehead. Skin tear left forearm.  Wound SFK:CLEXNTZ Drainage (amount, consistency, odor) none Periwound:Bruising to head, arms Dressing procedure/placement/frequency:Unna boots Tuesday and Friday with aquacel AG to wound bed.   Nonadherent silicone layer to forearm skin tear.   Wash hair with dry cap when able.  [place] team will follow.  Domenic Moras MSN, RN, FNP-BC CWON Wound, Ostomy, Continence Nurse Pager [phone]

## 2020-05-30 ENCOUNTER — Other Ambulatory Visit: Payer: Self-pay

## 2020-05-30 ENCOUNTER — Inpatient Hospital Stay: Payer: Medicare Other

## 2020-05-30 LAB — CBC WITH DIFFERENTIAL/PLATELET
Abs Immature Granulocytes: 0.05 10*3/uL (ref 0.00–0.07)
Basophils Absolute: 0 10*3/uL (ref 0.0–0.1)
Basophils Relative: 0 %
Eosinophils Absolute: 0 10*3/uL (ref 0.0–0.5)
Eosinophils Relative: 0 %
HCT: 34.4 % — ABNORMAL LOW (ref 36.0–46.0)
Hemoglobin: 11.8 g/dL — ABNORMAL LOW (ref 12.0–15.0)
Immature Granulocytes: 1 %
Lymphocytes Relative: 9 %
Lymphs Abs: 0.7 10*3/uL (ref 0.7–4.0)
MCH: 31.5 pg (ref 26.0–34.0)
MCHC: 34.3 g/dL (ref 30.0–36.0)
MCV: 91.7 fL (ref 80.0–100.0)
Monocytes Absolute: 0.5 10*3/uL (ref 0.1–1.0)
Monocytes Relative: 6 %
Neutro Abs: 7.2 10*3/uL (ref 1.7–7.7)
Neutrophils Relative %: 84 %
Platelets: 112 10*3/uL — ABNORMAL LOW (ref 150–400)
RBC: 3.75 MIL/uL — ABNORMAL LOW (ref 3.87–5.11)
RDW: 14.9 % (ref 11.5–15.5)
WBC: 8.5 10*3/uL (ref 4.0–10.5)
nRBC: 0 % (ref 0.0–0.2)

## 2020-05-30 LAB — TROPONIN I (HIGH SENSITIVITY)
Troponin I (High Sensitivity): 16 ng/L (ref <18)
Troponin I (High Sensitivity): 14 ng/L (ref <18)

## 2020-05-30 LAB — GLUCOSE, CAPILLARY
Glucose-Capillary: 188 mg/dL — ABNORMAL HIGH (ref 70–99)
Glucose-Capillary: 216 mg/dL — ABNORMAL HIGH (ref 70–99)
Glucose-Capillary: 181 mg/dL — ABNORMAL HIGH (ref 70–99)
Glucose-Capillary: 209 mg/dL — ABNORMAL HIGH (ref 70–99)

## 2020-05-30 LAB — BASIC METABOLIC PANEL
Anion gap: 13 (ref 5–15)
BUN: 29 mg/dL — ABNORMAL HIGH (ref 8–23)
CO2: 28 mmol/L (ref 22–32)
Calcium: 9.2 mg/dL (ref 8.9–10.3)
Chloride: 97 mmol/L — ABNORMAL LOW (ref 98–111)
Creatinine, Ser: 1.39 mg/dL — ABNORMAL HIGH (ref 0.44–1.00)
GFR calc Af Amer: 40 mL/min — ABNORMAL LOW (ref >60)
GFR calc non Af Amer: 34 mL/min — ABNORMAL LOW (ref >60)
Glucose, Bld: 204 mg/dL — ABNORMAL HIGH (ref 70–99)
Potassium: 3.9 mmol/L (ref 3.5–5.1)
Sodium: 138 mmol/L (ref 135–145)

## 2020-05-30 LAB — HEMOGLOBIN A1C
Hgb A1c MFr Bld: 6.1 % — ABNORMAL HIGH (ref 4.8–5.6)
Mean Plasma Glucose: 128 mg/dL

## 2020-05-30 LAB — MAGNESIUM: Magnesium: 1.6 mg/dL — ABNORMAL LOW (ref 1.7–2.4)

## 2020-05-30 LAB — FIBRIN DERIVATIVES D-DIMER (ARMC ONLY): Fibrin derivatives D-dimer (ARMC): 1053.69 ng/mL (FEU) — ABNORMAL HIGH (ref 0.00–499.00)

## 2020-05-30 MED ORDER — INSULIN ASPART 100 UNIT/ML ~~LOC~~ SOLN
0.0000 [IU] | Freq: Every day | SUBCUTANEOUS | Status: DC
  Administered 2020-05-30: 2 [IU] via SUBCUTANEOUS
  Filled 2020-05-30: qty 1

## 2020-05-30 MED ORDER — MORPHINE SULFATE (PF) 2 MG/ML IV SOLN
1.0000 mg | INTRAVENOUS | Status: DC | PRN
  Administered 2020-05-30: 1 mg via INTRAVENOUS
  Filled 2020-05-30: qty 1

## 2020-05-30 MED ORDER — MORPHINE SULFATE (PF) 2 MG/ML IV SOLN
2.0000 mg | INTRAVENOUS | Status: DC | PRN
  Administered 2020-05-30 – 2020-05-31 (×3): 2 mg via INTRAVENOUS
  Filled 2020-05-30 (×3): qty 1

## 2020-05-30 MED ORDER — NITROGLYCERIN 0.4 MG SL SUBL
0.4000 mg | SUBLINGUAL_TABLET | SUBLINGUAL | Status: DC | PRN
  Administered 2020-05-30 (×2): 0.4 mg via SUBLINGUAL
  Filled 2020-05-30: qty 1

## 2020-05-30 NOTE — Progress Notes (Signed)
Pt started to develop chest pain radiating to left arm, abdominal pain, and SOB around 0500 this am. VS were stable. Pt's O2 stats were 100% on 2L. NP notified. Orders were placed to do an EKG, and give nitro. Two nitros were given until chest pain was relieved. Pt has been, and continues to be in NSR. Pt is now resting comfortably in bed. Will continue to monitor pt.

## 2020-05-30 NOTE — Plan of Care (Signed)
Continuing with plan of care. 

## 2020-05-30 NOTE — Progress Notes (Signed)
PROGRESS NOTE    Sabrina Small  ALP:[id number] DOB: 08-Jun-1933 DOA: 05/12/2020 PCP: Orvis Brill, [facility]    Brief Narrative:  Sabrina Small is a 86 y.o. female with medical history significant for congestive heart failure, diabetes mellitus, arthritis, depression and diverticulitis who was brought into the emergency room by EMS for evaluation after a fall.  Patient was seen in the emergency room 24 hours prior for a mechanical fall and at that time had a skin tear to her left wrist and a contusion on her forehead.  However since that time she has developed severe swelling and bruising all around her forehead and eyes to the point that her eyes are swollen shut.  Patient states that she got up to go to the bathroom and was trying to reach for her walker when she fell.  She is not certain what she hit but has a large laceration on her forehead.  She does not believe she lost consciousness and denies having any dizziness or lightheadedness.  She complains of pain in her right ankle and left wrist. She denies having any chest pain, shortness of breath, nausea, vomiting, dizziness or lightheadedness. Right ankle x-ray shows findings suspicious for acute dorsal talar avulsion fracture. No other acute fracture of the right ankle. CT scan of cervical spine shows persistent large right frontal scalp hematoma without skull Fracture. Right periorbital laceration/soft tissue swelling without intraorbital abnormality. Increased bilateral facial edema. No facial fracture.      Consultants:  Orthopedics, ophthalmology  Procedures:   Antimicrobials:       Subjective: Pt mumbling this am. Difficult to understand her as she is mumbling quietly  Objective: Vitals:   05/30/20 0500 05/30/20 0518 05/30/20 0527 05/30/20 1146  BP: (!) 158/78 (!) 163/74 (!) 134/48 (!) 152/61  Pulse:  72 75 97  Resp:    15  Temp:    98 F (36.7 C)  TempSrc:    Oral  SpO2:    100%  Weight:      Height:         Intake/Output Summary (Last 24 hours) at 05/30/2020 1605 Last data filed at 05/30/2020 1005 Gross per 24 hour  Intake 456 ml  Output 2000 ml  Net -1544 ml   Filed Weights   05/29/2020 0255  Weight: 71.7 kg    Examination:  General exam: Appears calm and comfortable , nad, confused Heent: facial swelling, eyes shot. ecchymosis Respiratory system: Clear to auscultation. With poor respiratory effort normal.no r/r/w Cardiovascular system: S1 & S2 heard, RRR. No JVD, murmurs, rubs, gallops or clicks.  Gastrointestinal system: Abdomen is nondistended, soft and nontender. Normal bowel sounds heard. Central nervous system: unable to assess Extremities: no edema Skin: warm, dry Psychiatry:  Mood & affect appropriate in current setting.     Data Reviewed: I have personally reviewed following labs and imaging studies  CBC: Recent Labs  Lab 05/29/2020 0320 05/29/20 0623 05/30/20 0505  WBC 5.3 6.4 8.5  NEUTROABS 3.1  --  7.2  HGB 12.7 11.2* 11.8*  HCT 36.9 33.2* 34.4*  MCV 91.6 92.0 91.7  PLT 122* 109* 353*   Basic Metabolic Panel: Recent Labs  Lab 05/09/2020 0320 05/29/20 0623 05/30/20 0505  NA 139 138 138  K 3.3* 3.4* 3.9  CL 99 98 97*  CO2 29 26 28   GLUCOSE 117* 176* 204*  BUN 32* 32* 29*  CREATININE 1.53* 1.52* 1.39*  CALCIUM 9.2 8.9 9.2  MG  --   --  1.6*  GFR: Estimated Creatinine Clearance: 25.5 mL/min (A) (by C-G formula based on SCr of 1.39 mg/dL (H)). Liver Function Tests: Recent Labs  Lab 05/27/2020 0320  AST 29  ALT 16  ALKPHOS 62  BILITOT 1.0  PROT 6.8  ALBUMIN 4.0   No results for input(s): LIPASE, AMYLASE in the last 168 hours. No results for input(s): AMMONIA in the last 168 hours. Coagulation Profile: Recent Labs  Lab 05/07/2020 0320  INR 1.2   Cardiac Enzymes: Recent Labs  Lab 05/30/2020 0320  CKTOTAL 109   BNP (last 3 results) No results for input(s): PROBNP in the last 8760 hours. HbA1C: Recent Labs    05/11/2020 0320  HGBA1C  6.1*   CBG: Recent Labs  Lab 05/29/20 1147 05/29/20 1659 05/29/20 2016 05/30/20 0803 05/30/20 1143  GLUCAP 159* 165* 196* 188* 216*   Lipid Profile: No results for input(s): CHOL, HDL, LDLCALC, TRIG, CHOLHDL, LDLDIRECT in the last 72 hours. Thyroid Function Tests: No results for input(s): TSH, T4TOTAL, FREET4, T3FREE, THYROIDAB in the last 72 hours. Anemia Panel: No results for input(s): VITAMINB12, FOLATE, FERRITIN, TIBC, IRON, RETICCTPCT in the last 72 hours. Sepsis Labs: Recent Labs  Lab 06/04/2020 0320 05/17/2020 0412  PROCALCITON <0.10  --   LATICACIDVEN  --  1.5    Recent Results (from the past 240 hour(s))  SARS Coronavirus 2 by RT PCR (hospital order, performed in [facility] hospital lab) Nasopharyngeal Nasopharyngeal Swab     Status: None   Collection Time: 05/29/2020  4:12 AM   Specimen: Nasopharyngeal Swab  Result Value Ref Range Status   SARS Coronavirus 2 NEGATIVE NEGATIVE Final    Comment: (NOTE) SARS-CoV-2 target nucleic acids are NOT DETECTED.  The SARS-CoV-2 RNA is generally detectable in upper and lower respiratory specimens during the acute phase of infection. The lowest concentration of SARS-CoV-2 viral copies this assay can detect is 250 copies / mL. A negative result does not preclude SARS-CoV-2 infection and should not be used as the sole basis for treatment or other patient management decisions.  A negative result may occur with improper specimen collection / handling, submission of specimen other than nasopharyngeal swab, presence of viral mutation(s) within the areas targeted by this assay, and inadequate number of viral copies (<250 copies / mL). A negative result must be combined with clinical observations, patient history, and epidemiological information.  Fact Sheet for Patients:   StrictlyIdeas.no  Fact Sheet for Healthcare Providers: BankingDealers.co.za  This test is not yet approved or   cleared by the [place] FDA and has been authorized for detection and/or diagnosis of SARS-CoV-2 by FDA under an Emergency Use Authorization (EUA).  This EUA will remain in effect (meaning this test can be used) for the duration of the COVID-19 declaration under Section 564(b)(1) of the Act, 21 U.S.C. section 360bbb-3(b)(1), unless the authorization is terminated or revoked sooner.  Performed at [facility], [address] [place], [place] [address]          Radiology Studies: DG Chest 1 View  Result Date: 05/30/2020 CLINICAL DATA:  Shortness of breath. EXAM: CHEST  1 VIEW COMPARISON:  One-view chest x-ray 09/01/2018 FINDINGS: The heart is enlarged. Atherosclerotic changes are noted at the aortic arch. Moderate pulmonary vascular congestion is present. Lung volumes are low. Small effusions are suspected, left greater than right. Minimal atelectasis is noted without other airspace disease. IMPRESSION: Cardiomegaly with moderate pulmonary vascular congestion and small effusions compatible with congestive heart failure. Electronically Signed   By: Harrell Gave  Mattern M.D.   On: 05/30/2020 05:27   CT HEAD WO CONTRAST  Result Date: 05/29/2020 CLINICAL DATA:  Fall.  Altered mental status. EXAM: CT HEAD WITHOUT CONTRAST TECHNIQUE: Contiguous axial images were obtained from the base of the skull through the vertex without intravenous contrast. COMPARISON:  CT head yesterday. FINDINGS: Brain: Generalized atrophy without hydrocephalus. Mild chronic microvascular ischemic change in the white matter. Negative for acute infarct, hemorrhage, mass. Vascular: Negative for hyperdense vessel Skull: Negative for skull fracture. Moderate to large right frontal scalp hematoma with laceration unchanged from yesterday. Sinuses/Orbits: Paranasal sinuses clear. Bilateral cataract extraction. Other: None IMPRESSION: No acute intracranial abnormality and no change from yesterday Moderate to large  right frontal scalp hematoma unchanged. Electronically Signed   By: Franchot Gallo M.D.   On: 05/29/2020 16:58        Scheduled Meds: . allopurinol  100 mg Oral Daily  . amLODipine  10 mg Oral Daily  . buPROPion  150 mg Oral Daily  . DULoxetine  20 mg Oral Daily  . erythromycin   Both Eyes QHS  . fenofibrate  54 mg Oral Daily  . insulin aspart  0-15 Units Subcutaneous TID WC  . levothyroxine  75 mcg Oral QAC breakfast  . linaclotide  145 mcg Oral QAC breakfast  . metoprolol succinate  50 mg Oral Daily  . mometasone-formoterol  2 puff Inhalation BID  . nystatin cream  1 application Topical BID  . omega-3 acid ethyl esters  1 g Oral Daily  . pantoprazole  40 mg Oral Daily  . pravastatin  40 mg Oral q1800  . senna  2 tablet Oral Daily  . sodium chloride flush  3 mL Intravenous Q12H  . torsemide  60 mg Oral Daily  . [START ON 06/05/2020] Vitamin D (Ergocalciferol)  50,000 Units Oral Q30 days   Continuous Infusions: . sodium chloride      Assessment & Plan:   Principal Problem:   Recurrent falls Active Problems:   Hypothyroidism   Chronic ITP (idiopathic thrombocytopenia) (HCC)   Essential hypertension   Closed right ankle fracture   Hypokalemia   Chronic diastolic CHF (congestive heart failure) (HCC)   Pressure injury of skin  Mechanical Recurrent falls She currently has a scalp laceration, significant periorbital swelling and ecchymosis as well as a right ankle fracture Place patient on fall precautions PT/OT  Periorbital Contusion/Eccymosis/Laceration Ophthalmology was consulted, input was appreciated. Per ophthalmology-patient's eyes were essentially spared.  Eye exam benign the subconjunctival hemorrhage will resolve in 1-2 weeks.   No evidence of infection, however with her other injuries, it would be prudent and reasonable to apply antibiotic ointment to her eyes nightly to lubricate her eyes and prevent infection, so long as she is not allergic to said  ointment.  Based on her chart, it appears she has no known allergy to erythromycin ointment. small strip of erythromycin ointment be applied in both eyes at bedtime.  Does not tolerate recommend gel eyedrop Systane follow-up with an eye doctor as an outpatient within 1-2 weeks of her hospital discharge with ophthalmology Dr. Neville Route.   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 Ortho consulted , input appreciated-will be fitted with a cam walker boot,Once this is in place, the patient may be mobilized bed to chair with physical therapy, weightbearing as tolerated on the right foot.  Meanwhile, she is to continue to have ice applied to the ankle and to keep the foot elevated.    Hypertension Blood pressure stable Continue metoprolol and amlodipine   Depression Continue Wellbutrin   Chronic diastolic dysfunction CHF-euvolemic on exam.  Without exacerbation. Last known LVEF is 60 to 65% Continue torsemide and metoprolol    DVT prophylaxis: SCD Code Status: DO NOT RESUSCITATE Family Communication: updated daughter who is POA about findings, plans, etc. Disposition Plan: Back to previous home environment vs rehab Barrier: needs PT/OT evaluation when able to after cam walker boot fitted. Anticipated d/c: TBD as medically not stable.         LOS: 2 days   Time spent: 45 min with >50% on coc   Nolberto Hanlon, MD [facility] Pager [phone]  If 7PM-7AM, please contact night-coverage [email or web address] Password [facility] 05/30/2020, 4:05 PM

## 2020-05-30 NOTE — Progress Notes (Addendum)
Inpatient Diabetes Program Recommendations  AACE/ADA: New Consensus Statement on Inpatient Glycemic Control (2015)  Target Ranges:  Prepandial:   less than 140 mg/dL      Peak postprandial:   less than 180 mg/dL (1-2 hours)      Critically ill patients:  140 - 180 mg/dL   Lab Results  Component Value Date   GLUCAP 216 (H) 05/30/2020   HGBA1C 6.1 (H) 05/30/2020    Review of Glycemic Control Results for Sabrina Small, Sabrina Small (MRN [id number]) as of 05/30/2020 12:45  Ref. Range 05/29/2020 07:46 05/29/2020 11:47 05/29/2020 16:59 05/29/2020 20:16 05/30/2020 08:03 05/30/2020 11:43  Glucose-Capillary Latest Ref Range: 70 - 99 mg/dL 152 (H) 159 (H) 165 (H) 196 (H) 188 (H) 216 (H)   Diabetes history: DM 2 Outpatient Diabetes medications:  Levemir 40 units q HS Novolog 8 units tid with meals  Current orders for Inpatient glycemic control:  Novolog moderate tid with meals Inpatient Diabetes Program Recommendations:   Please consider adding Levemir 20 units q HS.    Thanks,  Adah Perl, RN, BC-ADM Inpatient Diabetes Coordinator Pager [phone] (8a-5p)

## 2020-05-30 NOTE — Care Management Important Message (Signed)
Important Message  Patient Details  Name: Sabrina Small MRN: [id number] Date of Birth: 29-Nov-1933   Medicare Important Message Given:  Yes     Dannette Barbara 05/30/2020, 12:56 PM

## 2020-05-30 NOTE — Consult Note (Signed)
[place] Nurse wound follow up Wound type:Apply Unna boot to left lower leg.  Right ankle Fx and in stabilizer boot. No wounds to right leg. Left leg with 3 venous stasis ulcers Measurement:3 wounds  1 cm x 1 cm scabbed lesions to left anterior lower leg.  WOund center was applying hydrofera blue to open wounds.   Will implement aquacel AG to open wounds and Unna boot to left leg. Wound AXK:PVVZSMO Drainage (amount, consistency, odor) minimal serosanguinous  No odor Periwound: erythema  Chronic skin changes Dressing procedure/placement/frequency: CLeanse left leg with soap and water and pat dry.  Apply Aquacel Ag to open wounds.  Cover with zinc layer from below toes to below knee. Secure with self adherent coban.  Change twice weekly on Tuesday and Friday.  [place] team will follow.   Domenic Moras MSN, RN, FNP-BC CWON Wound, Ostomy, Continence Nurse Pager [phone] :          Note Details  Author Jeannie Fend, FNP File Time 05/30/2020 11:32 AM  Author Type Family Nurse Practitioner Status Signed  Last Editor Jeannie Fend, FNP Service [place] # [phone] Admit Date 05/27/2020

## 2020-05-30 NOTE — Progress Notes (Signed)
Subjective: No new complaints regarding her right foot and ankle.  Objective: Vital signs in last 24 hours: Temp:  [97.8 F (36.6 C)-98.9 F (37.2 C)] 98.9 F (37.2 C) (06/25 0426) Pulse Rate:  [67-75] 75 (06/25 0527) Resp:  [15-16] 16 (06/25 0426) BP: (126-166)/(48-78) 134/48 (06/25 0527) SpO2:  [86 %-100 %] 86 % (06/25 0426)  Intake/Output from previous day: 06/24 0701 - 06/25 0700 In: 100 [P.O.:100] Out: 1100 [Urine:1100] Intake/Output this shift: No intake/output data recorded.  Recent Labs    05/18/2020 0320 05/29/20 0623 05/30/20 0505  HGB 12.7 11.2* 11.8*   Recent Labs    05/29/20 0623 05/30/20 0505  WBC 6.4 8.5  RBC 3.61* 3.75*  HCT 33.2* 34.4*  PLT 109* 112*   Recent Labs    05/29/20 0623 05/30/20 0505  NA 138 138  K 3.4* 3.9  CL 98 97*  CO2 26 28  BUN 32* 29*  CREATININE 1.52* 1.39*  GLUCOSE 176* 204*  CALCIUM 8.9 9.2   Recent Labs    05/25/2020 0320  INR 1.2    Physical Exam: Her examination findings are unchanged versus yesterday.  The splint appears to be in good condition.  She is able to dorsiflex and plantarflex her toes.  The swelling appears to be improving.  Assessment: Right ankle sprain with minimally displaced avulsion fracture off the dorsum of right talar head/neck region.  Plan: The patient was unable to tolerate having her foot put into the cam walker boot last evening, so she was placed back into the posterior splint per her request.  We will wait a few days to a week and try again.  Meanwhile, she is to remain nonweightbearing on the right foot, using a walker for balance and support.  She is to keep the foot elevated and to have ice applied frequently.   Sabrina Small Sabrina Small 05/30/2020, 7:47 AM

## 2020-05-30 NOTE — Progress Notes (Signed)
PT Cancellation Note  Patient Details Name: Sabrina Small MRN: [id number] DOB: 1933-02-21   Cancelled Treatment:    Reason Eval/Treat Not Completed: Fatigue/lethargy limiting ability to participate (Consult received and chart reviewed.  Per primary RN, patient generally lethargic and unable to participate with evaluation at this time.  Recommends hold with re-attempt next date as medically appropriate.)   Leona Pressly H. Owens Shark, PT, DPT, NCS 05/30/20, 1:27 PM [phone]

## 2020-05-30 NOTE — Progress Notes (Addendum)
    BRIEF OVERNIGHT PROGRESS REPORT    SUBJECTIVE:Pt  Complained of chest pain radiating to left arm, abdominal pain, and SOB around 0500 this am. No associated symptoms of nausea or vomiting, dizziness, palpitation, or diaphoresis. Pt's O2 stats were 100% on 2L. Nitro administered with some relief.  OBJECTIVE: She remained afebrile with blood pressure 158/78 mm Hg and pulse rate 81 beats/min. There were no focal neurological deficits; she was alert and oriented x4.  ASSESSMENT/PLAN: Atypical chest pain (No dynamic EKG changes with normal cardiac enzymes)  - Check CBC, BMP, Mag and D-dimer given also risk factor for PE. Consider further evaluation for possible PE if D-dimer elevated and if clinically appropriate. - Trend troponin - Consider Beta-blocker: Metoprolol 12.5mg  PO bid  - NTG + morphine PRN chest pain or NTG drip if ongoing chest pain  - Supplemental O2 as needed to maintain O2 saturations 88 to 92% - Follow intermittent ABG and chest x-ray as needed - Repeat CXR on 6/25 with moderate vascular congestion        Rufina Falco, DNP, CCRN, FNP-C [facility] Nurse Practitioner Between 7pm to 7am - Pager [phone]  After 7am go to [email or web address] - password:[email or web address]  [facility] [employer]  [phone]

## 2020-05-30 NOTE — Evaluation (Signed)
Occupational Therapy Evaluation Patient Details Name: Sabrina Small MRN: [id number] DOB: Sep 14, 1933 Today's Date: 05/30/2020    History of Present Illness 86 y/o female who presents s/p fall (6/23) with a laceration to the forehead and right ankle sprain with minimally displaced avulsion fracture off the dorsum of right talar head/neck region. Pt presented to the emergency room 24 hours prior (6/22) for previous mechanical fall resulting in left wrist skin tear and contusion of the forehead. PMH includes congestive heart failure, diabetes mellitus, arthritis, depression and diverticulitis.   Clinical Impression   Sabrina Small presents this morning agreeable to session with significant facial bruising and b/l eyes swollen (R > L). Her RLE is NWB and in a splint; L wrist bandaged. Per secure MD chat prior to session, pt bedrest order discontinued. She began to c/o nausea and increased chest pain with notable wheezing during evaluation w/o movement, RN notified promptly and HR noted to be in 80s, SpO2 >94% throughout on 2L O2. Attempted to reposition pt for optimal safety/comfort, elevated LUE to reduce pain/swelling, elevated HOB and provided her with a cold washcloth and emesis bag. Pt reported her pain to be in 10/10; functional mobility and further evaluation of ADL deferred this date. RN notified and arrived with meds.   Prior to hospitalization, pt lived at [facility] in an apartment with an Media planner, walk-in shower, shower chair, comfort height toilet and grab bars at toilet/shower. She uses a Radiation protection practitioner for community ambulation, PRN in her home. She reports independence in ADL (some difficulties stepping into the shower), though has had 4 falls in the past year (including 2 this week). Facility assists with med mgt, cooking and cleaning. Pt no longer drives. She presents with significant pain, decreased activity tolerance, general weakness and decreased functional engagement. Pt will  benefit from skilled acute OT services while in house to address these concerns to promote increased independence and safety during ADL. Recommend transition to SNF upon discharge to maximize pt outcomes.       Follow Up Recommendations  SNF    Equipment Recommendations  Other (comment) (TBD at next venue)    Recommendations for Other Services       Precautions / Restrictions Precautions Precautions: Fall Required Braces or Orthoses: Other Brace Other Brace: Cam Boot Restrictions Weight Bearing Restrictions: Yes RLE Weight Bearing: Non weight bearing Other Position/Activity Restrictions: Per secure chat with MD, pt cleared to discontinue bedrest order. RLE NWB without Cam Boot (pt unable to tolerate boot at this time)      Mobility Bed Mobility Overal bed mobility: Needs Assistance             General bed mobility comments: pt unable to tolerate bed mobility or repositioning due to pain and nausea  Transfers                 General transfer comment: unable to attempt this session    Balance Overall balance assessment: History of Falls (unable to assess)                                         ADL either performed or assessed with clinical judgement   ADL Overall ADL's : Needs assistance/impaired  General ADL Comments: Unable to attempt functional mobility or ADL this session 2/2 pt and nausea throughout session. Pt was able to hold cup with RUE and drink water through a straw with Setup A.     Vision Baseline Vision/History: No visual deficits Patient Visual Report: Other (comment) (b/l swelling around eyes limiting vision at this time) Additional Comments: R eye swollen shut > L eye     Perception     Praxis      Pertinent Vitals/Pain Pain Assessment: 0-10 Pain Score: 10-Worst pain ever Pain Location: stomach, left wrist, back Pain Descriptors / Indicators:  Aching;Discomfort;Grimacing;Guarding;Moaning Pain Intervention(s): Limited activity within patient's tolerance;Monitored during session;Repositioned;Patient requesting pain meds-RN notified;RN gave pain meds during session;Utilized relaxation techniques     Hand Dominance Right   Extremity/Trunk Assessment Upper Extremity Assessment Upper Extremity Assessment: LUE deficits/detail (unable to assess 2/2 pain and nausea) LUE Deficits / Details: skin tear on L wrist, bandaged on arrival   Lower Extremity Assessment Lower Extremity Assessment: Defer to PT evaluation;RLE deficits/detail (unable to assess 2/2 pain and nausea) RLE Deficits / Details: NWB RLE RLE: Unable to fully assess due to immobilization       Communication Communication Communication: No difficulties;Other (comment) (soft-spoken and increased effort for speaking, likely due to facial swelling/bruising)   Cognition Arousal/Alertness: Awake/alert Behavior During Therapy: WFL for tasks assessed/performed Overall Cognitive Status: Within Functional Limits for tasks assessed                                 General Comments: pt A&O x4, appropriate conversation throughout, further assessment limited by pt pain and nausea   General Comments  During session, pt c/o increasing chest pain, saying "it feels like a knot." RN promptly notified and HR noted to be in 80s throughout. Pt also c/o feeling nauseated at start of session. Attempted to reposition pt for optimal safety and comfort, provided with emesis bag and cold washcloth and elevated HOB for increased safety. RN and NT notified, RN arrived with medication.    Exercises Other Exercises Other Exercises: Repositioned and elevated pt LUE to reduce swelling and increase comfort Other Exercises: Educated pt on calming strategies and deep breathing   Shoulder Instructions      Home Living Family/patient expects to be discharged to:: Assisted living                              Home Equipment: Cane - single point;Grab bars - toilet;Grab bars - tub/shower;Shower seat;Walker - 4 wheels   Additional Comments: pt lives in an apartment at [facility], facility has an Media planner, she has an elevated toilet and walk-in shower with small step-over ledge      Prior Functioning/Environment Level of Independence: Needs assistance  Gait / Transfers Assistance Needed: uses a 4WW for community ambulation and sometimes within her home, 4 falls in the past year (including 2 this week) ADL's / Homemaking Assistance Needed: independent in ADL, receives assist for med management from RN at facility, facility provides cooking and cleaning, pt no longer drives            OT Problem List: Decreased strength;Decreased range of motion;Decreased activity tolerance;Impaired balance (sitting and/or standing);Decreased knowledge of precautions;Decreased knowledge of use of DME or AE;Impaired UE functional use;Cardiopulmonary status limiting activity;Pain;Increased edema      OT Treatment/Interventions: Self-care/ADL training;Therapeutic exercise;Therapeutic activities;Energy conservation;DME and/or  AE instruction;Patient/family education;Balance training    OT Goals(Current goals can be found in the care plan section) Acute Rehab OT Goals Patient Stated Goal: "To get out of here" OT Goal Formulation: With patient Time For Goal Achievement: 06/13/20 Potential to Achieve Goals: Good  OT Frequency: Min 2X/week   Barriers to D/C:            Co-evaluation              AM-PAC OT "6 Clicks" Daily Activity     Outcome Measure Help from another person eating meals?: A Little Help from another person taking care of personal grooming?: A Little Help from another person toileting, which includes using toliet, bedpan, or urinal?: Total Help from another person bathing (including washing, rinsing, drying)?: A Lot Help from another person to put on  and taking off regular upper body clothing?: A Lot Help from another person to put on and taking off regular lower body clothing?: Total 6 Click Score: 12   End of Session Nurse Communication: Mobility status;Patient requests pain meds;Precautions;Weight bearing status;Other (comment) (chest pain and nausea)  Activity Tolerance: Patient limited by pain Patient left: in bed;with call bell/phone within reach;with bed alarm set;with nursing/sitter in room  OT Visit Diagnosis: History of falling (Z91.81);Repeated falls (R29.6);Muscle weakness (generalized) (M62.81);Other abnormalities of gait and mobility (R26.89);Pain Pain - Right/Left: Left (Right Leg, Stomach, Back) Pain - part of body: Hand                Time: 7124-5809 OT Time Calculation (min): 38 min Charges:  OT General Charges $OT Visit: 1 Visit OT Evaluation $OT Eval Moderate Complexity: 1 Mod OT Treatments $Therapeutic Activity: 8-22 mins  Jerilynn Birkenhead, OTS 05/30/20, 12:15 PM

## 2020-05-31 LAB — BRAIN NATRIURETIC PEPTIDE: B Natriuretic Peptide: 409 pg/mL — ABNORMAL HIGH (ref 0.0–100.0)

## 2020-05-31 LAB — BASIC METABOLIC PANEL
Anion gap: 18 — ABNORMAL HIGH (ref 5–15)
BUN: 36 mg/dL — ABNORMAL HIGH (ref 8–23)
CO2: 24 mmol/L (ref 22–32)
Calcium: 9.3 mg/dL (ref 8.9–10.3)
Chloride: 96 mmol/L — ABNORMAL LOW (ref 98–111)
Creatinine, Ser: 2.56 mg/dL — ABNORMAL HIGH (ref 0.44–1.00)
GFR calc Af Amer: 19 mL/min — ABNORMAL LOW (ref >60)
GFR calc non Af Amer: 16 mL/min — ABNORMAL LOW (ref >60)
Glucose, Bld: 200 mg/dL — ABNORMAL HIGH (ref 70–99)
Potassium: 4.1 mmol/L (ref 3.5–5.1)
Sodium: 138 mmol/L (ref 135–145)

## 2020-05-31 LAB — GLUCOSE, CAPILLARY
Glucose-Capillary: 175 mg/dL — ABNORMAL HIGH (ref 70–99)
Glucose-Capillary: 162 mg/dL — ABNORMAL HIGH (ref 70–99)

## 2020-05-31 MED ORDER — SODIUM CHLORIDE 0.45 % IV SOLN: INTRAVENOUS | Status: DC

## 2020-05-31 MED ORDER — MORPHINE 100MG IN NS 100ML (1MG/ML) PREMIX INFUSION
1.0000 mg/h | INTRAVENOUS | Status: DC
  Administered 2020-05-31: 1 mg/h via INTRAVENOUS
  Filled 2020-05-31: qty 100

## 2020-05-31 MED ORDER — NITROFURANTOIN MONOHYD MACRO 100 MG PO CAPS: 100.0000 mg | ORAL_CAPSULE | Freq: Two times a day (BID) | ORAL | Status: DC

## 2020-05-31 MED ORDER — FOSFOMYCIN TROMETHAMINE 3 G PO PACK
3.0000 g | PACK | Freq: Once | ORAL | Status: DC
  Filled 2020-05-31: qty 3

## 2020-06-05 NOTE — Death Summary Note (Signed)
Death Summary  Sabrina Small:[id number] DOB: 1933/03/10 DOA: 2020/06/10  PCP: Orvis Br[facility], [facility]  Admit date: 10-Jun-2020 Date of Death: 06-13-20 Time of Death: 3:44 PM Notification: [facility], [facility] notified of death of 06/13/2020   History of present illness:  Sabrina Small is a 86 y.o. female with a history of  significant for congestive heart failure, diabetes mellitus, arthritis, depression and diverticulitis who was brought into the emergency room by EMS for evaluation after a fall.  Patient was seen in the emergency room 24 hours prior for a mechanical fall and at that time had a skin tear to her left wrist and a contusion on her forehead.  However since that time she has developed severe swelling and bruising all around her forehead and eyes to the point that her eyes are swollen shut.  Patient stated that she got up to go to the bathroom and was trying to reach for her walker when she fell.Right ankle x-ray showsfindings suspicious for acute dorsal talar avulsion fracture. No other acute fracture of the right ankle. CT scan of cervical spine showspersistent large right frontal scalp hematoma without skull Fracture. Right periorbital laceration/soft tissue swelling without intraorbital abnormality. Increased bilateral facial edema. No facial fracture. Patient found with periorbital contusion ecchymosis and laceration. Ophthalmology was consulted, Per ophthalmology-patient's eyes were essentially spared.  Eye exam benign the subconjunctival hemorrhage should eventually resolve.  There was no evidence of infection, however with her other injuries. Patient was found with Right ankle sprain with minimally displaced avulsion fracture off the dorsum of right talar head/neck region.  Orthopedics was consulted.she was going to be fitted with a cam walker boot,Once this is in place, the patient may be mobilized bed to chair with physical therapy, weightbearing as tolerated  on the right foot. But family did not think she would tolerate the boot as she would fall. While in the hospital patient did not eat anything and family was concerned that she is not eating anything.  She seemed to be worsening per family from her overall status.  Family decided to place patient on comfort measures.  They wanted all blood work discontinued.  We discussed discontinuing all her other medications which they were in agreement and just making patient comfortable with morphine.  Patient passed away today at 3:44 PM.    The results of significant diagnostics from this hospitalization (including imaging, microbiology, ancillary and laboratory) are listed below for reference.    Significant Diagnostic Studies: DG Chest 1 View  Result Date: 05/30/2020 CLINICAL DATA:  Shortness of breath. EXAM: CHEST  1 VIEW COMPARISON:  One-view chest x-ray 09/01/2018 FINDINGS: The heart is enlarged. Atherosclerotic changes are noted at the aortic arch. Moderate pulmonary vascular congestion is present. Lung volumes are low. Small effusions are suspected, left greater than right. Minimal atelectasis is noted without other airspace disease. IMPRESSION: Cardiomegaly with moderate pulmonary vascular congestion and small effusions compatible with congestive heart failure. Electronically Signed   By: San Morelle M.D.   On: 05/30/2020 05:27   DG Wrist Complete Left  Result Date: 06/10/2020 CLINICAL DATA:  Left wrist pain after fall.  Also fall yesterday. EXAM: LEFT WRIST - COMPLETE 3+ VIEW COMPARISON:  None. FINDINGS: No evidence of acute fracture. There is slight dorsal tilt of the lunate. Moderate wrist joint osteoarthritis with joint space narrowing and subchondral cystic change. There is advanced osteoarthritis at the thumb carpal metacarpal joint. No focal soft tissue abnormality. IMPRESSION: 1. No acute fracture or subluxation of  the left wrist. 2. Moderate wrist joint osteoarthritis. Advanced  osteoarthritis at the thumb carpometacarpal joint. 3. Slight dorsal tilt of the lunate appears chronic. Electronically Signed   By: Keith Rake M.D.   On: 05/23/2020 03:29   DG Ankle Complete Right  Result Date: 06/04/2020 CLINICAL DATA:  Right ankle pain after fall.  Also fall yesterday. EXAM: RIGHT ANKLE - COMPLETE 3+ VIEW COMPARISON:  None. FINDINGS: Curvilinear osseous density adjacent to the dorsal talus is suspicious for acute avulsion injury. No other acute fracture. There is a moderate plantar calcaneal spur. Small ankle joint effusion. Generalized soft tissue edema. IMPRESSION: Findings suspicious for acute dorsal talar avulsion fracture. No other acute fracture of the right ankle. Electronically Signed   By: Keith Rake M.D.   On: 05/26/2020 03:32   CT HEAD WO CONTRAST  Result Date: 05/29/2020 CLINICAL DATA:  Fall.  Altered mental status. EXAM: CT HEAD WITHOUT CONTRAST TECHNIQUE: Contiguous axial images were obtained from the base of the skull through the vertex without intravenous contrast. COMPARISON:  CT head yesterday. FINDINGS: Brain: Generalized atrophy without hydrocephalus. Mild chronic microvascular ischemic change in the white matter. Negative for acute infarct, hemorrhage, mass. Vascular: Negative for hyperdense vessel Skull: Negative for skull fracture. Moderate to large right frontal scalp hematoma with laceration unchanged from yesterday. Sinuses/Orbits: Paranasal sinuses clear. Bilateral cataract extraction. Other: None IMPRESSION: No acute intracranial abnormality and no change from yesterday Moderate to large right frontal scalp hematoma unchanged. Electronically Signed   By: Franchot Gallo M.D.   On: 05/29/2020 16:58   CT Head Wo Contrast  Result Date: 05/08/2020 CLINICAL DATA:  Fall EXAM: CT HEAD WITHOUT CONTRAST CT MAXILLOFACIAL WITHOUT CONTRAST CT CERVICAL SPINE WITHOUT CONTRAST TECHNIQUE: Multidetector CT imaging of the head, cervical spine, and maxillofacial  structures were performed using the standard protocol without intravenous contrast. Multiplanar CT image reconstructions of the cervical spine and maxillofacial structures were also generated. COMPARISON:  None. FINDINGS: CT HEAD FINDINGS Brain: There is no mass, hemorrhage or extra-axial collection. The size and configuration of the ventricles and extra-axial CSF spaces are normal. The brain parenchyma is normal, without evidence of acute or chronic infarction. Vascular: No abnormal hyperdensity of the major intracranial arteries or dural venous sinuses. No intracranial atherosclerosis. Skull: Persistent large right frontal scalp hematoma. No skull fracture. CT MAXILLOFACIAL FINDINGS Osseous: --Complex facial fracture types: No LeFort, zygomaticomaxillary complex or nasoorbitoethmoidal fracture. --Simple fracture types: None. --Mandible: No fracture or dislocation. Orbits: Right periorbital laceration/soft tissue swelling. No intraorbital abnormality. Sinuses: No fluid levels or advanced mucosal thickening. Soft tissues: Bilateral facial subcutaneous edema has increased since 05/27/2020. CT CERVICAL SPINE FINDINGS Alignment: No static subluxation. Facets are aligned. Occipital condyles and the lateral masses of C1-C2 are aligned. Skull base and vertebrae: No acute fracture. Soft tissues and spinal canal: No prevertebral fluid or swelling. No visible canal hematoma. Disc levels: Multilevel facet arthrosis. Upper chest: No pneumothorax, pulmonary nodule or pleural effusion. Other: Normal visualized paraspinal cervical soft tissues. IMPRESSION: 1. No acute intracranial abnormality. 2. Persistent large right frontal scalp hematoma without skull fracture. 3. Right periorbital laceration/soft tissue swelling without intraorbital abnormality. 4. Increased bilateral facial edema. 5. No facial fracture. 6. No acute fracture or static subluxation of the cervical spine. Electronically Signed   By: Ulyses Jarred M.D.   On:  05/24/2020 03:58   CT Head Wo Contrast  Result Date: 05/27/2020 CLINICAL DATA:  Fall EXAM: CT HEAD WITHOUT CONTRAST CT MAXILLOFACIAL WITHOUT CONTRAST CT CERVICAL SPINE WITHOUT CONTRAST TECHNIQUE:  Multidetector CT imaging of the head, cervical spine, and maxillofacial structures were performed using the standard protocol without intravenous contrast. Multiplanar CT image reconstructions of the cervical spine and maxillofacial structures were also generated. COMPARISON:  None. FINDINGS: CT HEAD FINDINGS Brain: There is no mass, hemorrhage or extra-axial collection. The size and configuration of the ventricles and extra-axial CSF spaces are normal. The brain parenchyma is normal, without evidence of acute or chronic infarction. Vascular: Atherosclerotic calcification of the internal carotid arteries at the skull base. No abnormal hyperdensity of the major intracranial arteries or dural venous sinuses. Skull: Large right frontal scalp hematoma.  No skull fracture. CT MAXILLOFACIAL FINDINGS Osseous: --Complex facial fracture types: No LeFort, zygomaticomaxillary complex or nasoorbitoethmoidal fracture. --Simple fracture types: None. --Mandible: No fracture or dislocation. Orbits: The globes are intact. Normal appearance of the intra- and extraconal fat. Symmetric extraocular muscles and optic nerves. Sinuses: No fluid levels or advanced mucosal thickening. Soft tissues: Normal visualized extracranial soft tissues. CT CERVICAL SPINE FINDINGS Alignment: Grade 1 anterolisthesis at C4-5 and C5-6 secondary to facet arthrosis. Facets are aligned. Occipital condyles and the lateral masses of C1-C2 are aligned. Skull base and vertebrae: No acute fracture. Soft tissues and spinal canal: No prevertebral fluid or swelling. No visible canal hematoma. Disc levels: Multilevel facet arthrosis. Upper chest: No pneumothorax, pulmonary nodule or pleural effusion. Other: Normal visualized paraspinal cervical soft tissues. IMPRESSION:  1. No acute intracranial abnormality. 2. Large right frontal scalp hematoma without skull fracture or facial fracture. 3. No acute fracture or static subluxation of the cervical spine. Electronically Signed   By: Ulyses Jarred M.D.   On: 05/27/2020 01:45   CT Cervical Spine Wo Contrast  Result Date: 05/24/2020 CLINICAL DATA:  Fall EXAM: CT HEAD WITHOUT CONTRAST CT MAXILLOFACIAL WITHOUT CONTRAST CT CERVICAL SPINE WITHOUT CONTRAST TECHNIQUE: Multidetector CT imaging of the head, cervical spine, and maxillofacial structures were performed using the standard protocol without intravenous contrast. Multiplanar CT image reconstructions of the cervical spine and maxillofacial structures were also generated. COMPARISON:  None. FINDINGS: CT HEAD FINDINGS Brain: There is no mass, hemorrhage or extra-axial collection. The size and configuration of the ventricles and extra-axial CSF spaces are normal. The brain parenchyma is normal, without evidence of acute or chronic infarction. Vascular: No abnormal hyperdensity of the major intracranial arteries or dural venous sinuses. No intracranial atherosclerosis. Skull: Persistent large right frontal scalp hematoma. No skull fracture. CT MAXILLOFACIAL FINDINGS Osseous: --Complex facial fracture types: No LeFort, zygomaticomaxillary complex or nasoorbitoethmoidal fracture. --Simple fracture types: None. --Mandible: No fracture or dislocation. Orbits: Right periorbital laceration/soft tissue swelling. No intraorbital abnormality. Sinuses: No fluid levels or advanced mucosal thickening. Soft tissues: Bilateral facial subcutaneous edema has increased since 05/27/2020. CT CERVICAL SPINE FINDINGS Alignment: No static subluxation. Facets are aligned. Occipital condyles and the lateral masses of C1-C2 are aligned. Skull base and vertebrae: No acute fracture. Soft tissues and spinal canal: No prevertebral fluid or swelling. No visible canal hematoma. Disc levels: Multilevel facet arthrosis.  Upper chest: No pneumothorax, pulmonary nodule or pleural effusion. Other: Normal visualized paraspinal cervical soft tissues. IMPRESSION: 1. No acute intracranial abnormality. 2. Persistent large right frontal scalp hematoma without skull fracture. 3. Right periorbital laceration/soft tissue swelling without intraorbital abnormality. 4. Increased bilateral facial edema. 5. No facial fracture. 6. No acute fracture or static subluxation of the cervical spine. Electronically Signed   By: Ulyses Jarred M.D.   On: 05/23/2020 03:58   CT CERVICAL SPINE WO CONTRAST  Result Date: 05/27/2020 CLINICAL DATA:  Fall EXAM: CT HEAD WITHOUT CONTRAST CT MAXILLOFACIAL WITHOUT CONTRAST CT CERVICAL SPINE WITHOUT CONTRAST TECHNIQUE: Multidetector CT imaging of the head, cervical spine, and maxillofacial structures were performed using the standard protocol without intravenous contrast. Multiplanar CT image reconstructions of the cervical spine and maxillofacial structures were also generated. COMPARISON:  None. FINDINGS: CT HEAD FINDINGS Brain: There is no mass, hemorrhage or extra-axial collection. The size and configuration of the ventricles and extra-axial CSF spaces are normal. The brain parenchyma is normal, without evidence of acute or chronic infarction. Vascular: Atherosclerotic calcification of the internal carotid arteries at the skull base. No abnormal hyperdensity of the major intracranial arteries or dural venous sinuses. Skull: Large right frontal scalp hematoma.  No skull fracture. CT MAXILLOFACIAL FINDINGS Osseous: --Complex facial fracture types: No LeFort, zygomaticomaxillary complex or nasoorbitoethmoidal fracture. --Simple fracture types: None. --Mandible: No fracture or dislocation. Orbits: The globes are intact. Normal appearance of the intra- and extraconal fat. Symmetric extraocular muscles and optic nerves. Sinuses: No fluid levels or advanced mucosal thickening. Soft tissues: Normal visualized extracranial  soft tissues. CT CERVICAL SPINE FINDINGS Alignment: Grade 1 anterolisthesis at C4-5 and C5-6 secondary to facet arthrosis. Facets are aligned. Occipital condyles and the lateral masses of C1-C2 are aligned. Skull base and vertebrae: No acute fracture. Soft tissues and spinal canal: No prevertebral fluid or swelling. No visible canal hematoma. Disc levels: Multilevel facet arthrosis. Upper chest: No pneumothorax, pulmonary nodule or pleural effusion. Other: Normal visualized paraspinal cervical soft tissues. IMPRESSION: 1. No acute intracranial abnormality. 2. Large right frontal scalp hematoma without skull fracture or facial fracture. 3. No acute fracture or static subluxation of the cervical spine. Electronically Signed   By: Ulyses Jarred M.D.   On: 05/27/2020 01:45   CT Maxillofacial Wo Contrast  Result Date: 06/02/2020 CLINICAL DATA:  Fall EXAM: CT HEAD WITHOUT CONTRAST CT MAXILLOFACIAL WITHOUT CONTRAST CT CERVICAL SPINE WITHOUT CONTRAST TECHNIQUE: Multidetector CT imaging of the head, cervical spine, and maxillofacial structures were performed using the standard protocol without intravenous contrast. Multiplanar CT image reconstructions of the cervical spine and maxillofacial structures were also generated. COMPARISON:  None. FINDINGS: CT HEAD FINDINGS Brain: There is no mass, hemorrhage or extra-axial collection. The size and configuration of the ventricles and extra-axial CSF spaces are normal. The brain parenchyma is normal, without evidence of acute or chronic infarction. Vascular: No abnormal hyperdensity of the major intracranial arteries or dural venous sinuses. No intracranial atherosclerosis. Skull: Persistent large right frontal scalp hematoma. No skull fracture. CT MAXILLOFACIAL FINDINGS Osseous: --Complex facial fracture types: No LeFort, zygomaticomaxillary complex or nasoorbitoethmoidal fracture. --Simple fracture types: None. --Mandible: No fracture or dislocation. Orbits: Right periorbital  laceration/soft tissue swelling. No intraorbital abnormality. Sinuses: No fluid levels or advanced mucosal thickening. Soft tissues: Bilateral facial subcutaneous edema has increased since 05/27/2020. CT CERVICAL SPINE FINDINGS Alignment: No static subluxation. Facets are aligned. Occipital condyles and the lateral masses of C1-C2 are aligned. Skull base and vertebrae: No acute fracture. Soft tissues and spinal canal: No prevertebral fluid or swelling. No visible canal hematoma. Disc levels: Multilevel facet arthrosis. Upper chest: No pneumothorax, pulmonary nodule or pleural effusion. Other: Normal visualized paraspinal cervical soft tissues. IMPRESSION: 1. No acute intracranial abnormality. 2. Persistent large right frontal scalp hematoma without skull fracture. 3. Right periorbital laceration/soft tissue swelling without intraorbital abnormality. 4. Increased bilateral facial edema. 5. No facial fracture. 6. No acute fracture or static subluxation of the cervical spine. Electronically Signed   By: Ulyses Jarred M.D.   On:  05/18/2020 03:58   CT MAXILLOFACIAL WO CONTRAST  Result Date: 05/27/2020 CLINICAL DATA:  Fall EXAM: CT HEAD WITHOUT CONTRAST CT MAXILLOFACIAL WITHOUT CONTRAST CT CERVICAL SPINE WITHOUT CONTRAST TECHNIQUE: Multidetector CT imaging of the head, cervical spine, and maxillofacial structures were performed using the standard protocol without intravenous contrast. Multiplanar CT image reconstructions of the cervical spine and maxillofacial structures were also generated. COMPARISON:  None. FINDINGS: CT HEAD FINDINGS Brain: There is no mass, hemorrhage or extra-axial collection. The size and configuration of the ventricles and extra-axial CSF spaces are normal. The brain parenchyma is normal, without evidence of acute or chronic infarction. Vascular: Atherosclerotic calcification of the internal carotid arteries at the skull base. No abnormal hyperdensity of the major intracranial arteries or dural  venous sinuses. Skull: Large right frontal scalp hematoma.  No skull fracture. CT MAXILLOFACIAL FINDINGS Osseous: --Complex facial fracture types: No LeFort, zygomaticomaxillary complex or nasoorbitoethmoidal fracture. --Simple fracture types: None. --Mandible: No fracture or dislocation. Orbits: The globes are intact. Normal appearance of the intra- and extraconal fat. Symmetric extraocular muscles and optic nerves. Sinuses: No fluid levels or advanced mucosal thickening. Soft tissues: Normal visualized extracranial soft tissues. CT CERVICAL SPINE FINDINGS Alignment: Grade 1 anterolisthesis at C4-5 and C5-6 secondary to facet arthrosis. Facets are aligned. Occipital condyles and the lateral masses of C1-C2 are aligned. Skull base and vertebrae: No acute fracture. Soft tissues and spinal canal: No prevertebral fluid or swelling. No visible canal hematoma. Disc levels: Multilevel facet arthrosis. Upper chest: No pneumothorax, pulmonary nodule or pleural effusion. Other: Normal visualized paraspinal cervical soft tissues. IMPRESSION: 1. No acute intracranial abnormality. 2. Large right frontal scalp hematoma without skull fracture or facial fracture. 3. No acute fracture or static subluxation of the cervical spine. Electronically Signed   By: Ulyses Jarred M.D.   On: 05/27/2020 01:45   CT Temporal Bones Wo Contrast  Result Date: 05/22/2020 CLINICAL DATA:  Fall. Hit head on table. Right frontal hematoma. Possible CSF leak. EXAM: CT TEMPORAL BONES WITHOUT CONTRAST TECHNIQUE: Axial and coronal plane CT imaging of the petrous temporal bones was performed with thin-collimation image reconstruction. No intravenous contrast was administered. Multiplanar CT image reconstructions were also generated. COMPARISON:  CT of the head, face, and cervical spine 05/27/2020 and 05/27/2020. FINDINGS: The visualized intracranial contents are within normal limits. Visualized paranasal sinuses are clear. Known right frontal scalp  hematoma is not imaged. Asymmetric stranding is present over the visualized right face extending to the periauricular area on the right without underlying fracture. Vascular calcifications are present without hyperdense vessels The right external auditory canal is within normal limits. The tympanic membrane is visualized and appears to be intact. The middle ear ossicles are normally formed and articulating. Middle ear cavity is clear. Epitympanum unremarkable. The mastoid air cells are clear. Oval window is patent. The inner ear structures are normally formed. The superior semicircular canal is covered. The internal auditory canal and vestibular aqueduct are within normal limits. The left external auditory canal is within normal limits. The tympanic membrane is visualized and appears to be intact. The middle ear ossicles are normally formed and articulating. Middle ear cavity is clear. Epitympanum unremarkable. The mastoid air cells are clear. Oval window is patent. The inner ear structures are normally formed. The superior semicircular canal is covered. The internal auditory canal and vestibular aqueduct are within normal limits. IMPRESSION: 1. Asymmetric stranding over the visualized right face extending to the periauricular area without underlying fracture. 2. Normal CT appearance of the temporal bones  bilaterally. 3. Known right frontal scalp hematoma is not imaged. Electronically Signed   By: San Morelle M.D.   On: 06/04/2020 06:13    Microbiology: Recent Results (from the past 240 hour(s))  SARS Coronavirus 2 by RT PCR (hospital order, performed in [facility] hospital lab) Nasopharyngeal Nasopharyngeal Swab     Status: None   Collection Time: 05/27/2020  4:12 AM   Specimen: Nasopharyngeal Swab  Result Value Ref Range Status   SARS Coronavirus 2 NEGATIVE NEGATIVE Final    Comment: (NOTE) SARS-CoV-2 target nucleic acids are NOT DETECTED.  The SARS-CoV-2 RNA is generally detectable in upper  and lower respiratory specimens during the acute phase of infection. The lowest concentration of SARS-CoV-2 viral copies this assay can detect is 250 copies / mL. A negative result does not preclude SARS-CoV-2 infection and should not be used as the sole basis for treatment or other patient management decisions.  A negative result may occur with improper specimen collection / handling, submission of specimen other than nasopharyngeal swab, presence of viral mutation(s) within the areas targeted by this assay, and inadequate number of viral copies (<250 copies / mL). A negative result must be combined with clinical observations, patient history, and epidemiological information.  Fact Sheet for Patients:   StrictlyIdeas.no  Fact Sheet for Healthcare Providers: BankingDealers.co.za  This test is not yet approved or  cleared by the [place] FDA and has been authorized for detection and/or diagnosis of SARS-CoV-2 by FDA under an Emergency Use Authorization (EUA).  This EUA will remain in effect (meaning this test can be used) for the duration of the COVID-19 declaration under Section 564(b)(1) of the Act, 21 U.S.C. section 360bbb-3(b)(1), unless the authorization is terminated or revoked sooner.  Performed at [facility], [address] [place], [place] [address]      Labs: Basic Metabolic Panel: Recent Labs  Lab 05/12/2020 0320 05/23/2020 0320 05/29/20 6712 05/29/20 4580 05/30/20 0505 Jun 20, 2020 0723  NA 139  --  138  --  138 138  K 3.3*   < > 3.4*   < > 3.9 4.1  CL 99  --  98  --  97* 96*  CO2 29  --  26  --  28 24  GLUCOSE 117*  --  176*  --  204* 200*  BUN 32*  --  32*  --  29* 36*  CREATININE 1.53*  --  1.52*  --  1.39* 2.56*  CALCIUM 9.2  --  8.9  --  9.2 9.3  MG  --   --   --   --  1.6*  --    < > = values in this interval not displayed.   Liver Function Tests: Recent Labs  Lab 05/19/2020 0320  AST 29    ALT 16  ALKPHOS 62  BILITOT 1.0  PROT 6.8  ALBUMIN 4.0   No results for input(s): LIPASE, AMYLASE in the last 168 hours. No results for input(s): AMMONIA in the last 168 hours. CBC: Recent Labs  Lab 05/17/2020 0320 05/29/20 0623 05/30/20 0505  WBC 5.3 6.4 8.5  NEUTROABS 3.1  --  7.2  HGB 12.7 11.2* 11.8*  HCT 36.9 33.2* 34.4*  MCV 91.6 92.0 91.7  PLT 122* 109* 112*   Cardiac Enzymes: Recent Labs  Lab 05/24/2020 0320  CKTOTAL 109   D-Dimer No results for input(s): DDIMER in the last 72 hours. BNP: Invalid input(s): POCBNP CBG: Recent Labs  Lab 05/30/20 1143 05/30/20 1628 05/30/20  2120 2020/06/29 0742 June 29, 2020 1148  GLUCAP 216* 181* 209* 175* 162*   Anemia work up No results for input(s): VITAMINB12, FOLATE, FERRITIN, TIBC, IRON, RETICCTPCT in the last 72 hours. Urinalysis    Component Value Date/Time   COLORURINE YELLOW (A) 05/13/2020 0320   APPEARANCEUR CLOUDY (A) 05/30/2020 0320   LABSPEC 1.011 05/10/2020 0320   PHURINE 5.0 06/03/2020 0320   GLUCOSEU NEGATIVE 05/08/2020 0320   HGBUR SMALL (A) 05/22/2020 0320   BILIRUBINUR NEGATIVE 05/10/2020 0320   KETONESUR NEGATIVE 06/04/2020 0320   PROTEINUR 30 (A) 05/11/2020 0320   NITRITE NEGATIVE 05/09/2020 0320   LEUKOCYTESUR SMALL (A) 05/06/2020 0320   Sepsis Labs Invalid input(s): PROCALCITONIN,  WBC,  LACTICIDVEN     SIGNED:  Nolberto Hanlon, MD  [facility] 29-Jun-2020, 5:37 PM Pager   If 7PM-7AM, please contact night-coverage [email or web address] Password [email or web address]

## 2020-06-05 NOTE — Progress Notes (Signed)
At 3:44pm patient was noted to have no respirations and no apical heart rate confirmed via auscultation times one minute.  Second nurse, Beth Richardson RN, confirmed passing of patient.  Dr. Amery notified and family is at bedside. 

## 2020-06-05 NOTE — Progress Notes (Signed)
[place] paged to rm. to assist family in getting a priest to come pray for pt.  When [facility] arrived, pt. lying down in bed w/dtr. Angela at bedside.  Pt.'s face badly bruised; pt. holding dtr.'s hand.  Dtr. shared pt. suffered two serious falls last weekend --> broke ankle and suffered face/head injury.  Scans have not revealed reason for fall yet --> medical team is continuing to investigate.  Dtr. said pt. was raised [facility] but married a [place]; children are not Catholic but have remained committed Christians.  [place] asked what kind of priestly visit family requests: dtr. thinks Last Rites --> pt.'s condition has declined a lot this week; dtr. has told her it's OK for her to let go but pt. seems to be holding on for something.  Dtr. thinks priest's visit/sacrament may be helpful.  [place] called [facility] and left message.  Will wait for reply and seek to set up visit w/priest sometime today if possible.    June 04, 2020 1053  Clinical Encounter Type  Visited With Patient and family together  Visit Type Initial;Psychological support;Spiritual support;Social support  Referral From Nurse  Spiritual Encounters  Spiritual Needs Ritual  Stress Factors  Patient Stress Factors Health changes;Loss of control  Family Stress Factors Health changes;Loss of control;Major life changes

## 2020-06-05 NOTE — Progress Notes (Addendum)
[place] visited pt.'s rm as follow-up to this morning's visit and to inform family priest is coming later today; [place] encountered pt.'s dtr. [name] at [employer] station; dtr. inquired re: priest and about whether family could stay overnight in rm. w/pt.  Per charge RN, family can stay in rm. if pt. placed on comfort care.  Dtr. said pt. seems to be declining --> aware that palliative care has been consulted; interested in comfort care.  Pt.'s other dtr. [name] came to call [name] to rm. --> pt.'s condition had worsened. Pt. lying in bed, laboring to breathe, moving restlessly in bed.  When RN saw pt.'s condition, palliative care contacted --no answer per weekend--and pt.'s attending physician contacted.  Pt. made comfort care after MD called family.   [facility] called priest back to request sooner visit.  Per family's request, [place] offered prayer for pt. and family at bedside while waiting for priest to arrive.  Dtr. [name] led family in several hymns.  Idelle Crouch arrived and administered blessing and last rites for pt.  [place] stayed w/family a little while and then excused himself to allow family to have time at bedside w/pt.  RN has ordered bereavement tray from cafeteria.  Family is aware of [place] availability if needed; [place] may follow-up later this afternoon/evening if possible.

## 2020-06-05 NOTE — Progress Notes (Signed)
PROGRESS NOTE    Sabrina Small  LEX:[id number] DOB: [birth year] DOA: 05/25/2020 PCP: Orvis Brill, [facility]    Brief Narrative:  Sabrina Small is a 86 y.o. female with medical history significant for congestive heart failure, diabetes mellitus, arthritis, depression and diverticulitis who was brought into the emergency room by EMS for evaluation after a fall.  Patient was seen in the emergency room 24 hours prior for a mechanical fall and at that time had a skin tear to her left wrist and a contusion on her forehead.  However since that time she has developed severe swelling and bruising all around her forehead and eyes to the point that her eyes are swollen shut.  Patient states that she got up to go to the bathroom and was trying to reach for her walker when she fell.  She is not certain what she hit but has a large laceration on her forehead.  She does not believe she lost consciousness and denies having any dizziness or lightheadedness.  She complains of pain in her right ankle and left wrist. She denies having any chest pain, shortness of breath, nausea, vomiting, dizziness or lightheadedness. Right ankle x-ray shows findings suspicious for acute dorsal talar avulsion fracture. No other acute fracture of the right ankle. CT scan of cervical spine shows persistent large right frontal scalp hematoma without skull Fracture. Right periorbital laceration/soft tissue swelling without intraorbital abnormality. Increased bilateral facial edema. No facial fracture.      Consultants:  Orthopedics, ophthalmology  Procedures:   Antimicrobials:       Subjective: Patient's eyes are  open but not eating per family.  Not too interactive.  Objective: Vitals:   05/30/20 1954 05/30/20 2010 13-Jun-2020 0510 Jun 13, 2020 1151  BP: (!) 169/69  (!) 119/55 (!) 113/54  Pulse: 96  92 93  Resp: (!) 22  20 19   Temp: 99.9 F (37.7 C)  98.6 F (37 C) 99.4 F (37.4 C)  TempSrc: Oral  Oral Oral    SpO2: 98% 98% 93% 93%  Weight:      Height:        Intake/Output Summary (Last 24 hours) at 06/13/20 1445 Last data filed at Jun 13, 2020 0649 Gross per 24 hour  Intake --  Output 925 ml  Net -925 ml   Filed Weights   05/27/2020 0255  Weight: 71.7 kg    Examination:  General exam: breathing deeply, eyes open but stairing up. Not too cooperative with exam or answering my questions Heent: facial swelling, eyes shot. ecchymosis Respiratory system: poor resp effort anteriorly, no w/r/r Cardiovascular system: S1 & S2 heard, RRR. No JVD, murmurs, rubs, gallops or clicks.  Gastrointestinal system: Abdomen is nondistended, soft and nontender. Normal bowel sounds heard. Central nervous system: unable to assess Extremities: no edema Skin: warm, dry Psychiatry:  Not able to assess    Data Reviewed: I have personally reviewed following labs and imaging studies  CBC: Recent Labs  Lab 06/03/2020 0320 05/29/20 0623 05/30/20 0505  WBC 5.3 6.4 8.5  NEUTROABS 3.1  --  7.2  HGB 12.7 11.2* 11.8*  HCT 36.9 33.2* 34.4*  MCV 91.6 92.0 91.7  PLT 122* 109* 449*   Basic Metabolic Panel: Recent Labs  Lab 06/02/2020 0320 05/29/20 0623 05/30/20 0505 Jun 13, 2020 0723  NA 139 138 138 138  K 3.3* 3.4* 3.9 4.1  CL 99 98 97* 96*  CO2 29 26 28 24   GLUCOSE 117* 176* 204* 200*  BUN 32* 32* 29* 36*  CREATININE  1.53* 1.52* 1.39* 2.56*  CALCIUM 9.2 8.9 9.2 9.3  MG  --   --  1.6*  --    GFR: Estimated Creatinine Clearance: 13.8 mL/min (A) (by C-G formula based on SCr of 2.56 mg/dL (H)). Liver Function Tests: Recent Labs  Lab 06/03/2020 0320  AST 29  ALT 16  ALKPHOS 62  BILITOT 1.0  PROT 6.8  ALBUMIN 4.0   No results for input(s): LIPASE, AMYLASE in the last 168 hours. No results for input(s): AMMONIA in the last 168 hours. Coagulation Profile: Recent Labs  Lab 05/26/2020 0320  INR 1.2   Cardiac Enzymes: Recent Labs  Lab 06/02/2020 0320  CKTOTAL 109   BNP (last 3 results) No results  for input(s): PROBNP in the last 8760 hours. HbA1C: No results for input(s): HGBA1C in the last 72 hours. CBG: Recent Labs  Lab 05/30/20 1143 05/30/20 1628 05/30/20 2120 06-19-2020 0742 2020/06/19 1148  GLUCAP 216* 181* 209* 175* 162*   Lipid Profile: No results for input(s): CHOL, HDL, LDLCALC, TRIG, CHOLHDL, LDLDIRECT in the last 72 hours. Thyroid Function Tests: No results for input(s): TSH, T4TOTAL, FREET4, T3FREE, THYROIDAB in the last 72 hours. Anemia Panel: No results for input(s): VITAMINB12, FOLATE, FERRITIN, TIBC, IRON, RETICCTPCT in the last 72 hours. Sepsis Labs: Recent Labs  Lab 05/14/2020 0320 05/10/2020 0412  PROCALCITON <0.10  --   LATICACIDVEN  --  1.5    Recent Results (from the past 240 hour(s))  SARS Coronavirus 2 by RT PCR (hospital order, performed in [facility] hospital lab) Nasopharyngeal Nasopharyngeal Swab     Status: None   Collection Time: 05/09/2020  4:12 AM   Specimen: Nasopharyngeal Swab  Result Value Ref Range Status   SARS Coronavirus 2 NEGATIVE NEGATIVE Final    Comment: (NOTE) SARS-CoV-2 target nucleic acids are NOT DETECTED.  The SARS-CoV-2 RNA is generally detectable in upper and lower respiratory specimens during the acute phase of infection. The lowest concentration of SARS-CoV-2 viral copies this assay can detect is 250 copies / mL. A negative result does not preclude SARS-CoV-2 infection and should not be used as the sole basis for treatment or other patient management decisions.  A negative result may occur with improper specimen collection / handling, submission of specimen other than nasopharyngeal swab, presence of viral mutation(s) within the areas targeted by this assay, and inadequate number of viral copies (<250 copies / mL). A negative result must be combined with clinical observations, patient history, and epidemiological information.  Fact Sheet for Patients:   StrictlyIdeas.no  Fact Sheet for  Healthcare Providers: BankingDealers.co.za  This test is not yet approved or  cleared by the [place] FDA and has been authorized for detection and/or diagnosis of SARS-CoV-2 by FDA under an Emergency Use Authorization (EUA).  This EUA will remain in effect (meaning this test can be used) for the duration of the COVID-19 declaration under Section 564(b)(1) of the Act, 21 U.S.C. section 360bbb-3(b)(1), unless the authorization is terminated or revoked sooner.  Performed at [facility], [address], [place], [place] [address]          Radiology Studies: DG Chest 1 View  Result Date: 05/30/2020 CLINICAL DATA:  Shortness of breath. EXAM: CHEST  1 VIEW COMPARISON:  One-view chest x-ray 09/01/2018 FINDINGS: The heart is enlarged. Atherosclerotic changes are noted at the aortic arch. Moderate pulmonary vascular congestion is present. Lung volumes are low. Small effusions are suspected, left greater than right. Minimal atelectasis is noted without other airspace  disease. IMPRESSION: Cardiomegaly with moderate pulmonary vascular congestion and small effusions compatible with congestive heart failure. Electronically Signed   By: San Morelle M.D.   On: 05/30/2020 05:27   CT HEAD WO CONTRAST  Result Date: 05/29/2020 CLINICAL DATA:  Fall.  Altered mental status. EXAM: CT HEAD WITHOUT CONTRAST TECHNIQUE: Contiguous axial images were obtained from the base of the skull through the vertex without intravenous contrast. COMPARISON:  CT head yesterday. FINDINGS: Brain: Generalized atrophy without hydrocephalus. Mild chronic microvascular ischemic change in the white matter. Negative for acute infarct, hemorrhage, mass. Vascular: Negative for hyperdense vessel Skull: Negative for skull fracture. Moderate to large right frontal scalp hematoma with laceration unchanged from yesterday. Sinuses/Orbits: Paranasal sinuses clear. Bilateral cataract extraction. Other:  None IMPRESSION: No acute intracranial abnormality and no change from yesterday Moderate to large right frontal scalp hematoma unchanged. Electronically Signed   By: Franchot Gallo M.D.   On: 05/29/2020 16:58        Scheduled Meds:  Continuous Infusions: . morphine      Assessment & Plan:   Principal Problem:   Recurrent falls Active Problems:   Hypothyroidism   Chronic ITP (idiopathic thrombocytopenia) (HCC)   Essential hypertension   Closed right ankle fracture   Hypokalemia   Chronic diastolic CHF (congestive heart failure) (HCC)   Pressure injury of skin  Mechanical Recurrent falls She currently has a scalp laceration, significant periorbital swelling and ecchymosis as well as a right ankle fracture Place patient on fall precautions PT/OT  Periorbital Contusion/Eccymosis/Laceration Ophthalmology was consulted, input was appreciated. Per ophthalmology-patient's eyes were essentially spared.  Eye exam benign the subconjunctival hemorrhage will resolve in 1-2 weeks.   No evidence of infection, however with her other injuries, it would be prudent and reasonable to apply antibiotic ointment to her eyes nightly to lubricate her eyes and prevent infection, so long as she is not allergic to said ointment.   . small strip of erythromycin ointment be applied in both eyes at bedtime.  Does not tolerate recommend gel eyedrop Systane follow-up with an eye doctor as an outpatient within 1-2 weeks of her hospital discharge with ophthalmology Dr. Neville Route.   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 Ortho consulted , input appreciated-will be fitted with a cam walker boot,Once this is in place, the patient may be mobilized bed to chair with physical therapy, weightbearing as tolerated on the right foot.  Meanwhile, she is to continue to have ice applied to the ankle and to keep the foot elevated.   A/CKD stage IIIb- likely prerenal due to dehydration, torsemide, decreased p.o. intake Family states patient is not taking any p.o. intake at this time Started her on IV fluids gentle for hydration However family now wants patient on hospice/comfort measures DC torsemide  Hypertension Blood pressure stable Continue metoprolol and amlodipine   Depression Continue Wellbutrin   Chronic diastolic dysfunction CHF-euvolemic on exam.  Without exacerbation. Last known LVEF is 60 to 65% Hold torsemide 2/2 AKI    DVT prophylaxis: SCD Code Status: DO NOT RESUSCITATE Family Communication: discussed with all kids at bedside  Disposition:  family has decided on comfort care measures. Discussed stopping all blood draws, feeding since pt does not take po intake anymore. famiy feels she is declining and want comfort care. Discussed starting morphine gtt/iv for comfort and stopping all other meds. Family is in agreement with this plan as they dont want her to suffer anymore and feel she is not responding and declining. They want comfort measures to be started now.  Will d/c all meds and start morphine for comfort measures.       LOS: 3 days   Time spent: 45 min with >50% on coc   Nolberto Hanlon, MD [facility] Pager [phone]  If 7PM-7AM, please contact night-coverage [email or web address] Password [facility] Jun 03, 2020, 2:45 PM

## 2020-06-05 NOTE — Progress Notes (Signed)
PT Cancellation Note  Patient Details Name: SHARYN BRILLIANT MRN: [id number] DOB: [birth year]   Cancelled Treatment:    Reason Eval/Treat Not Completed: Fatigue/lethargy limiting ability to participate.  Alanson Puls, [place] DPT 2020-06-06, 12:17 PM

## 2020-06-05 NOTE — Plan of Care (Signed)
Continuing with plan of care. 

## 2020-06-05 DEATH — deceased

## 2020-06-12 ENCOUNTER — Ambulatory Visit (INDEPENDENT_AMBULATORY_CARE_PROVIDER_SITE_OTHER): Payer: TRICARE For Life (TFL) | Admitting: Nurse Practitioner

## 2020-06-12 ENCOUNTER — Encounter (INDEPENDENT_AMBULATORY_CARE_PROVIDER_SITE_OTHER): Payer: Self-pay

## 2020-06-12 ENCOUNTER — Encounter (INDEPENDENT_AMBULATORY_CARE_PROVIDER_SITE_OTHER): Payer: Medicare Other

## 2021-04-30 ENCOUNTER — Telehealth: Payer: Medicare Other | Admitting: Oncology

## 2022-09-22 NOTE — Telephone Encounter (Signed)
Signing encounter, See previous note 04/29/22
